# Patient Record
Sex: Female | Born: 1965 | Race: Black or African American | Hispanic: No | Marital: Single | Smoking: Current every day smoker
Health system: Southern US, Community
[De-identification: ages and names within clinical notes are randomized; demographics above are authoritative.]

## PROBLEM LIST (undated history)

## (undated) DIAGNOSIS — E042 Nontoxic multinodular goiter: Secondary | ICD-10-CM

## (undated) DIAGNOSIS — I82409 Acute embolism and thrombosis of unspecified deep veins of unspecified lower extremity: Secondary | ICD-10-CM

## (undated) DIAGNOSIS — I10 Essential (primary) hypertension: Secondary | ICD-10-CM

## (undated) DIAGNOSIS — Q0700 Arnold-Chiari syndrome without spina bifida or hydrocephalus: Secondary | ICD-10-CM

## (undated) DIAGNOSIS — B20 Human immunodeficiency virus [HIV] disease: Secondary | ICD-10-CM

## (undated) HISTORY — PX: BRAIN SURGERY: SHX531

## (undated) HISTORY — PX: CHOLECYSTECTOMY: SHX55

## (undated) HISTORY — DX: Human immunodeficiency virus (HIV) disease: B20

## (undated) HISTORY — PX: ABDOMINAL HYSTERECTOMY: SHX81

## (undated) HISTORY — DX: Nontoxic multinodular goiter: E04.2

---

## 1998-04-28 ENCOUNTER — Emergency Department (HOSPITAL_COMMUNITY): Admission: EM | Admit: 1998-04-28 | Discharge: 1998-04-28 | Payer: Self-pay | Admitting: Emergency Medicine

## 1998-04-28 ENCOUNTER — Encounter: Payer: Self-pay | Admitting: Emergency Medicine

## 1998-04-28 ENCOUNTER — Ambulatory Visit (HOSPITAL_COMMUNITY): Admission: RE | Admit: 1998-04-28 | Discharge: 1998-04-28 | Payer: Self-pay | Admitting: Emergency Medicine

## 1998-05-01 ENCOUNTER — Ambulatory Visit (HOSPITAL_COMMUNITY): Admission: RE | Admit: 1998-05-01 | Discharge: 1998-05-01 | Payer: Self-pay | Admitting: Surgery

## 1998-05-03 ENCOUNTER — Ambulatory Visit (HOSPITAL_COMMUNITY): Admission: RE | Admit: 1998-05-03 | Discharge: 1998-05-03 | Payer: Self-pay | Admitting: Internal Medicine

## 1998-05-09 ENCOUNTER — Emergency Department (HOSPITAL_COMMUNITY): Admission: EM | Admit: 1998-05-09 | Discharge: 1998-05-10 | Payer: Self-pay | Admitting: Emergency Medicine

## 1998-05-09 ENCOUNTER — Encounter: Payer: Self-pay | Admitting: Emergency Medicine

## 1999-03-10 ENCOUNTER — Encounter (INDEPENDENT_AMBULATORY_CARE_PROVIDER_SITE_OTHER): Payer: Self-pay | Admitting: Specialist

## 1999-03-10 ENCOUNTER — Other Ambulatory Visit: Admission: RE | Admit: 1999-03-10 | Discharge: 1999-03-10 | Payer: Self-pay | Admitting: Obstetrics and Gynecology

## 1999-03-17 ENCOUNTER — Inpatient Hospital Stay (HOSPITAL_COMMUNITY): Admission: RE | Admit: 1999-03-17 | Discharge: 1999-03-20 | Payer: Self-pay | Admitting: Obstetrics and Gynecology

## 1999-03-17 ENCOUNTER — Encounter (INDEPENDENT_AMBULATORY_CARE_PROVIDER_SITE_OTHER): Payer: Self-pay | Admitting: *Deleted

## 1999-03-17 ENCOUNTER — Other Ambulatory Visit: Admission: RE | Admit: 1999-03-17 | Discharge: 1999-03-17 | Payer: Self-pay | Admitting: *Deleted

## 1999-03-17 ENCOUNTER — Encounter (INDEPENDENT_AMBULATORY_CARE_PROVIDER_SITE_OTHER): Payer: Self-pay | Admitting: Specialist

## 1999-04-04 ENCOUNTER — Emergency Department (HOSPITAL_COMMUNITY): Admission: EM | Admit: 1999-04-04 | Discharge: 1999-04-04 | Payer: Self-pay | Admitting: Emergency Medicine

## 1999-04-04 ENCOUNTER — Encounter: Payer: Self-pay | Admitting: Emergency Medicine

## 1999-05-14 ENCOUNTER — Inpatient Hospital Stay (HOSPITAL_COMMUNITY): Admission: RE | Admit: 1999-05-14 | Discharge: 1999-05-19 | Payer: Self-pay | Admitting: Neurosurgery

## 1999-12-04 ENCOUNTER — Ambulatory Visit (HOSPITAL_COMMUNITY): Admission: RE | Admit: 1999-12-04 | Discharge: 1999-12-04 | Payer: Self-pay | Admitting: Family Medicine

## 2001-10-25 ENCOUNTER — Encounter: Payer: Self-pay | Admitting: Family Medicine

## 2001-10-25 ENCOUNTER — Encounter: Admission: RE | Admit: 2001-10-25 | Discharge: 2001-10-25 | Payer: Self-pay | Admitting: Family Medicine

## 2004-01-10 ENCOUNTER — Encounter (INDEPENDENT_AMBULATORY_CARE_PROVIDER_SITE_OTHER): Payer: Self-pay | Admitting: *Deleted

## 2004-01-10 ENCOUNTER — Encounter: Admission: RE | Admit: 2004-01-10 | Discharge: 2004-01-10 | Payer: Self-pay | Admitting: Infectious Diseases

## 2004-01-10 ENCOUNTER — Ambulatory Visit (HOSPITAL_COMMUNITY): Admission: RE | Admit: 2004-01-10 | Discharge: 2004-01-10 | Payer: Self-pay | Admitting: Infectious Diseases

## 2004-01-10 LAB — CONVERTED CEMR LAB: CD4 Count: 20 microliters

## 2004-02-04 ENCOUNTER — Encounter: Admission: RE | Admit: 2004-02-04 | Discharge: 2004-02-04 | Payer: Self-pay | Admitting: Infectious Diseases

## 2004-02-20 ENCOUNTER — Ambulatory Visit: Payer: Self-pay | Admitting: Infectious Diseases

## 2004-02-26 ENCOUNTER — Ambulatory Visit: Payer: Self-pay | Admitting: Infectious Diseases

## 2004-03-17 ENCOUNTER — Ambulatory Visit: Payer: Self-pay | Admitting: Infectious Diseases

## 2004-03-17 ENCOUNTER — Ambulatory Visit (HOSPITAL_COMMUNITY): Admission: RE | Admit: 2004-03-17 | Discharge: 2004-03-17 | Payer: Self-pay | Admitting: Infectious Diseases

## 2004-05-20 ENCOUNTER — Ambulatory Visit: Payer: Self-pay | Admitting: Internal Medicine

## 2004-05-28 ENCOUNTER — Ambulatory Visit (HOSPITAL_COMMUNITY): Admission: RE | Admit: 2004-05-28 | Discharge: 2004-05-28 | Payer: Self-pay | Admitting: Infectious Diseases

## 2004-05-28 ENCOUNTER — Ambulatory Visit: Payer: Self-pay | Admitting: Infectious Diseases

## 2004-07-28 ENCOUNTER — Ambulatory Visit (HOSPITAL_COMMUNITY): Admission: RE | Admit: 2004-07-28 | Discharge: 2004-07-28 | Payer: Self-pay | Admitting: Infectious Diseases

## 2004-07-28 ENCOUNTER — Ambulatory Visit: Payer: Self-pay | Admitting: Infectious Diseases

## 2004-10-29 ENCOUNTER — Ambulatory Visit (HOSPITAL_COMMUNITY): Admission: RE | Admit: 2004-10-29 | Discharge: 2004-10-29 | Payer: Self-pay | Admitting: Infectious Diseases

## 2004-10-29 ENCOUNTER — Ambulatory Visit: Payer: Self-pay | Admitting: Infectious Diseases

## 2004-12-07 ENCOUNTER — Emergency Department (HOSPITAL_COMMUNITY): Admission: EM | Admit: 2004-12-07 | Discharge: 2004-12-07 | Payer: Self-pay | Admitting: Emergency Medicine

## 2005-01-21 ENCOUNTER — Ambulatory Visit: Payer: Self-pay | Admitting: Infectious Diseases

## 2005-01-21 ENCOUNTER — Ambulatory Visit (HOSPITAL_COMMUNITY): Admission: RE | Admit: 2005-01-21 | Discharge: 2005-01-21 | Payer: Self-pay | Admitting: Infectious Diseases

## 2005-01-23 ENCOUNTER — Ambulatory Visit (HOSPITAL_COMMUNITY): Admission: RE | Admit: 2005-01-23 | Discharge: 2005-01-23 | Payer: Self-pay | Admitting: Infectious Diseases

## 2005-05-18 ENCOUNTER — Ambulatory Visit: Payer: Self-pay | Admitting: Infectious Diseases

## 2005-05-18 ENCOUNTER — Ambulatory Visit (HOSPITAL_COMMUNITY): Admission: RE | Admit: 2005-05-18 | Discharge: 2005-05-18 | Payer: Self-pay | Admitting: Infectious Diseases

## 2005-05-18 ENCOUNTER — Encounter (INDEPENDENT_AMBULATORY_CARE_PROVIDER_SITE_OTHER): Payer: Self-pay | Admitting: *Deleted

## 2005-05-18 LAB — CONVERTED CEMR LAB: CD4 Count: 410 microliters

## 2005-12-14 ENCOUNTER — Ambulatory Visit: Payer: Self-pay | Admitting: Infectious Diseases

## 2005-12-14 ENCOUNTER — Encounter: Admission: RE | Admit: 2005-12-14 | Discharge: 2005-12-14 | Payer: Self-pay | Admitting: Infectious Diseases

## 2005-12-14 ENCOUNTER — Encounter (INDEPENDENT_AMBULATORY_CARE_PROVIDER_SITE_OTHER): Payer: Self-pay | Admitting: *Deleted

## 2005-12-14 LAB — CONVERTED CEMR LAB
CD4 Count: 400 microliters
HIV 1 RNA Quant: 399 copies/mL

## 2006-04-14 DIAGNOSIS — F329 Major depressive disorder, single episode, unspecified: Secondary | ICD-10-CM

## 2006-04-14 DIAGNOSIS — K219 Gastro-esophageal reflux disease without esophagitis: Secondary | ICD-10-CM | POA: Insufficient documentation

## 2006-04-14 DIAGNOSIS — B20 Human immunodeficiency virus [HIV] disease: Secondary | ICD-10-CM | POA: Insufficient documentation

## 2006-04-14 DIAGNOSIS — F411 Generalized anxiety disorder: Secondary | ICD-10-CM | POA: Insufficient documentation

## 2006-04-14 DIAGNOSIS — I10 Essential (primary) hypertension: Secondary | ICD-10-CM | POA: Insufficient documentation

## 2006-04-14 DIAGNOSIS — F32A Depression, unspecified: Secondary | ICD-10-CM | POA: Insufficient documentation

## 2006-04-14 DIAGNOSIS — F17200 Nicotine dependence, unspecified, uncomplicated: Secondary | ICD-10-CM | POA: Insufficient documentation

## 2006-04-19 ENCOUNTER — Ambulatory Visit: Payer: Self-pay | Admitting: Infectious Diseases

## 2006-04-19 ENCOUNTER — Encounter (INDEPENDENT_AMBULATORY_CARE_PROVIDER_SITE_OTHER): Payer: Self-pay | Admitting: *Deleted

## 2006-04-19 ENCOUNTER — Encounter: Admission: RE | Admit: 2006-04-19 | Discharge: 2006-04-19 | Payer: Self-pay | Admitting: Infectious Diseases

## 2006-04-19 LAB — CONVERTED CEMR LAB: HIV 1 RNA Quant: 62 copies/mL

## 2006-07-14 DIAGNOSIS — Z9889 Other specified postprocedural states: Secondary | ICD-10-CM | POA: Insufficient documentation

## 2006-07-29 ENCOUNTER — Encounter: Admission: RE | Admit: 2006-07-29 | Discharge: 2006-07-29 | Payer: Self-pay | Admitting: Infectious Diseases

## 2006-07-29 ENCOUNTER — Ambulatory Visit: Payer: Self-pay | Admitting: Infectious Diseases

## 2006-07-29 ENCOUNTER — Encounter: Payer: Self-pay | Admitting: Infectious Diseases

## 2006-07-29 DIAGNOSIS — M1710 Unilateral primary osteoarthritis, unspecified knee: Secondary | ICD-10-CM | POA: Insufficient documentation

## 2006-07-29 DIAGNOSIS — IMO0002 Reserved for concepts with insufficient information to code with codable children: Secondary | ICD-10-CM

## 2006-07-29 LAB — CONVERTED CEMR LAB
Albumin: 4 g/dL (ref 3.5–5.2)
BUN: 8 mg/dL (ref 6–23)
CD4 Count: 750 microliters
CO2: 24 meq/L (ref 19–32)
Glucose, Bld: 86 mg/dL (ref 70–99)
HCT: 41.7 % (ref 36.0–46.0)
HIV 1 RNA Quant: <50 copies/mL (ref <50)
HIV-1 RNA Quant, Log: <1.7 (ref <1.70)
Potassium: 4.6 meq/L (ref 3.5–5.3)
Sodium: 143 meq/L (ref 135–145)
Total Bilirubin: 0.3 mg/dL (ref 0.3–1.2)
Triglycerides: 127 mg/dL (ref <150)
VLDL: 25 mg/dL (ref 0–40)

## 2006-08-02 ENCOUNTER — Encounter (INDEPENDENT_AMBULATORY_CARE_PROVIDER_SITE_OTHER): Payer: Self-pay | Admitting: *Deleted

## 2006-08-02 LAB — CONVERTED CEMR LAB

## 2006-08-15 ENCOUNTER — Encounter (INDEPENDENT_AMBULATORY_CARE_PROVIDER_SITE_OTHER): Payer: Self-pay | Admitting: *Deleted

## 2006-09-02 ENCOUNTER — Encounter: Payer: Self-pay | Admitting: Infectious Diseases

## 2006-11-12 ENCOUNTER — Ambulatory Visit (HOSPITAL_COMMUNITY): Admission: RE | Admit: 2006-11-12 | Discharge: 2006-11-12 | Payer: Self-pay | Admitting: Internal Medicine

## 2006-11-12 ENCOUNTER — Ambulatory Visit: Payer: Self-pay | Admitting: Internal Medicine

## 2006-11-12 DIAGNOSIS — M25562 Pain in left knee: Secondary | ICD-10-CM | POA: Insufficient documentation

## 2006-11-12 DIAGNOSIS — M25569 Pain in unspecified knee: Secondary | ICD-10-CM | POA: Insufficient documentation

## 2007-01-10 ENCOUNTER — Encounter: Payer: Self-pay | Admitting: Infectious Diseases

## 2007-01-10 ENCOUNTER — Ambulatory Visit (HOSPITAL_COMMUNITY): Admission: RE | Admit: 2007-01-10 | Discharge: 2007-01-10 | Payer: Self-pay | Admitting: Sports Medicine

## 2007-03-23 ENCOUNTER — Encounter: Admission: RE | Admit: 2007-03-23 | Discharge: 2007-03-23 | Payer: Self-pay | Admitting: Infectious Diseases

## 2007-03-23 ENCOUNTER — Ambulatory Visit: Payer: Self-pay | Admitting: Infectious Diseases

## 2007-03-23 ENCOUNTER — Encounter (INDEPENDENT_AMBULATORY_CARE_PROVIDER_SITE_OTHER): Payer: Self-pay | Admitting: *Deleted

## 2007-03-23 LAB — CONVERTED CEMR LAB
BUN: 7 mg/dL (ref 6–23)
Chloride: 107 meq/L (ref 96–112)
Glucose, Bld: 90 mg/dL (ref 70–99)
HCT: 42.8 % (ref 36.0–46.0)
HIV-1 RNA Quant, Log: 2.38 — ABNORMAL HIGH (ref <1.70)
Hemoglobin: 14.8 g/dL (ref 12.0–15.0)
MCHC: 34.6 g/dL (ref 30.0–36.0)
MCV: 83.9 fL (ref 78.0–100.0)
Platelets: 246 10*3/uL (ref 150–400)
Potassium: 4.1 meq/L (ref 3.5–5.3)
Sodium: 140 meq/L (ref 135–145)

## 2007-05-23 ENCOUNTER — Telehealth: Payer: Self-pay | Admitting: Infectious Diseases

## 2007-08-16 ENCOUNTER — Ambulatory Visit: Payer: Self-pay | Admitting: Internal Medicine

## 2007-08-16 ENCOUNTER — Encounter: Admission: RE | Admit: 2007-08-16 | Discharge: 2007-08-16 | Payer: Self-pay | Admitting: Internal Medicine

## 2007-08-16 LAB — CONVERTED CEMR LAB
AST: 14 units/L (ref 0–37)
Alkaline Phosphatase: 93 units/L (ref 39–117)
Basophils Absolute: 0 10*3/uL (ref 0.0–0.1)
CO2: 24 meq/L (ref 19–32)
Calcium: 9.3 mg/dL (ref 8.4–10.5)
Cholesterol: 207 mg/dL — ABNORMAL HIGH (ref 0–200)
Glucose, Bld: 96 mg/dL (ref 70–99)
HCT: 41.1 % (ref 36.0–46.0)
HIV 1 RNA Quant: 254 copies/mL — ABNORMAL HIGH (ref <50)
Hemoglobin, Urine: NEGATIVE
Hemoglobin: 13.7 g/dL (ref 12.0–15.0)
Leukocytes, UA: NEGATIVE
Lymphocytes Relative: 47 % — ABNORMAL HIGH (ref 12–46)
Lymphs Abs: 3.5 10*3/uL (ref 0.7–4.0)
MCV: 86.3 fL (ref 78.0–100.0)
Neutrophils Relative %: 41 % — ABNORMAL LOW (ref 43–77)
Platelets: 250 10*3/uL (ref 150–400)
Potassium: 4.3 meq/L (ref 3.5–5.3)
Protein, ur: NEGATIVE mg/dL
RBC: 4.76 M/uL (ref 3.87–5.11)
RDW: 14.4 % (ref 11.5–15.5)
Sodium: 140 meq/L (ref 135–145)
Specific Gravity, Urine: 1.01 (ref 1.005–1.03)
Total CHOL/HDL Ratio: 3.4
Triglycerides: 108 mg/dL (ref <150)
Urobilinogen, UA: 0.2 (ref 0.0–1.0)
VLDL: 22 mg/dL (ref 0–40)

## 2007-08-29 ENCOUNTER — Ambulatory Visit: Payer: Self-pay | Admitting: Infectious Diseases

## 2007-08-30 ENCOUNTER — Encounter (INDEPENDENT_AMBULATORY_CARE_PROVIDER_SITE_OTHER): Payer: Self-pay | Admitting: *Deleted

## 2007-12-13 ENCOUNTER — Ambulatory Visit: Payer: Self-pay | Admitting: Infectious Diseases

## 2007-12-13 ENCOUNTER — Encounter: Admission: RE | Admit: 2007-12-13 | Discharge: 2007-12-13 | Payer: Self-pay | Admitting: Infectious Diseases

## 2007-12-13 LAB — CONVERTED CEMR LAB
Basophils Relative: 0 % (ref 0–1)
Calcium: 8.6 mg/dL (ref 8.4–10.5)
Chloride: 109 meq/L (ref 96–112)
Eosinophils Relative: 1 % (ref 0–5)
HCT: 42 % (ref 36.0–46.0)
HIV 1 RNA Quant: <50 copies/mL (ref <50)
HIV-1 RNA Quant, Log: <1.7 (ref <1.70)
Hemoglobin: 14.4 g/dL (ref 12.0–15.0)
Lymphocytes Relative: 50 % — ABNORMAL HIGH (ref 12–46)
MCHC: 34.3 g/dL (ref 30.0–36.0)
MCV: 86.1 fL (ref 78.0–100.0)
Monocytes Relative: 9 % (ref 3–12)
Neutro Abs: 2.1 10*3/uL (ref 1.7–7.7)
Potassium: 4.4 meq/L (ref 3.5–5.3)
RBC: 4.88 M/uL (ref 3.87–5.11)
Sodium: 141 meq/L (ref 135–145)
Total Protein: 6.8 g/dL (ref 6.0–8.3)

## 2007-12-16 ENCOUNTER — Telehealth (INDEPENDENT_AMBULATORY_CARE_PROVIDER_SITE_OTHER): Payer: Self-pay | Admitting: *Deleted

## 2007-12-28 ENCOUNTER — Ambulatory Visit: Payer: Self-pay | Admitting: Infectious Diseases

## 2008-04-09 ENCOUNTER — Ambulatory Visit: Payer: Self-pay | Admitting: Infectious Diseases

## 2008-04-09 LAB — CONVERTED CEMR LAB
Alkaline Phosphatase: 88 units/L (ref 39–117)
Basophils Relative: 1 % (ref 0–1)
CO2: 21 meq/L (ref 19–32)
Chloride: 108 meq/L (ref 96–112)
Creatinine, Ser: 0.76 mg/dL (ref 0.40–1.20)
Eosinophils Absolute: 0.1 10*3/uL (ref 0.0–0.7)
Eosinophils Relative: 1 % (ref 0–5)
HIV 1 RNA Quant: 253 copies/mL — ABNORMAL HIGH (ref <50)
MCHC: 34.5 g/dL (ref 30.0–36.0)
MCV: 86.7 fL (ref 78.0–100.0)
Monocytes Absolute: 0.7 10*3/uL (ref 0.1–1.0)
Neutrophils Relative %: 49 % (ref 43–77)
Platelets: 256 10*3/uL (ref 150–400)
Potassium: 4.2 meq/L (ref 3.5–5.3)
Sodium: 141 meq/L (ref 135–145)
Total Bilirubin: 0.3 mg/dL (ref 0.3–1.2)

## 2008-04-26 ENCOUNTER — Ambulatory Visit: Payer: Self-pay | Admitting: Infectious Diseases

## 2008-04-26 DIAGNOSIS — R358 Other polyuria: Secondary | ICD-10-CM

## 2008-04-26 DIAGNOSIS — R3589 Other polyuria: Secondary | ICD-10-CM | POA: Insufficient documentation

## 2008-04-26 LAB — CONVERTED CEMR LAB
Bilirubin Urine: NEGATIVE
Hemoglobin, Urine: NEGATIVE
Nitrite: NEGATIVE
Urobilinogen, UA: 0.2 (ref 0.0–1.0)
pH: 6 (ref 5.0–8.0)

## 2008-04-27 ENCOUNTER — Encounter: Payer: Self-pay | Admitting: Infectious Diseases

## 2008-08-15 ENCOUNTER — Ambulatory Visit: Payer: Self-pay | Admitting: Infectious Diseases

## 2008-08-15 LAB — CONVERTED CEMR LAB
Albumin: 4 g/dL (ref 3.5–5.2)
Band Neutrophils: 0 % (ref 0–10)
Basophils Absolute: 0 10*3/uL (ref 0.0–0.1)
Basophils Relative: 1 % (ref 0–1)
Chloride: 107 meq/L (ref 96–112)
Eosinophils Relative: 2 % (ref 0–5)
Glucose, Bld: 91 mg/dL (ref 70–99)
HCT: 41.6 % (ref 36.0–46.0)
HDL: 52 mg/dL (ref >39)
HIV 1 RNA Quant: 435 copies/mL — ABNORMAL HIGH (ref <48)
LDL Cholesterol: 121 mg/dL — ABNORMAL HIGH (ref 0–99)
Lymphs Abs: 3.3 10*3/uL (ref 0.7–4.0)
MCV: 85.8 fL (ref 78.0–100.0)
Monocytes Relative: 11 % (ref 3–12)
Neutrophils Relative %: 34 % — ABNORMAL LOW (ref 43–77)
Platelets: 249 10*3/uL (ref 150–400)
Sodium: 141 meq/L (ref 135–145)
Total CHOL/HDL Ratio: 3.7
Total Protein: 6.9 g/dL (ref 6.0–8.3)
WBC: 6.3 10*3/uL (ref 4.0–10.5)

## 2008-08-29 ENCOUNTER — Ambulatory Visit: Payer: Self-pay | Admitting: Infectious Diseases

## 2008-09-24 ENCOUNTER — Encounter: Payer: Self-pay | Admitting: Infectious Diseases

## 2008-09-24 ENCOUNTER — Ambulatory Visit: Payer: Self-pay | Admitting: Infectious Diseases

## 2008-09-24 LAB — CONVERTED CEMR LAB

## 2008-11-27 ENCOUNTER — Encounter: Payer: Self-pay | Admitting: Infectious Diseases

## 2008-12-28 ENCOUNTER — Encounter: Payer: Self-pay | Admitting: Infectious Diseases

## 2009-01-18 ENCOUNTER — Encounter (INDEPENDENT_AMBULATORY_CARE_PROVIDER_SITE_OTHER): Payer: Self-pay | Admitting: *Deleted

## 2009-01-21 ENCOUNTER — Ambulatory Visit: Payer: Self-pay | Admitting: Infectious Diseases

## 2009-01-21 LAB — CONVERTED CEMR LAB
Basophils Absolute: 0 10*3/uL (ref 0.0–0.1)
Calcium: 9.3 mg/dL (ref 8.4–10.5)
Creatinine, Ser: 0.8 mg/dL (ref 0.40–1.20)
Eosinophils Relative: 1 % (ref 0–5)
Glucose, Bld: 114 mg/dL — ABNORMAL HIGH (ref 70–99)
Lymphocytes Relative: 46 % (ref 12–46)
MCHC: 34.2 g/dL (ref 30.0–36.0)
Neutrophils Relative %: 40 % — ABNORMAL LOW (ref 43–77)
Platelets: 214 10*3/uL (ref 150–400)
Potassium: 4 meq/L (ref 3.5–5.3)
RBC: 4.73 M/uL (ref 3.87–5.11)
Sodium: 142 meq/L (ref 135–145)
Total Bilirubin: 0.2 mg/dL — ABNORMAL LOW (ref 0.3–1.2)

## 2009-03-13 ENCOUNTER — Ambulatory Visit: Payer: Self-pay | Admitting: Infectious Diseases

## 2009-03-13 LAB — CONVERTED CEMR LAB
BUN: 7 mg/dL (ref 6–23)
CO2: 25 meq/L (ref 19–32)
Chloride: 107 meq/L (ref 96–112)
Glucose, Bld: 95 mg/dL (ref 70–99)
Potassium: 4.3 meq/L (ref 3.5–5.3)
Sodium: 142 meq/L (ref 135–145)

## 2009-03-19 ENCOUNTER — Ambulatory Visit (HOSPITAL_COMMUNITY): Admission: RE | Admit: 2009-03-19 | Discharge: 2009-03-19 | Payer: Self-pay | Admitting: Infectious Diseases

## 2009-03-19 ENCOUNTER — Encounter: Payer: Self-pay | Admitting: Infectious Diseases

## 2009-03-21 ENCOUNTER — Encounter: Payer: Self-pay | Admitting: Infectious Diseases

## 2009-03-21 ENCOUNTER — Telehealth (INDEPENDENT_AMBULATORY_CARE_PROVIDER_SITE_OTHER): Payer: Self-pay | Admitting: *Deleted

## 2009-07-09 ENCOUNTER — Encounter: Payer: Self-pay | Admitting: Infectious Diseases

## 2009-07-22 ENCOUNTER — Encounter: Admission: RE | Admit: 2009-07-22 | Discharge: 2009-07-22 | Payer: Self-pay | Admitting: Internal Medicine

## 2009-08-02 ENCOUNTER — Telehealth (INDEPENDENT_AMBULATORY_CARE_PROVIDER_SITE_OTHER): Payer: Self-pay | Admitting: *Deleted

## 2009-08-20 ENCOUNTER — Encounter: Admission: RE | Admit: 2009-08-20 | Discharge: 2009-08-20 | Payer: Self-pay | Admitting: Internal Medicine

## 2009-08-20 ENCOUNTER — Other Ambulatory Visit: Admission: RE | Admit: 2009-08-20 | Discharge: 2009-08-20 | Payer: Self-pay | Admitting: Interventional Radiology

## 2009-09-24 ENCOUNTER — Encounter (HOSPITAL_COMMUNITY): Admission: RE | Admit: 2009-09-24 | Discharge: 2009-12-06 | Payer: Self-pay | Admitting: Internal Medicine

## 2009-11-12 ENCOUNTER — Encounter: Payer: Self-pay | Admitting: Infectious Diseases

## 2009-11-22 ENCOUNTER — Ambulatory Visit: Payer: Self-pay | Admitting: Infectious Diseases

## 2009-11-22 LAB — CONVERTED CEMR LAB
AST: 15 units/L (ref 0–37)
Alkaline Phosphatase: 89 units/L (ref 39–117)
Basophils Absolute: 0 10*3/uL (ref 0.0–0.1)
Calcium: 9.4 mg/dL (ref 8.4–10.5)
Chloride: 107 meq/L (ref 96–112)
Glucose, Bld: 110 mg/dL — ABNORMAL HIGH (ref 70–99)
HCT: 41 % (ref 36.0–46.0)
Hemoglobin: 14 g/dL (ref 12.0–15.0)
Monocytes Absolute: 0.6 10*3/uL (ref 0.1–1.0)
Neutrophils Relative %: 42 % — ABNORMAL LOW (ref 43–77)
Platelets: 236 10*3/uL (ref 150–400)
Potassium: 3.8 meq/L (ref 3.5–5.3)
Sodium: 141 meq/L (ref 135–145)
WBC: 7 10*3/uL (ref 4.0–10.5)

## 2009-12-10 ENCOUNTER — Ambulatory Visit: Payer: Self-pay | Admitting: Infectious Diseases

## 2010-03-05 ENCOUNTER — Emergency Department (HOSPITAL_BASED_OUTPATIENT_CLINIC_OR_DEPARTMENT_OTHER): Admission: EM | Admit: 2010-03-05 | Discharge: 2010-03-05 | Payer: Self-pay | Admitting: Emergency Medicine

## 2010-03-05 ENCOUNTER — Ambulatory Visit: Payer: Self-pay | Admitting: Diagnostic Radiology

## 2010-03-07 ENCOUNTER — Encounter: Payer: Self-pay | Admitting: Infectious Diseases

## 2010-04-17 ENCOUNTER — Ambulatory Visit: Payer: Self-pay | Admitting: Infectious Diseases

## 2010-04-17 LAB — CONVERTED CEMR LAB
ALT: 16 units/L (ref 0–35)
AST: 15 units/L (ref 0–37)
Alkaline Phosphatase: 82 units/L (ref 39–117)
BUN: 9 mg/dL (ref 6–23)
Basophils Absolute: 0 10*3/uL (ref 0.0–0.1)
Basophils Relative: 0 % (ref 0–1)
Calcium: 9.3 mg/dL (ref 8.4–10.5)
Creatinine, Ser: 0.93 mg/dL (ref 0.40–1.20)
Eosinophils Absolute: 0.1 10*3/uL (ref 0.0–0.7)
Eosinophils Relative: 1 % (ref 0–5)
HCT: 39.8 % (ref 36.0–46.0)
Lymphocytes Relative: 47 % — ABNORMAL HIGH (ref 12–46)
MCV: 85.8 fL (ref 78.0–100.0)
Monocytes Relative: 8 % (ref 3–12)
Neutro Abs: 3.1 10*3/uL (ref 1.7–7.7)
Total Protein: 6.8 g/dL (ref 6.0–8.3)

## 2010-04-30 ENCOUNTER — Encounter (INDEPENDENT_AMBULATORY_CARE_PROVIDER_SITE_OTHER): Payer: Self-pay | Admitting: *Deleted

## 2010-05-05 ENCOUNTER — Ambulatory Visit: Payer: Self-pay | Admitting: Infectious Diseases

## 2010-05-23 ENCOUNTER — Ambulatory Visit: Payer: Self-pay | Admitting: Infectious Diseases

## 2010-06-29 ENCOUNTER — Encounter: Payer: Self-pay | Admitting: Infectious Diseases

## 2010-07-06 LAB — CONVERTED CEMR LAB
Albumin: 4.1 g/dL (ref 3.5–5.2)
Alkaline Phosphatase: 86 units/L (ref 39–117)
BUN: 8 mg/dL (ref 6–23)
Calcium: 9.2 mg/dL (ref 8.4–10.5)
Chloride: 106 meq/L (ref 96–112)
Creatinine, Ser: 0.9 mg/dL (ref 0.40–1.20)
Glucose, Bld: 80 mg/dL (ref 70–99)
HIV-1 RNA Quant, Log: 1.79 — ABNORMAL HIGH (ref <1.70)
Hemoglobin: 14.9 g/dL — ABNORMAL HIGH (ref 11.7–14.8)
MCHC: 34.3 g/dL (ref 33.1–35.4)
MCV: 87.5 fL (ref 78.8–100.0)
Potassium: 4.3 meq/L (ref 3.5–5.3)
RDW: 13.9 % (ref 11.5–15.3)
Sodium: 142 meq/L (ref 135–145)

## 2010-07-10 NOTE — Letter (Signed)
Summary: [facility]  [facility]   Imported By: Florinda Marker 03/14/2010 16:13:22  _____________________________________________________________________  External Attachment:    Type:   Image     Comment:   External Document

## 2010-07-10 NOTE — Assessment & Plan Note (Signed)
Summary: F/U/VS   CC:  follow-up visit, Lift lower shin, really banged her shin, large bruise, and Depression.  History of Present Illness: 45 yo F  HIV+ . last CD4 6300 and VL <20  (04-17-10).  Since last visit has had thyroid bx. Is going to have thyroidectomy. Has not f/u with thyroid scan. Has made some progess with decreasing her smoking.   Depression History:      The patient denies a depressed mood most of the day and a diminished interest in her usual daily activities.         Preventive Screening-Counseling & Management  Alcohol-Tobacco     Alcohol drinks/day: 0     Smoking Status: current     Smoking Cessation Counseling: yes     Smoke Cessation Stage: contemplative     Packs/Day: 0.5  Caffeine-Diet-Exercise     Caffeine use/day: 1  coffee     Does Patient Exercise: yes     Type of exercise: dog walking     Exercise (avg: min/session): 30-60     Times/week: 7  Hep-HIV-STD-Contraception     HIV Risk: risk noted     HIV Risk Counseling: not indicated-no HIV risk noted  Safety-Violence-Falls     Seat Belt Use: yes  Comments: declined condoms      Sexual History:  n/a.        Drug Use:  never.     Updated Prior Medication List: ATRIPLA 600-200-300 MG TABS (EFAVIRENZ-EMTRICITAB-TENOFOVIR) Take 1 tablet by mouth once a day  Current Allergies (reviewed today): No known allergies  Social History: Sexual History:  n/a Drug Use:  never  Additional History Menstrual Status:  postmenopausal  Vital Signs:  Patient profile:   45 year old female Menstrual status:  postmenopausal Height:      62 inches (157.48 cm) Weight:      206.25 pounds (93.75 kg) BMI:     37.86 Temp:     98.6 degrees F (37.00 degrees C) oral Pulse rate:   89 / minute BP sitting:   145 / 92  (left arm) Cuff size:   large  Vitals Entered By: Jennet Maduro RN (May 05, 2010 9:37 AM) CC: follow-up visit, Lift lower shin, really banged her shin, large bruise, Depression Is  Patient Diabetic? No Pain Assessment Patient in pain? no      Nutritional Status BMI of > 30 = obese Nutritional Status Detail appetite "greatr"  Have you ever been in a relationship where you felt threatened, hurt or afraid?No   Does patient need assistance? Functional Status Self care Ambulation Normal Comments mays have missed 4 doses of rxes in the past 5 months     Menstrual Status postmenopausal Last PAP Result HYST due to fibroids, 2000        Medication Adherence: 05/05/2010   Adherence to medications reviewed with patient. Counseling to provide adequate adherence provided   Prevention For Positives: 05/05/2010   Safe sex practices discussed with patient. Condoms offered.                             Physical Exam  General:  well-developed, well-nourished, well-hydrated, and overweight-appearing.   Eyes:  pupils equal, pupils round, and pupils reactive to light.   Mouth:  pharynx pink and moist and no exudates.   Neck:  no masses.   Lungs:  normal respiratory effort and normal breath sounds.   Heart:  normal rate, regular  rhythm, and no murmur.   Abdomen:  soft, non-tender, and normal bowel sounds.     Impression & Recommendations:  Problem # 1:  HIV DISEASE (ICD-042) she is doing well. her CD4 has come down some but she still is und. offered condoms. will sched for PAP and mammo. flu today. return to clinic 4-5 months.   Problem # 2:  HYPERTENSION (ICD-401.9) she has quit taking this. has dedicated herself to diet and exercise, wt loss. will f/u.  The following medications were removed from the medication list:    Hydrochlorothiazide 25 Mg Tabs (Hydrochlorothiazide) .Marland Kitchen... Take one (1) by mouth daily  Problem # 3:  TOBACCO ABUSE (ICD-305.1) is trying to quit. will cont to encourage her.   Other Orders: Influenza Vaccine NON MCR (81191) Est. Patient Level IV (47829) Future Orders: T-CD4SP ([facility]) (CD4SP) ... 08/03/2010 T-HIV Viral Load  [id number]) ... 08/03/2010 T-Comprehensive Metabolic Panel [id number]) ... 08/03/2010 T-CBC w/Diff ([id number]) ... 08/03/2010 T-RPR (Syphilis) [phone]) ... 08/03/2010 T-Lipid Profile [phone]) ... 08/03/2010         Medication Adherence: 05/05/2010   Adherence to medications reviewed with patient. Counseling to provide adequate adherence provided    Prevention For Positives: 05/05/2010   Safe sex practices discussed with patient. Condoms offered.                             Influenza Vaccine    Vaccine Type: Fluvax Non-MCR    Site: left deltoid    Mfr: novartis    Dose: 0.5 ml    Route: IM    Given by: Jennet Maduro RN    Exp. Date: 09/07/2010    Lot #: [id number]  Flu Vaccine Consent Questions    Do you have a history of severe allergic reactions to this vaccine? no    Any prior history of allergic reactions to egg and/or gelatin? no    Do you have a sensitivity to the preservative Thimersol? no    Do you have a past history of Guillan-Barre Syndrome? no    Do you currently have an acute febrile illness? no    Have you ever had a severe reaction to latex? no    Vaccine information given and explained to patient? yes    Are you currently pregnant? no

## 2010-07-10 NOTE — Miscellaneous (Signed)
  Clinical Lists Changes  Observations: Added new observation of YEARAIDSPOS: 2005  (04/30/2010 8:45)

## 2010-07-10 NOTE — Assessment & Plan Note (Signed)
Summary: F/U OV/VS   CC:  f/u ov   OUt of meds for 3 days due to mail order .  History of Present Illness: 45 yo F  HIV+ . last CD4 870 and VL <48  (11-22-09).  Since last visit has had thyroid bx. Is going to have thyroidectomy, has f/u scan in October due to nodules and enlargement.    Preventive Screening-Counseling & Management  Alcohol-Tobacco     Alcohol drinks/day: 0     Smoking Status: current     Smoking Cessation Counseling: yes     Packs/Day: 1.0   Updated Prior Medication List: ATRIPLA 600-200-300 MG TABS (EFAVIRENZ-EMTRICITAB-TENOFOVIR) Take 1 tablet by mouth once a day HYDROCHLOROTHIAZIDE 25 MG TABS (HYDROCHLOROTHIAZIDE) Take one (1) by mouth daily  Current Allergies (reviewed today): No known allergies  Past History:  Past Medical History: Depression HIV disease Hypertension Tobacco abuse Anxiety GERD vaginitis, hx of Thyromegally, nodule  Review of Systems       cont to have bad dreams from her medicine. has not had PAP this year. on her R foot has a sticking pain around her heel. like she stepped on glass. has pain intermittently. better with wearing sneakers, no pain at all with high heel shoes. no knee pain for 6 months.   Vital Signs:  Patient profile:   45 year old female Weight:      217.12 pounds BMI:     39.86 BSA:     1.98 Temp:     98.9 degrees F oral Pulse rate:   101 / minute BP sitting:   136 / 93  (right arm)  Vitals Entered By: Tomasita Morrow RN (December 10, 2009 3:36 PM) CC: f/u ov   OUt of meds for 3 days due to mail order  Is Patient Diabetic? No Pain Assessment Patient in pain? no      Nutritional Status BMI of > 30 = obese Nutritional Status Detail normal   Have you ever been in a relationship where you felt threatened, hurt or afraid?No  Domestic Violence Intervention normal  Does patient need assistance? Functional Status Self care Ambulation Normal   Physical Exam  General:  well-developed, well-nourished,  well-hydrated, and overweight-appearing.   Eyes:  pupils equal, pupils round, and pupils reactive to light.   Mouth:  pharynx pink and moist and no exudates.   Neck:  fullness in lower neck.  Lungs:  normal respiratory effort and normal breath sounds.   Heart:  normal rate, regular rhythm, and no murmur.   Abdomen:  soft, non-tender, and normal bowel sounds.   Extremities:  non pitting edema.         Medication Adherence: 12/10/2009   Adherence to medications reviewed with patient. Counseling to provide adequate adherence provided   Prevention For Positives: 12/10/2009   Safe sex practices discussed with patient. Condoms offered.                             Impression & Recommendations:  Problem # 1:  HIV DISEASE (ICD-042) she is doing well. will cont her current meds. not clear she has neuropathy from meds or from her prev neurologic disease. will return to clinic 4-5 months with labs prior.   Problem # 2:  ARTHRITIS, LEFT KNEE (ICD-716.96) has improved. will cont as needed meds.   Problem # 3:  TOBACCO ABUSE (ICD-305.1) counseled to quit smoking.   Other Orders: Est. Patient Level IV (16109)  Future Orders: T-CD4SP ([facility]) (CD4SP) ... 03/10/2010 T-HIV Viral Load [id number]) ... 03/10/2010 T-Comprehensive Metabolic Panel [id number]) ... 03/10/2010 T-CBC w/Diff ([id number]) ... 03/10/2010         Medication Adherence: 12/10/2009   Adherence to medications reviewed with patient. Counseling to provide adequate adherence provided    Prevention For Positives: 12/10/2009   Safe sex practices discussed with patient. Condoms offered.

## 2010-07-10 NOTE — Consult Note (Signed)
Summary: Dr. Darnell Level  Dr. Darnell Level   Imported By: Florinda Marker 12/05/2009 09:25:01  _____________________________________________________________________  External Attachment:    Type:   Image     Comment:   External Document

## 2010-07-10 NOTE — Consult Note (Signed)
Summary: [facility]   Imported By: Florinda Marker 07/19/2009 14:38:21  _____________________________________________________________________  External Attachment:    Type:   Image     Comment:   External Document

## 2010-07-10 NOTE — Progress Notes (Signed)
Summary: refill/mld  Phone Note Call from Patient   Caller: Patient Reason for Call: Refill Medication Summary of Call: Patient called requesting a prescription refill be called into her mail order pharmacy [facility] at [phone]. Initial call taken by: Paulo Fruit  BS,CPht II,MPH,  August 02, 2009 10:43 AM    Prescriptions: ATRIPLA 600-200-300 MG TABS (EFAVIRENZ-EMTRICITAB-TENOFOVIR) Take 1 tablet by mouth once a day  #90 x 3   Entered by:   Paulo Fruit  BS,CPht II,MPH   Authorized by:   Johny Sax MD   Signed by:   Paulo Fruit  BS,CPht II,MPH on 08/02/2009   Method used:   Telephoned to ...       [facility] (mail-order)             , [place]         Ph:        Fax: [phone]   RxID:   [id number]  Spoke to Freida Busman, [place] who read back the order/mld Paulo Fruit  BS,CPht II,MPH  August 02, 2009 10:50 AM

## 2010-07-10 NOTE — Miscellaneous (Signed)
Summary: Orders Update  Clinical Lists Changes  Orders: Added new Test order of T-CBC w/Diff [phone]) - Signed Added new Test order of T-CD4SP [facility]) (CD4SP) - Signed Added new Test order of T-Comprehensive Metabolic Panel [phone]) - Signed Added new Test order of T-HIV Viral Load [id number]) - Signed     Process Orders Check Orders Results:     [facility]: ABN not required for this insurance Order queued for requisitioning for [facility]: November 22, 2009 9:46 AM  Tests Sent for requisitioning (November 22, 2009 9:46 AM):     11/22/2009: [facility] -- T-CBC w/Diff [[id number]] (signed)     11/22/2009: [facility] -- T-Comprehensive Metabolic Panel [[id number]] (signed)     11/22/2009: [facility] -- T-HIV Viral Load [phone] (signed)

## 2010-07-10 NOTE — Miscellaneous (Signed)
Summary: RW Update  Clinical Lists Changes  Observations: Added new observation of PCTFPL: 185.51  (04/30/2010 12:26) Added new observation of HOUSEINCOME: 14782  (04/30/2010 12:26) Added new observation of FAMILYSIZE: 3  (04/30/2010 12:26) Added new observation of FINASSESSDT: 03/07/2010  (04/30/2010 12:26) Added new observation of YEARLYEXPEN: 1790  (04/30/2010 12:26)

## 2010-08-14 ENCOUNTER — Telehealth: Payer: Self-pay | Admitting: *Deleted

## 2010-08-19 NOTE — Progress Notes (Signed)
  Phone Note Call from Patient   Caller: Patient Summary of Call: needs her aptripla refilled. she is out. states they faxed it here days ago. not found. pharmacist called here & I refilled it verbally Initial call taken by: Golden Circle RN,  August 14, 2010 10:59 AM

## 2010-09-13 LAB — T-HELPER CELL (CD4) - (RCID CLINIC ONLY)
CD4 % Helper T Cell: 24 % — ABNORMAL LOW (ref 33–55)
CD4 T Cell Abs: 890 uL (ref 400–2700)

## 2010-09-18 LAB — T-HELPER CELL (CD4) - (RCID CLINIC ONLY)
CD4 % Helper T Cell: 25 % — ABNORMAL LOW (ref 33–55)
CD4 T Cell Abs: 840 uL (ref 400–2700)

## 2010-10-24 NOTE — H&P (Signed)
[place]. [facility]  Patient:    Shannon [name]                   MRN: [id number] Adm. Date:  [id number] Attending:  Tressie Stalker D CC:         Tammy R. Collins [place], M.D.                         History and Physical  CHIEF COMPLAINT:  Neck pain and headaches.  HISTORY OF PRESENT ILLNESS:  The patient is a 45 year old black female who complained of a several-year history of headaches.  She has had headaches for approximately four years.  She was evaluated by Dr. Geraldo Pitter at that time. She continued to have intermittent headaches, and they worsened significantly in February 2000.  She subsequently saw Dr. Bayard Beaver. Spear.  She was treated with medications including Midrin, but continued to have the headaches.  She was then sent for a brain MRI, which demonstrated a Chiari-I malformation, and she was kindly sent for my consultation.  The patient complains of headaches almost daily.  The pain is localized mainly t the occipital/cervical junction.  It can radiate to the suboccipital region. She has some pain and numbness radiating into her left arm.  She has had some numbness in her left hand.  She feels unsteady in her gait and dizzy.  She has had some roaring in her left ear.  She has had occasional nausea, no vomiting.  She has ad no seizures.  The headaches are not associated with scotomata, etc.  I discussed the situation with her, including the natural history and options for treatment of Chiari-I malformation.  She decided to have surgery.  In the course of her preoperative workup she was found to be anemic and it turned out that she had some significant uterine bleeding, and she therefore underwent a hysterectomy for the treatment of this by Dr. Lyn Hollingshead.  He feels that she has sufficiently healed o have this elective Chiari decompression.  The patient therefore has weighed the  risks, benefits, and alternatives of surgery, and wishes  to proceed with the operation on May 14, 1999.  PAST MEDICAL HISTORY:  Positive for deep vein thrombosis diagnosis in November 1999.  She has a benign splenic "growth" which has been seen on an MRI scan which is considered to be benign.  A remote history of cholecystitis, cholelithiasis.  CURRENT MEDICATIONS:  None.  ALLERGIES:  No known drug allergies.  PAST SURGICAL HISTORY:  Cholecystectomy in 1994.  A hysterectomy in October 2000.  FAMILY HISTORY:  The patients mother is age 91 in fair health, suffering from asthma and hypertension.  The patients father is age 60, in fair health with no  known medical problems.  SOCIAL HISTORY:  The patient is single.  She has a 79-year-old daughter.  She lives in [place].  She is employed as an Physiological scientist.  She smokes 1/2 pack per day of cigarettes since approximately 10 years.  She denies ethanol or drug  use.  REVIEW OF SYSTEMS:  As above.  The patient complains of some occasional leg pain, shortness of breath, stress urinary incontinence, memory problems, depression, anemia.  PHYSICAL EXAMINATION:  GENERAL:  A pleasant moderately-obese 45 year old black female, in no apparent distress.  Height 5 feet 2 inches, weight 205 pounds.  HEENT:  Normocephalic, atraumatic.  Pupils equal, round, reactive to light. Extraocular muscles intact.  Sclerae white.  Conjunctivae pink.  Oropharynx benign. Uvula midline.  NECK:  The patient had a limited range of motion.  She localized her pain to the occipital/cervical junction.  There is no tenderness to palpation.  No deformities.  Spurlings test is negative bilaterally.  Lhermittes sign was not present. There are no cervical masses, meningismus, deformities, tracheal deviation, jugular venous distention, or carotid bruits.  THORAX:  Symmetrical.  LUNGS:  Clear to auscultation without rales, rhonchi, or wheezes.  HEART:  A regular rate and rhythm.  ABDOMEN:  Soft,  nontender.  EXTREMITIES:  Obese, no obvious deformities.  BACK:  Benign.  There is no point tenderness or deformities.  Straight leg raising and Fabere testing is negative bilaterally.  NEUROLOGIC:  The patient is alert and oriented x 3.  Cranial nerves II-XII are grossly intact bilaterally.  Vision and hearing are grossly normal bilaterally.  She wears corrective lenses.  There is no nystagmus.  Sensory examination demonstrates some slight decreased sensation in the left thumb, otherwise normal. Cerebellar examination is intact to rapid alternating movements of the upper extremities bilaterally and finger-to-nose bilaterally.  Deep tendon reflexes are 2/4 in the bilateral biceps, triceps, brachial radialis, 2+/4 in the bilateral quadriceps and gastrocnemius.  She has bilateral flexor plantar reflexes.  No ankle clonus.  Mental status examination is normal.  Cranial nerves:  The patients pupils are 3.0 mm, equal, reactive bilaterally.  DIAGNOSTIC STUDIES:  The patient had a brain MR performed at [facility] on November 19, 1998. Intracranially her brain is normal.  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  The cervical spine is visualized on the sagittal views, and there is no evidence of syringomyelia.  ASSESSMENT: 1. Chiari-I malformation.  PLAN:  I have discussed the situation with the patient.  I have reviewed her MRI scan with her and pointed out the abnormalities.  The patient has vague symptoms but they are consistent with a symptomatic Chiari-I malformation.  I have discussed the various treatment options, including observation with intermittent MRIs and  surgery.  I have described the procedure of a suboccipital craniectomy with C1 nd possible C2 laminectomy, and dural patch grafting.  I have shown her surgical models and have discussed the risks of surgery, and the patient has weighed the  risks, benefits, and alternatives to surgery,  and would like to proceed with the surgery on May 14, 1999.  2. History of uterine bleeding/hysterectomy noted. 3. Past history of deep vein thrombosis noted.  DD:  05/14/99 TD:  05/14/99 Job: [id number]

## 2010-10-24 NOTE — Op Note (Signed)
[place]. [facility]  Patient:    Shannon [name]                   MRN: [id number] Proc. Date: 05/14/99 Adm. Date:  [id number] Attending:  Tressie Stalker D                           Operative Report  BRIEF HISTORY:  The patient is a 45 year old black female who suffers from several years of headaches and pain at the occipital cervical junction with some numbness. She has failed medical management and was worked up as an outpatient with a brain MRI which demonstrated a QRA1 malformation.  I discussed the risks, benefits, and alternatives to surgery and the patient decided to proceed with a posterior fossa decompression.  PREOPERATIVE DIAGNOSIS:  QRA1 malformation.  POSTOPERATIVE DIAGNOSIS:  QRA1 malformation.  PROCEDURE:  Suboccipital craniectomy, C1 laminectomy, and duroplasty using Duragard graft.  SURGEON:  Cristi Loron, M.D.  ASSISTANT:  Hewitt Shorts, M.D.  ANESTHESIA:  General endotracheal anesthesia.  ESTIMATED BLOOD LOSS:  100 cc.  SPECIMENS:  None.  DRAINS:  None.  COMPLICATIONS:  None.  DESCRIPTION OF PROCEDURE:  The patient was brought to the operating room by the  anesthesia team.  The Mayfield three-point head rest was then applied to the patients calvarium.  She was then turned to the prone position on the chest rolls. Her occipital region was then shaved and prepared with Betadine scrub and Betadine solution and sterile drapes were applied.  I then injected the area to be incised with Marcaine with epinephrine solution.  I used a scalpel to make a vertical incision at the occipital cervical junction in the midline.  I used electrocautery to dissect down to the cervical fascia.  I divided it bilaterally performing a subperiosteal dissection, stripping the paraspinous musculature from the spinous process and lamina of the C1, C2, and the occipital bone at the foramen magnum. I used a cerebellar  retractor for exposure.  I then used the Midas-Rex highspeed drill to perform a suboccipital craniectomy.  I drilled the occipital bone at the foramen magnum until there was only a very thin layer of bone left.  I then drilled away the posterior medial aspect of the C1 lamina.  I then completed the craniectomy and the laminectomy using the Kerrison punches.  Of note, there was  considerable fibrosis at the occipital cervical junction around the foramen magnum. I used the microdissectors to dissect through this fibrosis to get down to the dura. I removed the fibrosis with the Kerrison punch.  I then incised the dura n a Y-shaped fashion and then this exposed the underlying arachnoid.  At this point  there was clear communication between the intracranial cavity and the spine.  I  then obtained a Duragard graft (Bovine pericardium) graft.  I fashioned to the appropriate dimensions with the Mayo scissors.  I then secured it to the Y-shaped incision I had made in the dura using interrupted and running 4-0 Neurilon suture. I then copiously irrigated out the wound with bacitracin solution, removed the solution.  I achieved astringent hemostasis with bipolar electrocautery and then I reapproximated the patients cervical fascia with interrupted #1 Vicryl, the subcutaneous tissue with interrupted 2-0 Vicryl, and the skin with stainless steel staples.  The wound was then cleansed with bacitracin solution, coated with bacitracin ointment, and a sterile dressing was applied.  The drapes were removed. She  was returned to the supine position.  The Mayfield three-point head rest was removed and she was subsequently extubated by the anesthesia team and transported to the postanesthesia care unit in stable condition.  All sponge, needle, and instrument counts were correct at the end of this case. DD:  05/14/99 TD:  05/15/99 Job: [id number]

## 2010-10-24 NOTE — Discharge Summary (Signed)
[facility]  Patient:    Shannon [name]                   MRN: [id number] Adm. Date:  [id number] Disc. Date: 05/19/99 Attending:  Tressie Stalker D                           Discharge Summary  For full details of this admission please refer to typed history and physical.  BRIEF HISTORY:  The patient is a 45 year old black female who suffers from several years of headaches and pain at the occipital-cervical junction with some numbness. She has failed medical management and was worked up as an outpatient with a brain MRI which demonstrated a Chiari I malformation.  I discussed the risks, benefits and alternatives of surgery with her and she decided to proceed with a posterior fossa decompression.  PAST MEDICAL HISTORY, PAST SURGICAL HISTORY, MEDICATIONS PRIOR TO ADMISSION, DRUG ALLERGIES, FAMILY MEDICAL HISTORY, SOCIAL HISTORY, ADMISSION PHYSICAL EXAMINATION, IMAGING STUDIES, ASSESSMENT AND PLAN, ETC:  Please refer to the typed history and physical.  HOSPITAL COURSE:  I admitted the patient to [facility] on December 6, 000 with the diagnosis of Chiari I malformation.  On the day of admission, I performed a suboccipital craniectomy with decompression at the foramen magnum and C1 laminectomy with a duraplasty.  The surgery went well without complications (for full details of this operation, please refer to typed operative report.)  POSTOPERATIVE COURSE:  The patients postoperative course was essentially unremarkable.  She was somewhat slow to mobilize but by May 19, 1999, she as afebrile, vital signs stable, she was eating well, ambulating well.  Her wound as healing well without signs of infection.  There was no discharge.  She was ready for discharge to home on May 19, 1999.  DISCHARGE INSTRUCTIONS:  The patient was given written instructions. Instructed to follow up with me in one week for staple  removal.  DISCHARGE MEDICATIONS: 1. Lortab 10, #50, one p.o. q.4h. p.r.n. pain, one refill. 2. Valium 5 mg, #50, one p.o. q.6h. p.r.n. for muscle spasms, one refill.  FINAL DIAGNOSIS:  Chiari I malformation.  PROCEDURE:  Suboccipital craniectomy, C1 laminectomy and duraplasty. DD:  05/19/99 TD:  05/19/99 Job: [id number]

## 2010-12-08 ENCOUNTER — Other Ambulatory Visit (INDEPENDENT_AMBULATORY_CARE_PROVIDER_SITE_OTHER): Payer: 59

## 2010-12-08 DIAGNOSIS — Z79899 Other long term (current) drug therapy: Secondary | ICD-10-CM

## 2010-12-08 DIAGNOSIS — Z113 Encounter for screening for infections with a predominantly sexual mode of transmission: Secondary | ICD-10-CM

## 2010-12-08 DIAGNOSIS — B20 Human immunodeficiency virus [HIV] disease: Secondary | ICD-10-CM

## 2010-12-09 LAB — COMPLETE METABOLIC PANEL WITH GFR
ALT: 12 U/L (ref 0–35)
AST: 16 U/L (ref 0–37)
Albumin: 4 g/dL (ref 3.5–5.2)
Alkaline Phosphatase: 73 U/L (ref 39–117)
Potassium: 3.9 mEq/L (ref 3.5–5.3)
Sodium: 141 mEq/L (ref 135–145)
Total Bilirubin: 0.4 mg/dL (ref 0.3–1.2)
Total Protein: 6.7 g/dL (ref 6.0–8.3)

## 2010-12-09 LAB — LIPID PANEL
Cholesterol: 215 mg/dL — ABNORMAL HIGH (ref 0–200)
LDL Cholesterol: 143 mg/dL — ABNORMAL HIGH (ref 0–99)
Total CHOL/HDL Ratio: 3.9 Ratio
VLDL: 17 mg/dL (ref 0–40)

## 2010-12-09 LAB — CBC WITH DIFFERENTIAL/PLATELET
Basophils Relative: 1 % (ref 0–1)
Eosinophils Absolute: 0.1 10*3/uL (ref 0.0–0.7)
Eosinophils Relative: 1 % (ref 0–5)
HCT: 41 % (ref 36.0–46.0)
Hemoglobin: 14.2 g/dL (ref 12.0–15.0)
MCH: 29.8 pg (ref 26.0–34.0)
MCHC: 34.6 g/dL (ref 30.0–36.0)
MCV: 86.1 fL (ref 78.0–100.0)
Monocytes Absolute: 0.5 10*3/uL (ref 0.1–1.0)
Monocytes Relative: 8 % (ref 3–12)
Neutro Abs: 2.6 10*3/uL (ref 1.7–7.7)
RDW: 15.1 % (ref 11.5–15.5)

## 2010-12-09 LAB — T-HELPER CELL (CD4) - (RCID CLINIC ONLY): CD4 % Helper T Cell: 27 % — ABNORMAL LOW (ref 33–55)

## 2010-12-09 LAB — HIV-1 RNA QUANT-NO REFLEX-BLD: HIV-1 RNA Quant, Log: <1.3 {Log} (ref <1.30)

## 2010-12-22 ENCOUNTER — Ambulatory Visit (INDEPENDENT_AMBULATORY_CARE_PROVIDER_SITE_OTHER): Payer: 59 | Admitting: Infectious Diseases

## 2010-12-22 ENCOUNTER — Encounter: Payer: Self-pay | Admitting: Infectious Diseases

## 2010-12-22 ENCOUNTER — Telehealth: Payer: Self-pay | Admitting: *Deleted

## 2010-12-22 DIAGNOSIS — Z113 Encounter for screening for infections with a predominantly sexual mode of transmission: Secondary | ICD-10-CM

## 2010-12-22 DIAGNOSIS — B20 Human immunodeficiency virus [HIV] disease: Secondary | ICD-10-CM

## 2010-12-22 DIAGNOSIS — I10 Essential (primary) hypertension: Secondary | ICD-10-CM

## 2010-12-22 DIAGNOSIS — F17200 Nicotine dependence, unspecified, uncomplicated: Secondary | ICD-10-CM

## 2010-12-22 DIAGNOSIS — Z79899 Other long term (current) drug therapy: Secondary | ICD-10-CM

## 2010-12-22 NOTE — Telephone Encounter (Signed)
Spoke with Amy at [facility], patient is needing help with the copays for Thyroid surgery.  She is no longer active with them, but she is going to call [facility] and enroll.  They may be able to assist her with talking to the billing department at the hospital or possibly some help with bills so she can afford this surgery. Wendall Mola CMA

## 2010-12-22 NOTE — Assessment & Plan Note (Signed)
She is doing very well. Given condoms. Will try to get her help with her thyroidectomy. Will see her back in 4-6 months. She will come back for flu shot when it is available.

## 2010-12-22 NOTE — Progress Notes (Signed)
  Subjective:    Patient ID: Shannon [name], female    DOB: 05-10-1966, 45 y.o.   MRN: [id number]  HPI 45 yo F HIV+ previous Chair 1 repair (2000).  Last CD4 830 and VL <20 (12-08-10).  Since last visit has had thyroid bx- ? For cancerous cells, goiter. Was going to have thyroidectomy but doesn't have the money for it. Has repeat scan scheduled for December 2012.  Had mammo last week. Is aware she needs a PAP smear.  Is trying to loose weight, down 2# since november. States she is "almost in a relatoinship". Has not disclosed yet.    Review of Systems  Neurological: Positive for headaches.       Objective:   Physical Exam  Constitutional: She appears well-developed and well-nourished.  Eyes: EOM are normal. Pupils are equal, round, and reactive to light.  Neck: Neck supple. Thyromegaly present.  Cardiovascular: Normal rate, regular rhythm and normal heart sounds.   Pulmonary/Chest: Effort normal and breath sounds normal.  Abdominal: Soft. Bowel sounds are normal. She exhibits no distension.  Lymphadenopathy:    She has no cervical adenopathy.          Assessment & Plan:

## 2010-12-22 NOTE — Assessment & Plan Note (Signed)
Encouraged to quit. 

## 2010-12-22 NOTE — Assessment & Plan Note (Signed)
Will continue to watch. If continues to be elevated with start meds.

## 2011-01-02 ENCOUNTER — Telehealth: Payer: Self-pay | Admitting: *Deleted

## 2011-01-02 NOTE — Telephone Encounter (Signed)
Patient notified she has appointment at [facility] for PAP and MAMMO on 02/19/11 at 1:00 pm. Wendall Mola CMA

## 2011-01-06 ENCOUNTER — Encounter: Payer: Self-pay | Admitting: Infectious Diseases

## 2011-02-19 ENCOUNTER — Encounter: Payer: 59 | Admitting: Obstetrics and Gynecology

## 2011-03-02 LAB — T-HELPER CELL (CD4) - (RCID CLINIC ONLY): CD4 % Helper T Cell: 20 — ABNORMAL LOW

## 2011-03-05 LAB — T-HELPER CELL (CD4) - (RCID CLINIC ONLY)
CD4 % Helper T Cell: 24 — ABNORMAL LOW
CD4 T Cell Abs: 670

## 2011-03-10 LAB — T-HELPER CELL (CD4) - (RCID CLINIC ONLY)
CD4 % Helper T Cell: 21 — ABNORMAL LOW
CD4 T Cell Abs: 590

## 2011-03-18 LAB — T-HELPER CELL (CD4) - (RCID CLINIC ONLY): CD4 T Cell Abs: 660

## 2011-03-31 ENCOUNTER — Encounter: Payer: Self-pay | Admitting: Infectious Diseases

## 2011-03-31 ENCOUNTER — Telehealth: Payer: Self-pay | Admitting: *Deleted

## 2011-03-31 ENCOUNTER — Ambulatory Visit (INDEPENDENT_AMBULATORY_CARE_PROVIDER_SITE_OTHER): Payer: 59 | Admitting: Infectious Diseases

## 2011-03-31 VITALS — BP 142/100 | HR 86 | Temp 99.0°F | Ht 62.0 in | Wt 207.0 lb

## 2011-03-31 DIAGNOSIS — R197 Diarrhea, unspecified: Secondary | ICD-10-CM | POA: Insufficient documentation

## 2011-03-31 DIAGNOSIS — B20 Human immunodeficiency virus [HIV] disease: Secondary | ICD-10-CM

## 2011-03-31 NOTE — Telephone Encounter (Signed)
Patient called c/o diarrhea x 3 days, 2-4 bowel movements per day.  States her stool is green and now her tongue is green as well.  Given appointment for today. Wendall Mola CMA

## 2011-03-31 NOTE — Progress Notes (Signed)
  Subjective:    Patient ID: Shannon [name], female    DOB: 07-29-1965, 45 y.o.   MRN: [id number]  HPI 45 yo F HIV+ previous Chairi 1 repair (2000).  Last CD4 830 and VL <20 (12-08-10).  Has been having "mint green to leaf green" diarrhea (2-4x/day) for 3 days. Has been having stomach cramps and queasiness. No emesis but has felt nauseated. Feeling drained and tired. No change in weight.  Food diary- last pm chili with corn bread; previous night chicken with gravy, vegetables; prior saturday ate at restaurant (no other illnesses in contacts at restaurant, family).  Since last visit has had thyroid bx- ? For cancerous cells, goiter. Still waiting for thyroidectomy, finances.    Review of Systems     Objective:   Physical Exam  Constitutional: She appears well-developed and well-nourished.  HENT:  Mouth/Throat: No oropharyngeal exudate.  Eyes: EOM are normal. Pupils are equal, round, and reactive to light.  Neck: Neck supple.  Cardiovascular: Normal rate, regular rhythm and normal heart sounds.   Pulmonary/Chest: Effort normal and breath sounds normal.  Abdominal: Soft. Bowel sounds are normal. She exhibits no distension and no mass. There is no tenderness. There is no rebound and no guarding.  Lymphadenopathy:    She has no cervical adenopathy.          Assessment & Plan:

## 2011-03-31 NOTE — Assessment & Plan Note (Signed)
Will defer checking her labs til her f/u visit. She also wishes to defer her flu shot til then as well. rtc 1 month.

## 2011-03-31 NOTE — Assessment & Plan Note (Signed)
She is encouraged to take imodium, she will call back if she has fever, n/v, unable to keep food down. Will check stool studies on her.

## 2011-04-03 LAB — CLOSTRIDIUM DIFFICILE BY PCR: Toxigenic C. Difficile by PCR: NOT DETECTED

## 2011-04-15 ENCOUNTER — Other Ambulatory Visit: Payer: Self-pay | Admitting: Infectious Disease

## 2011-04-15 ENCOUNTER — Other Ambulatory Visit (INDEPENDENT_AMBULATORY_CARE_PROVIDER_SITE_OTHER): Payer: 59

## 2011-04-15 DIAGNOSIS — B20 Human immunodeficiency virus [HIV] disease: Secondary | ICD-10-CM

## 2011-04-15 DIAGNOSIS — Z79899 Other long term (current) drug therapy: Secondary | ICD-10-CM

## 2011-04-15 DIAGNOSIS — Z113 Encounter for screening for infections with a predominantly sexual mode of transmission: Secondary | ICD-10-CM

## 2011-04-15 LAB — COMPREHENSIVE METABOLIC PANEL
ALT: 18 U/L (ref 0–35)
CO2: 23 mEq/L (ref 19–32)
Calcium: 9.2 mg/dL (ref 8.4–10.5)
Chloride: 108 mEq/L (ref 96–112)
Potassium: 4 mEq/L (ref 3.5–5.3)
Sodium: 139 mEq/L (ref 135–145)
Total Protein: 7 g/dL (ref 6.0–8.3)

## 2011-04-15 LAB — CBC
Hemoglobin: 14.5 g/dL (ref 12.0–15.0)
MCH: 29.4 pg (ref 26.0–34.0)
MCV: 85.4 fL (ref 78.0–100.0)
RBC: 4.93 MIL/uL (ref 3.87–5.11)
WBC: 6.1 10*3/uL (ref 4.0–10.5)

## 2011-04-15 LAB — LIPID PANEL
Cholesterol: 206 mg/dL — ABNORMAL HIGH (ref 0–200)
VLDL: 14 mg/dL (ref 0–40)

## 2011-04-15 LAB — RPR

## 2011-04-16 LAB — T-HELPER CELL (CD4) - (RCID CLINIC ONLY)
CD4 % Helper T Cell: 26 % — ABNORMAL LOW (ref 33–55)
CD4 T Cell Abs: 830 uL (ref 400–2700)

## 2011-04-17 DIAGNOSIS — B20 Human immunodeficiency virus [HIV] disease: Secondary | ICD-10-CM

## 2011-04-17 LAB — HIV-1 RNA QUANT-NO REFLEX-BLD: HIV-1 RNA Quant, Log: <1.3 {Log} (ref <1.30)

## 2011-04-29 ENCOUNTER — Ambulatory Visit (INDEPENDENT_AMBULATORY_CARE_PROVIDER_SITE_OTHER): Payer: 59 | Admitting: Infectious Diseases

## 2011-04-29 ENCOUNTER — Encounter: Payer: Self-pay | Admitting: Infectious Diseases

## 2011-04-29 DIAGNOSIS — R7309 Other abnormal glucose: Secondary | ICD-10-CM

## 2011-04-29 DIAGNOSIS — B20 Human immunodeficiency virus [HIV] disease: Secondary | ICD-10-CM

## 2011-04-29 DIAGNOSIS — Z79899 Other long term (current) drug therapy: Secondary | ICD-10-CM

## 2011-04-29 DIAGNOSIS — R739 Hyperglycemia, unspecified: Secondary | ICD-10-CM | POA: Insufficient documentation

## 2011-04-29 DIAGNOSIS — Z113 Encounter for screening for infections with a predominantly sexual mode of transmission: Secondary | ICD-10-CM

## 2011-04-29 NOTE — Assessment & Plan Note (Signed)
Will check her HgB A1C, if elevated IM referral.

## 2011-04-29 NOTE — Assessment & Plan Note (Signed)
She is doing very well. Will continue her current meds. She is offered condoms. vax uptodate.

## 2011-04-29 NOTE — Progress Notes (Signed)
  Subjective:    Patient ID: Shannon [name], female    DOB: [birth year], 45 y.o.   MRN: [id number]  HPI 45 yo F HIV+ previous Chairi 1 repair (2000). Currently taking atripla. Last CD4 830 and VL <20 (04-15-11). Previous diarrhea has resolved. Previously had thyroid bx- ? For cancerous cells, goiter. Still waiting for thyroidectomy, finances. Is [facility] to get help from [facility]. Wt up 5# from previous. Daughter is worried she has polyuria. Pt thinks she just drinks a lot of water.     Review of Systems  Constitutional: Negative for fever, chills and unexpected weight change.  Gastrointestinal: Negative for diarrhea and constipation.  Genitourinary: Negative for dysuria.       Objective:   Physical Exam  Constitutional: She appears well-developed and well-nourished.  Eyes: EOM are normal. Pupils are equal, round, and reactive to light.  Neck: Neck supple.  Cardiovascular: Normal rate, regular rhythm and normal heart sounds.   Pulmonary/Chest: Effort normal and breath sounds normal.  Abdominal: Soft. Bowel sounds are normal. There is no tenderness.  Lymphadenopathy:    She has no cervical adenopathy.  Skin:             Assessment & Plan:

## 2011-09-08 ENCOUNTER — Other Ambulatory Visit: Payer: Self-pay | Admitting: *Deleted

## 2011-09-08 DIAGNOSIS — B20 Human immunodeficiency virus [HIV] disease: Secondary | ICD-10-CM

## 2011-09-08 MED ORDER — EFAVIRENZ-EMTRICITAB-TENOFOVIR 600-200-300 MG PO TABS: 1.0000 | ORAL_TABLET | Freq: Every day | ORAL | Status: DC

## 2011-09-11 ENCOUNTER — Telehealth: Payer: Self-pay | Admitting: *Deleted

## 2011-09-11 NOTE — Telephone Encounter (Signed)
Patient called concerned that she will run of of her Atripla in 2 days and her mail order pharmacy will not deliver it until 09/15/11.  She is concerned about being out for those 2 days, told her because her labs are so good she should be OK.   Advised her she needed to come in for repeat labs and f/u with provider.  She will call on Monday, to schedule these appointments. Wendall Mola CMA

## 2011-12-07 ENCOUNTER — Other Ambulatory Visit: Payer: 59

## 2011-12-07 DIAGNOSIS — Z113 Encounter for screening for infections with a predominantly sexual mode of transmission: Secondary | ICD-10-CM

## 2011-12-07 DIAGNOSIS — Z79899 Other long term (current) drug therapy: Secondary | ICD-10-CM

## 2011-12-07 DIAGNOSIS — B20 Human immunodeficiency virus [HIV] disease: Secondary | ICD-10-CM

## 2011-12-07 LAB — COMPLETE METABOLIC PANEL WITH GFR
Alkaline Phosphatase: 86 U/L (ref 39–117)
BUN: 8 mg/dL (ref 6–23)
CO2: 26 mEq/L (ref 19–32)
Creat: 0.8 mg/dL (ref 0.50–1.10)
GFR, Est African American: >89 mL/min
GFR, Est Non African American: 88 mL/min
Glucose, Bld: 133 mg/dL — ABNORMAL HIGH (ref 70–99)
Sodium: 141 mEq/L (ref 135–145)
Total Bilirubin: 0.4 mg/dL (ref 0.3–1.2)
Total Protein: 6.7 g/dL (ref 6.0–8.3)

## 2011-12-07 LAB — LIPID PANEL
Cholesterol: 196 mg/dL (ref 0–200)
HDL: 45 mg/dL (ref >39)
Total CHOL/HDL Ratio: 4.4 Ratio
Triglycerides: 77 mg/dL (ref <150)

## 2011-12-07 LAB — CBC
Hemoglobin: 14.3 g/dL (ref 12.0–15.0)
MCH: 29 pg (ref 26.0–34.0)
MCHC: 34.7 g/dL (ref 30.0–36.0)
MCV: 83.6 fL (ref 78.0–100.0)

## 2011-12-09 LAB — HIV-1 RNA QUANT-NO REFLEX-BLD: HIV 1 RNA Quant: 99 copies/mL — ABNORMAL HIGH (ref <20)

## 2011-12-21 ENCOUNTER — Ambulatory Visit (INDEPENDENT_AMBULATORY_CARE_PROVIDER_SITE_OTHER): Payer: 59 | Admitting: Infectious Diseases

## 2011-12-21 ENCOUNTER — Encounter: Payer: Self-pay | Admitting: Infectious Diseases

## 2011-12-21 VITALS — BP 136/95 | HR 76 | Temp 98.6°F | Ht 62.0 in | Wt 203.0 lb

## 2011-12-21 DIAGNOSIS — B20 Human immunodeficiency virus [HIV] disease: Secondary | ICD-10-CM

## 2011-12-21 DIAGNOSIS — Z79899 Other long term (current) drug therapy: Secondary | ICD-10-CM

## 2011-12-21 DIAGNOSIS — I10 Essential (primary) hypertension: Secondary | ICD-10-CM

## 2011-12-21 DIAGNOSIS — Z113 Encounter for screening for infections with a predominantly sexual mode of transmission: Secondary | ICD-10-CM

## 2011-12-21 DIAGNOSIS — F17200 Nicotine dependence, unspecified, uncomplicated: Secondary | ICD-10-CM

## 2011-12-21 NOTE — Assessment & Plan Note (Signed)
She tried e-cigarette which broke and then restarted to smoke. Encouraged her to quit.Marland KitchenMarland Kitchen

## 2011-12-21 NOTE — Assessment & Plan Note (Signed)
She is doing well but has detectable virus (slight). States she has been under a lot of stress at home. Offered/refused condoms. Will get her onto PAP list with [facility]. rtc 6 months

## 2011-12-21 NOTE — Progress Notes (Signed)
  Subjective:    Patient ID: Shannon [name], female    DOB: 25-Nov-1965, 46 y.o.   MRN: [id number]  HPI 46 yo F HIV+ previous Chairi 1 repair (2000). Currently taking atripla (her only rx). Previously had thyroid bx- ? For cancerous cells, goiter.  States she is doing well, everything is great.  Has not had thyroid surgery.   HIV 1 RNA Quant (copies/mL)  Date Value  12/07/2011 99*  04/15/2011 <20   12/08/2010 <20      CD4 T Cell Abs (cmm)  Date Value  12/07/2011 900   04/15/2011 830   12/08/2010 830       Review of Systems  Constitutional: Negative for appetite change and unexpected weight change.  HENT: Negative for sore throat and neck pain.   Gastrointestinal: Negative for diarrhea and constipation.       Objective:   Physical Exam  Constitutional: She appears well-developed and well-nourished.  HENT:  Mouth/Throat: No oropharyngeal exudate.  Eyes: EOM are normal. Pupils are equal, round, and reactive to light.  Neck: Neck supple.  Cardiovascular: Normal rate, regular rhythm and normal heart sounds.   Pulmonary/Chest: Effort normal and breath sounds normal.  Abdominal: Soft. Bowel sounds are normal. She exhibits no distension. There is no tenderness.  Lymphadenopathy:    She has no cervical adenopathy.          Assessment & Plan:

## 2011-12-21 NOTE — Assessment & Plan Note (Signed)
More elevation of diastolic today. Will cont to watch.

## 2012-01-06 ENCOUNTER — Encounter: Payer: Self-pay | Admitting: Infectious Diseases

## 2012-01-08 ENCOUNTER — Ambulatory Visit: Payer: 59

## 2012-01-12 ENCOUNTER — Other Ambulatory Visit: Payer: Self-pay | Admitting: Licensed Clinical Social Worker

## 2012-01-12 DIAGNOSIS — B20 Human immunodeficiency virus [HIV] disease: Secondary | ICD-10-CM

## 2012-01-13 MED ORDER — EFAVIRENZ-EMTRICITAB-TENOFOVIR 600-200-300 MG PO TABS: 1.0000 | ORAL_TABLET | Freq: Every day | ORAL | Status: DC

## 2012-01-29 ENCOUNTER — Ambulatory Visit: Payer: 59

## 2012-02-26 ENCOUNTER — Ambulatory Visit (INDEPENDENT_AMBULATORY_CARE_PROVIDER_SITE_OTHER): Payer: 59 | Admitting: *Deleted

## 2012-02-26 DIAGNOSIS — Z124 Encounter for screening for malignant neoplasm of cervix: Secondary | ICD-10-CM

## 2012-02-26 NOTE — Progress Notes (Signed)
  Subjective:     Shannon [name] is a 46 y.o. woman who comes in today for a  pap smear only.  Previous abnormal Pap smears: yes. Contraception: hysterectomy due to abnl pap and polyps.  Objective:    There were no vitals taken for this visit. Pelvic Exam:  Pap smear obtained.   Assessment:    Screening pap smear.   Plan:     Follow up in one year, or as indicated by Pap results.  Pt given condoms. Pt given educational materials re: HIV and women, heart disease, nutrition, diet, exercise, PAP smears, self-esteem, and partner protection. BSE card given.

## 2012-02-26 NOTE — Patient Instructions (Signed)
Your results will be ready in about a week.  I will mail them to you.  Thank you for coming to the Center for your care.  Denise,  RN 

## 2012-03-02 ENCOUNTER — Encounter: Payer: Self-pay | Admitting: *Deleted

## 2012-03-30 ENCOUNTER — Telehealth: Payer: Self-pay | Admitting: *Deleted

## 2012-03-30 NOTE — Telephone Encounter (Signed)
Patient called to see if she could get a new activation code for mychart over the phone. Advised her we are not allowed to give them over the phone for the patients protection. Advised her we will be happy to give her another code if she stops by the office or she can get one at her next visit whatever is good for her schedule.

## 2012-07-29 ENCOUNTER — Telehealth: Payer: Self-pay | Admitting: *Deleted

## 2012-07-29 NOTE — Telephone Encounter (Signed)
Called patient to try and schedule a doctor visit and had to leave a message for the patient to call the office asap. Will also forward to [facility].

## 2012-08-09 ENCOUNTER — Other Ambulatory Visit (INDEPENDENT_AMBULATORY_CARE_PROVIDER_SITE_OTHER): Payer: 59

## 2012-08-09 ENCOUNTER — Other Ambulatory Visit: Payer: 59

## 2012-08-09 DIAGNOSIS — B20 Human immunodeficiency virus [HIV] disease: Secondary | ICD-10-CM

## 2012-08-09 DIAGNOSIS — Z79899 Other long term (current) drug therapy: Secondary | ICD-10-CM

## 2012-08-09 DIAGNOSIS — Z113 Encounter for screening for infections with a predominantly sexual mode of transmission: Secondary | ICD-10-CM

## 2012-08-09 LAB — LIPID PANEL
Cholesterol: 199 mg/dL (ref 0–200)
Total CHOL/HDL Ratio: 3.6 Ratio
VLDL: 14 mg/dL (ref 0–40)

## 2012-08-09 LAB — COMPREHENSIVE METABOLIC PANEL
ALT: 16 U/L (ref 0–35)
CO2: 27 mEq/L (ref 19–32)
Creat: 0.82 mg/dL (ref 0.50–1.10)
Total Bilirubin: 0.4 mg/dL (ref 0.3–1.2)

## 2012-08-09 LAB — CBC
HCT: 39.7 % (ref 36.0–46.0)
Hemoglobin: 13.8 g/dL (ref 12.0–15.0)
MCHC: 34.8 g/dL (ref 30.0–36.0)
Platelets: 243 10*3/uL (ref 150–400)
RBC: 4.74 MIL/uL (ref 3.87–5.11)
WBC: 6.5 10*3/uL (ref 4.0–10.5)

## 2012-08-10 LAB — T-HELPER CELL (CD4) - (RCID CLINIC ONLY): CD4 T Cell Abs: 1060 uL (ref 400–2700)

## 2012-08-11 LAB — HIV-1 RNA QUANT-NO REFLEX-BLD
HIV 1 RNA Quant: <20 copies/mL (ref <20)
HIV-1 RNA Quant, Log: <1.3 {Log} (ref <1.30)

## 2012-08-22 ENCOUNTER — Encounter: Payer: Self-pay | Admitting: Infectious Diseases

## 2012-08-22 ENCOUNTER — Ambulatory Visit (INDEPENDENT_AMBULATORY_CARE_PROVIDER_SITE_OTHER): Payer: 59 | Admitting: Infectious Diseases

## 2012-08-22 VITALS — BP 155/92 | HR 108 | Temp 98.2°F | Ht 62.0 in | Wt 195.0 lb

## 2012-08-22 DIAGNOSIS — Z113 Encounter for screening for infections with a predominantly sexual mode of transmission: Secondary | ICD-10-CM

## 2012-08-22 DIAGNOSIS — B20 Human immunodeficiency virus [HIV] disease: Secondary | ICD-10-CM

## 2012-08-22 DIAGNOSIS — E010 Iodine-deficiency related diffuse (endemic) goiter: Secondary | ICD-10-CM | POA: Insufficient documentation

## 2012-08-22 DIAGNOSIS — I10 Essential (primary) hypertension: Secondary | ICD-10-CM

## 2012-08-22 DIAGNOSIS — E041 Nontoxic single thyroid nodule: Secondary | ICD-10-CM

## 2012-08-22 NOTE — Assessment & Plan Note (Signed)
Pt with prev abn bx. Will re-send her to surgery.

## 2012-08-22 NOTE — Assessment & Plan Note (Signed)
She is doing very well. Offered/refused condoms. Declines flu. Will recheck her Hep B S Ab.  See her back in 6 months.

## 2012-08-22 NOTE — Progress Notes (Signed)
  Subjective:    Patient ID: Shannon [name], female    DOB: 11-Mar-1966, 47 y.o.   MRN: [id number]  HPI 47 yo F HIV+,  previous Chairi 1 repair (2000). Currently taking atripla (her only rx). Previously had thyroid bx- ? For cancerous cells, goiter. Has not had thyroid surgery, states she needs f/u appt.   Normal pap sept 2013.  No problem with medications. C/o going bald.   HIV 1 RNA Quant (copies/mL)  Date Value  08/09/2012 <20   12/07/2011 99*  04/15/2011 <20      CD4 T Cell Abs (cmm)  Date Value  08/09/2012 1060   12/07/2011 900   04/15/2011 830       Review of Systems  Constitutional: Negative for appetite change and unexpected weight change.  HENT: Positive for rhinorrhea.   Respiratory: Positive for cough. Negative for shortness of breath.   Cardiovascular: Negative for chest pain.  Gastrointestinal: Negative for diarrhea and constipation.  Genitourinary: Negative for difficulty urinating.  Neurological: Negative for headaches.       Objective:   Physical Exam  Constitutional: She appears well-developed and well-nourished.  HENT:  Mouth/Throat: No oropharyngeal exudate.  Eyes: EOM are normal. Pupils are equal, round, and reactive to light.  Neck: Neck supple.  Cardiovascular: Normal rate, regular rhythm and normal heart sounds.   Pulmonary/Chest: Effort normal and breath sounds normal.  Abdominal: Soft. Bowel sounds are normal. There is no tenderness. There is no rebound.  Lymphadenopathy:    She has no cervical adenopathy.          Assessment & Plan:

## 2012-08-22 NOTE — Assessment & Plan Note (Signed)
Prev #s have been good. Have asked her to recheck at pharmacies when she is out and about. She attributes this to rushing to get here this AM on icy roads.

## 2012-08-22 NOTE — Assessment & Plan Note (Signed)
>>  ASSESSMENT AND PLAN FOR THYROID  NODULE WRITTEN ON 08/22/2012  9:33 AM BY HATCHER, JEFFREY C, MD  Pt with prev abn bx. Will re-send her to surgery.

## 2012-08-26 ENCOUNTER — Other Ambulatory Visit: Payer: Self-pay | Admitting: Otolaryngology

## 2012-08-26 DIAGNOSIS — D497 Neoplasm of unspecified behavior of endocrine glands and other parts of nervous system: Secondary | ICD-10-CM

## 2012-08-30 ENCOUNTER — Other Ambulatory Visit: Payer: 59

## 2012-08-30 ENCOUNTER — Ambulatory Visit
Admission: RE | Admit: 2012-08-30 | Discharge: 2012-08-30 | Disposition: A | Discharge status: Home / Self Care | Payer: 59 | Source: Ambulatory Visit | Attending: Otolaryngology | Admitting: Otolaryngology

## 2012-08-30 DIAGNOSIS — D497 Neoplasm of unspecified behavior of endocrine glands and other parts of nervous system: Secondary | ICD-10-CM

## 2013-01-31 ENCOUNTER — Other Ambulatory Visit: Payer: Self-pay | Admitting: Infectious Diseases

## 2013-02-15 ENCOUNTER — Other Ambulatory Visit: Payer: 59

## 2013-03-01 ENCOUNTER — Ambulatory Visit: Payer: 59 | Admitting: Infectious Diseases

## 2013-03-30 ENCOUNTER — Other Ambulatory Visit (INDEPENDENT_AMBULATORY_CARE_PROVIDER_SITE_OTHER): Payer: 59

## 2013-03-30 DIAGNOSIS — B20 Human immunodeficiency virus [HIV] disease: Secondary | ICD-10-CM

## 2013-03-30 LAB — CBC
MCHC: 34.8 g/dL (ref 30.0–36.0)
Platelets: 253 10*3/uL (ref 150–400)
RDW: 14.7 % (ref 11.5–15.5)
WBC: 5.7 10*3/uL (ref 4.0–10.5)

## 2013-03-30 LAB — COMPREHENSIVE METABOLIC PANEL
ALT: 15 U/L (ref 0–35)
AST: 14 U/L (ref 0–37)
Alkaline Phosphatase: 84 U/L (ref 39–117)
Calcium: 9.1 mg/dL (ref 8.4–10.5)
Chloride: 109 mEq/L (ref 96–112)
Creat: 0.86 mg/dL (ref 0.50–1.10)
Potassium: 4.1 mEq/L (ref 3.5–5.3)

## 2013-03-31 LAB — T-HELPER CELL (CD4) - (RCID CLINIC ONLY): CD4 T Cell Abs: 800 /uL (ref 400–2700)

## 2013-04-03 LAB — HIV-1 RNA QUANT-NO REFLEX-BLD: HIV-1 RNA Quant, Log: <1.3 {Log} (ref <1.30)

## 2013-04-13 ENCOUNTER — Other Ambulatory Visit: Payer: Self-pay

## 2013-04-13 ENCOUNTER — Encounter: Payer: Self-pay | Admitting: Infectious Diseases

## 2013-04-13 ENCOUNTER — Ambulatory Visit (INDEPENDENT_AMBULATORY_CARE_PROVIDER_SITE_OTHER): Payer: 59 | Admitting: Infectious Diseases

## 2013-04-13 VITALS — BP 158/98 | HR 98 | Temp 98.8°F | Wt 196.0 lb

## 2013-04-13 DIAGNOSIS — R739 Hyperglycemia, unspecified: Secondary | ICD-10-CM

## 2013-04-13 DIAGNOSIS — F17200 Nicotine dependence, unspecified, uncomplicated: Secondary | ICD-10-CM

## 2013-04-13 DIAGNOSIS — Z79899 Other long term (current) drug therapy: Secondary | ICD-10-CM

## 2013-04-13 DIAGNOSIS — B20 Human immunodeficiency virus [HIV] disease: Secondary | ICD-10-CM

## 2013-04-13 DIAGNOSIS — E041 Nontoxic single thyroid nodule: Secondary | ICD-10-CM

## 2013-04-13 DIAGNOSIS — R7309 Other abnormal glucose: Secondary | ICD-10-CM

## 2013-04-13 DIAGNOSIS — I10 Essential (primary) hypertension: Secondary | ICD-10-CM

## 2013-04-13 DIAGNOSIS — Z113 Encounter for screening for infections with a predominantly sexual mode of transmission: Secondary | ICD-10-CM

## 2013-04-13 NOTE — Assessment & Plan Note (Signed)
Encouraged to quit. 

## 2013-04-13 NOTE — Assessment & Plan Note (Signed)
Nl at last lab. Will cont to watch.

## 2013-04-13 NOTE — Assessment & Plan Note (Signed)
>>  ASSESSMENT AND PLAN FOR THYROID  NODULE WRITTEN ON 04/13/2013  2:54 PM BY HATCHER, JEFFREY C, MD  apprecaite ENT f/u.

## 2013-04-13 NOTE — Progress Notes (Signed)
  Subjective:    Patient ID: Shannon [name], female    DOB: 11/04/1965, 47 y.o.   MRN: [id number]  HPI 47 yo F HIV+, previous Chairi 1 repair (2000). Currently taking atripla (her only rx). Previously had thyroid bx- ? For cancerous cells, goiter. Has not had thyroid surgery. She states she had ENT eval who suggested that she did not need surgery. She had u/s 08-2012 that showed a stable multinodular goiter.  Normal pap sept 2013.  No probs with atripla except occasionally wants to take with food.   HIV 1 RNA Quant (copies/mL)  Date Value  03/30/2013 <20   08/09/2012 <20   12/07/2011 99*     CD4 T Cell Abs (/uL)  Date Value  03/30/2013 800   08/09/2012 1060   12/07/2011 900     Review of Systems  Constitutional: Negative for fever and chills.  Gastrointestinal: Negative for diarrhea and constipation.  Genitourinary: Negative for difficulty urinating and menstrual problem.       Objective:   Physical Exam  Constitutional: She appears well-developed and well-nourished.  Eyes: EOM are normal. Pupils are equal, round, and reactive to light.  Neck: Neck supple.  Cardiovascular: Normal rate, regular rhythm and normal heart sounds.   Pulmonary/Chest: Effort normal and breath sounds normal.  Abdominal: Soft. Bowel sounds are normal. There is no tenderness. There is no rebound.  Lymphadenopathy:    She has no cervical adenopathy.          Assessment & Plan:

## 2013-04-13 NOTE — Assessment & Plan Note (Signed)
apprecaite ENT f/u.

## 2013-04-13 NOTE — Assessment & Plan Note (Signed)
She is doing very well. Gets flu shot today. Tet vax next visit. Will get colon at 47 yo. Offered/refuses condoms. Get her into PAP clinic. Will see back in 6 months. Told her ok to take atripla with food if needed. Encouraged to wear seat belt. Will check her Hep B SAb at next lab.

## 2013-04-13 NOTE — Assessment & Plan Note (Signed)
She is off meds. asx (no CP, no headache). She will check BP at work weekly, let us know if elevated (> 150/90) and we will start anti-htn rx.

## 2013-06-21 ENCOUNTER — Encounter: Payer: Self-pay | Admitting: *Deleted

## 2013-06-24 ENCOUNTER — Other Ambulatory Visit: Payer: Self-pay | Admitting: Infectious Diseases

## 2013-06-24 DIAGNOSIS — B20 Human immunodeficiency virus [HIV] disease: Secondary | ICD-10-CM

## 2013-07-21 ENCOUNTER — Ambulatory Visit: Payer: 59

## 2013-09-12 ENCOUNTER — Encounter: Payer: Self-pay | Admitting: *Deleted

## 2013-10-04 ENCOUNTER — Other Ambulatory Visit: Payer: 59

## 2013-10-05 ENCOUNTER — Other Ambulatory Visit (INDEPENDENT_AMBULATORY_CARE_PROVIDER_SITE_OTHER): Payer: 59

## 2013-10-05 DIAGNOSIS — B20 Human immunodeficiency virus [HIV] disease: Secondary | ICD-10-CM

## 2013-10-05 DIAGNOSIS — Z79899 Other long term (current) drug therapy: Secondary | ICD-10-CM

## 2013-10-05 DIAGNOSIS — Z113 Encounter for screening for infections with a predominantly sexual mode of transmission: Secondary | ICD-10-CM

## 2013-10-05 LAB — CBC WITH DIFFERENTIAL/PLATELET
Basophils Absolute: 0 10*3/uL (ref 0.0–0.1)
Basophils Relative: 0 % (ref 0–1)
EOS PCT: 1 % (ref 0–5)
Eosinophils Absolute: 0.1 10*3/uL (ref 0.0–0.7)
HCT: 42.3 % (ref 36.0–46.0)
Hemoglobin: 14.7 g/dL (ref 12.0–15.0)
LYMPHS PCT: 57 % — AB (ref 12–46)
Lymphs Abs: 3.2 10*3/uL (ref 0.7–4.0)
MCH: 29.5 pg (ref 26.0–34.0)
MCHC: 34.8 g/dL (ref 30.0–36.0)
MCV: 84.9 fL (ref 78.0–100.0)
Monocytes Absolute: 0.4 10*3/uL (ref 0.1–1.0)
Monocytes Relative: 8 % (ref 3–12)
NEUTROS ABS: 1.9 10*3/uL (ref 1.7–7.7)
Neutrophils Relative %: 34 % — ABNORMAL LOW (ref 43–77)
PLATELETS: 255 10*3/uL (ref 150–400)
RBC: 4.98 MIL/uL (ref 3.87–5.11)
RDW: 14.7 % (ref 11.5–15.5)
WBC: 5.6 10*3/uL (ref 4.0–10.5)

## 2013-10-05 LAB — COMPLETE METABOLIC PANEL WITH GFR
ALT: 14 U/L (ref 0–35)
AST: 15 U/L (ref 0–37)
Albumin: 4.2 g/dL (ref 3.5–5.2)
Alkaline Phosphatase: 92 U/L (ref 39–117)
BUN: 7 mg/dL (ref 6–23)
CALCIUM: 9.2 mg/dL (ref 8.4–10.5)
CHLORIDE: 108 meq/L (ref 96–112)
CO2: 28 mEq/L (ref 19–32)
CREATININE: 0.88 mg/dL (ref 0.50–1.10)
GFR, Est African American: 89 mL/min
GFR, Est Non African American: 77 mL/min
Glucose, Bld: 82 mg/dL (ref 70–99)
POTASSIUM: 4.6 meq/L (ref 3.5–5.3)
Sodium: 141 mEq/L (ref 135–145)
Total Bilirubin: 0.4 mg/dL (ref 0.2–1.2)
Total Protein: 6.9 g/dL (ref 6.0–8.3)

## 2013-10-05 LAB — LIPID PANEL
CHOL/HDL RATIO: 3.5 ratio
Cholesterol: 218 mg/dL — ABNORMAL HIGH (ref 0–200)
HDL: 62 mg/dL (ref >39)
LDL Cholesterol: 141 mg/dL — ABNORMAL HIGH (ref 0–99)
TRIGLYCERIDES: 77 mg/dL (ref <150)
VLDL: 15 mg/dL (ref 0–40)

## 2013-10-06 LAB — URINE CYTOLOGY ANCILLARY ONLY
Chlamydia: NEGATIVE
Neisseria Gonorrhea: NEGATIVE

## 2013-10-06 LAB — T-HELPER CELL (CD4) - (RCID CLINIC ONLY)
CD4 T CELL ABS: 980 /uL (ref 400–2700)
CD4 T CELL HELPER: 28 % — AB (ref 33–55)

## 2013-10-06 LAB — HIV-1 RNA QUANT-NO REFLEX-BLD
HIV 1 RNA QUANT: 21 {copies}/mL (ref <20)
HIV-1 RNA QUANT, LOG: 1.32 {Log} — AB (ref <1.30)

## 2013-10-06 LAB — HEPATITIS B SURFACE ANTIBODY,QUALITATIVE: HEP B S AB: NEGATIVE

## 2013-10-06 LAB — RPR

## 2013-10-18 ENCOUNTER — Ambulatory Visit: Payer: 59 | Admitting: Infectious Diseases

## 2013-10-18 ENCOUNTER — Ambulatory Visit (INDEPENDENT_AMBULATORY_CARE_PROVIDER_SITE_OTHER): Payer: Self-pay | Admitting: *Deleted

## 2013-10-18 VITALS — BP 136/93 | HR 76 | Temp 98.6°F | Resp 16 | Ht 62.0 in | Wt 192.0 lb

## 2013-10-18 DIAGNOSIS — Z21 Asymptomatic human immunodeficiency virus [HIV] infection status: Secondary | ICD-10-CM

## 2013-10-18 DIAGNOSIS — B20 Human immunodeficiency virus [HIV] disease: Secondary | ICD-10-CM

## 2013-10-18 NOTE — Progress Notes (Signed)
Shannon [name]  is here for A5332 screening visit - Randomized Trial to prevent Vascular Events in HIV (REPRIEVE study). We reviewed the informed consent together. I fully explained the consent, risks, benefits, responsibilities, and answered questions. Participant verbalized understanding and signed the consent witnessed by me. I gave a copy of the signed consent to participant. Blood did not need to be drawn at this visit since she had fasting labs drawn 10/05/13.   Neuro: A&Ox3. Hx of Chairi repair with residual of abnormal grip in left hand.  Thyroid hx of multiple nodules. Pt states she had a biopsy performed and she was told there were abnormal cells noted and was recommended she have it removed. She went for a second opinion and was told that her thyroid was fully functional and she is showing no signs of thyroid disease. Relayed this information to Dr. Tommy Medal and he agrees that she has no sign of thyroid disease.   Resp: Lungs clear in all bases. Equal breath sounds and rise of chest. CV: NSR  GI: Normal bowel sounds. Reports regular BM. GU: reports no issues MS: stiffness in knees bilat Skin: Intact.  We reviewed her medical history, medications, and signs and symptoms. She received $50 gift card for study visit. Entry visit scheduled for November 06, 2013 @ 9am. Araceli Bouche RN

## 2013-11-06 ENCOUNTER — Ambulatory Visit (INDEPENDENT_AMBULATORY_CARE_PROVIDER_SITE_OTHER): Payer: Self-pay | Admitting: *Deleted

## 2013-11-06 VITALS — BP 135/87 | HR 80 | Temp 98.7°F | Resp 16 | Wt 194.0 lb

## 2013-11-06 DIAGNOSIS — Z21 Asymptomatic human immunodeficiency virus [HIV] infection status: Secondary | ICD-10-CM

## 2013-11-06 DIAGNOSIS — B20 Human immunodeficiency virus [HIV] disease: Secondary | ICD-10-CM

## 2013-11-08 NOTE — Progress Notes (Signed)
Shannon [name] is here for entry into A5332/REPRIEVE study. Eligibility checklist was reviewed and she continues to want to enter onto study. S&S, medications, and dx were reviewed. Vital signs stable. Fasting labs drawn and questionnaires completed. ECG performed. Medications were dispensed and we discussed correct ways of taking medication and side effects to watch out for. She received $50 gift card for her visit.Next appointment scheduled for December 04, 2013 @ 9am. Araceli Bouche RN

## 2013-11-20 ENCOUNTER — Encounter: Payer: Self-pay | Admitting: Infectious Diseases

## 2013-11-20 ENCOUNTER — Ambulatory Visit (INDEPENDENT_AMBULATORY_CARE_PROVIDER_SITE_OTHER): Payer: 59 | Admitting: Infectious Diseases

## 2013-11-20 VITALS — BP 143/96 | HR 85 | Temp 98.6°F | Wt 193.0 lb

## 2013-11-20 DIAGNOSIS — F3289 Other specified depressive episodes: Secondary | ICD-10-CM

## 2013-11-20 DIAGNOSIS — E041 Nontoxic single thyroid nodule: Secondary | ICD-10-CM

## 2013-11-20 DIAGNOSIS — F329 Major depressive disorder, single episode, unspecified: Secondary | ICD-10-CM

## 2013-11-20 DIAGNOSIS — B20 Human immunodeficiency virus [HIV] disease: Secondary | ICD-10-CM

## 2013-11-20 MED ORDER — EMTRICITAB-RILPIVIR-TENOFOV DF 200-25-300 MG PO TABS: 1.0000 | ORAL_TABLET | Freq: Every day | ORAL | Status: DC

## 2013-11-20 NOTE — Assessment & Plan Note (Addendum)
She is doing very well. Not sexually active. Will change her atripla to complera if she can get co-pay assistance. Will see her back in 3-4 months.   She needs Hep B update but wants to defer.  Needs mammo, pap

## 2013-11-20 NOTE — Assessment & Plan Note (Signed)
Will have her see Grayland Ormond. She contracts for safety. Will try to her there back into [facility].

## 2013-11-20 NOTE — Progress Notes (Signed)
   Subjective:    Patient ID: Shannon [name], female    DOB: 22-Jul-1965, 48 y.o.   MRN: [id number]  HPI 48 yo F HIV+, previous Chairi 1 repair (2000). Currently taking atripla (her only rx). Previously had thyroid bx- ? For cancerous cells, goiter. Has not had thyroid surgery. She states she had ENT eval who suggested that she did not need surgery. She had u/s 08-2012 that showed a stable multinodular goiter.  Normal pap 2013.  Today has been more depressed, has been going on for years. Has had social withdrawal, doesn't want to go to work. Decreased appetite. Wt down 13# since 04-2011. Now taking atripla in AM. Does not have complete rest. Enjoys reading.  Support is daughter. Was previously seeing Tyrone at [facility].   HIV 1 RNA Quant (copies/mL)  Date Value  10/05/2013 21   03/30/2013 <20   08/09/2012 <20      CD4 T Cell Abs (/uL)  Date Value  10/05/2013 980   03/30/2013 800   08/09/2012 1060     Review of Systems  Constitutional: Negative for appetite change and unexpected weight change.  Gastrointestinal: Negative for diarrhea and constipation.  Genitourinary: Negative for difficulty urinating.  Psychiatric/Behavioral: Positive for suicidal ideas, sleep disturbance and dysphoric mood.       Objective:   Physical Exam  Constitutional: She appears well-developed and well-nourished.  HENT:  Mouth/Throat: No oropharyngeal exudate.  Eyes: EOM are normal. Pupils are equal, round, and reactive to light.  Neck: Neck supple. No thyromegaly present.  Cardiovascular: Normal rate, regular rhythm and normal heart sounds.   Pulmonary/Chest: Effort normal and breath sounds normal.  Abdominal: Soft. Bowel sounds are normal. There is no tenderness.  Lymphadenopathy:    She has no cervical adenopathy.  Psychiatric: She is not agitated, not aggressive, not hyperactive, not slowed, not withdrawn, not actively hallucinating and not combative. She exhibits a depressed mood.  She contracts for  safety. She will call if she has more or more intense feelings of SI She is attentive.          Assessment & Plan:

## 2013-11-20 NOTE — Assessment & Plan Note (Signed)
Will repeat her u/s 

## 2013-11-20 NOTE — Addendum Note (Signed)
Addended by: Jarrett Ables D on: 11/20/2013 04:21 PM   Modules accepted: Orders

## 2013-11-20 NOTE — Assessment & Plan Note (Signed)
>>  ASSESSMENT AND PLAN FOR THYROID  NODULE WRITTEN ON 11/20/2013  3:52 PM BY HATCHER, JEFFREY C, MD  Will repeat her u/s.

## 2013-11-22 ENCOUNTER — Encounter: Payer: Self-pay | Admitting: Infectious Diseases

## 2013-11-22 NOTE — Telephone Encounter (Signed)
Please see the patient's message below. Landis Gandy, RN ___________________________  I cancelled the ultra sound scheduled for Friday because they wanted $265 that I do not have. I have to meet my deductible before my insurance will pay. I also cannot afford payments at this time.

## 2013-11-24 ENCOUNTER — Ambulatory Visit (HOSPITAL_COMMUNITY): Payer: 59

## 2013-12-01 ENCOUNTER — Encounter: Payer: Self-pay | Admitting: *Deleted

## 2013-12-01 ENCOUNTER — Ambulatory Visit (INDEPENDENT_AMBULATORY_CARE_PROVIDER_SITE_OTHER): Payer: Self-pay | Admitting: *Deleted

## 2013-12-01 ENCOUNTER — Ambulatory Visit (INDEPENDENT_AMBULATORY_CARE_PROVIDER_SITE_OTHER): Payer: 59 | Admitting: *Deleted

## 2013-12-01 VITALS — BP 142/89 | HR 78 | Temp 98.7°F | Resp 16 | Wt 192.2 lb

## 2013-12-01 DIAGNOSIS — B20 Human immunodeficiency virus [HIV] disease: Secondary | ICD-10-CM

## 2013-12-01 DIAGNOSIS — Z113 Encounter for screening for infections with a predominantly sexual mode of transmission: Secondary | ICD-10-CM

## 2013-12-01 DIAGNOSIS — Z124 Encounter for screening for malignant neoplasm of cervix: Secondary | ICD-10-CM

## 2013-12-01 LAB — COMPREHENSIVE METABOLIC PANEL
ALT: 13 U/L (ref 0–35)
AST: 15 U/L (ref 0–37)
Albumin: 4.3 g/dL (ref 3.5–5.2)
Alkaline Phosphatase: 80 U/L (ref 39–117)
BUN: 8 mg/dL (ref 6–23)
CALCIUM: 9.1 mg/dL (ref 8.4–10.5)
CHLORIDE: 112 meq/L (ref 96–112)
CO2: 24 meq/L (ref 19–32)
Creat: 0.92 mg/dL (ref 0.50–1.10)
Glucose, Bld: 86 mg/dL (ref 70–99)
POTASSIUM: 4.4 meq/L (ref 3.5–5.3)
SODIUM: 144 meq/L (ref 135–145)
TOTAL PROTEIN: 6.9 g/dL (ref 6.0–8.3)
Total Bilirubin: 0.3 mg/dL (ref 0.2–1.2)

## 2013-12-01 NOTE — Progress Notes (Signed)
  Subjective:     Shannon [name] is a 48 y.o. woman who comes in today for a  pap smear only.  Previous abnormal Pap smears: no. Contraception: menopausal.  Pt c/o discomfort when introducing vaginal speculum.  Unable to observe cervix due to discomfort.  Obtained vaginal sample.  Objective:    There were no vitals taken for this visit. Pelvic Exam: Vaginal Pap smear obtained.   Assessment:    Screening pap smear.   Plan:    Follow up in one year, or as indicated by Pap results.  Pt given educational materials re: HIV and women, self-esteem, BSE, nutrition and diet management, PAP smears and partner safety. Pt given condoms.

## 2013-12-01 NOTE — Progress Notes (Signed)
Patient ID: Shannon [name], female   DOB: 1966-05-08, 47 y.o.   MRN: [id number] Pt wanted Dr. Johnnye Sima to know that the previous "disturbing thoughts" have ended since switching from Atripla to [place].  She is very pleased with Complera.  No side effects what so ever.

## 2013-12-01 NOTE — Patient Instructions (Signed)
Your results will be ready in about a week.  I will mail them to you.  Thank you for coming to the Center for your care.  Denise,  RN 

## 2013-12-01 NOTE — Progress Notes (Signed)
Shannon [name] was here for a PAP visit and asked about changing her appt with research scheduled for the 29th of June. I told her we could go ahead and see her today if it was more convenient for her. She did not have her pills with her for the pill count and I did ask her to count them at home today and let me know the number she had left. She says she has not noticed any side effects from the pitavastatin or had any new complaints. She did switch her ARVs to Complera mid June. She will return in September at the same time she has an appt. with Dr. Johnnye Sima.

## 2013-12-04 LAB — CYTOLOGY - PAP

## 2013-12-05 ENCOUNTER — Encounter: Payer: Self-pay | Admitting: *Deleted

## 2014-01-09 ENCOUNTER — Ambulatory Visit: Payer: 59

## 2014-02-21 ENCOUNTER — Ambulatory Visit (INDEPENDENT_AMBULATORY_CARE_PROVIDER_SITE_OTHER): Payer: 59 | Admitting: Infectious Diseases

## 2014-02-21 ENCOUNTER — Other Ambulatory Visit: Payer: Self-pay | Admitting: Infectious Diseases

## 2014-02-21 ENCOUNTER — Encounter: Payer: Self-pay | Admitting: *Deleted

## 2014-02-21 ENCOUNTER — Encounter: Payer: Self-pay | Admitting: Infectious Diseases

## 2014-02-21 ENCOUNTER — Ambulatory Visit (INDEPENDENT_AMBULATORY_CARE_PROVIDER_SITE_OTHER): Payer: 59 | Admitting: *Deleted

## 2014-02-21 ENCOUNTER — Ambulatory Visit (INDEPENDENT_AMBULATORY_CARE_PROVIDER_SITE_OTHER): Payer: Self-pay | Admitting: *Deleted

## 2014-02-21 VITALS — BP 131/88 | HR 76 | Temp 98.6°F | Resp 16 | Wt 195.5 lb

## 2014-02-21 VITALS — BP 131/88 | HR 76 | Temp 98.6°F | Ht 62.0 in | Wt 195.0 lb

## 2014-02-21 DIAGNOSIS — B20 Human immunodeficiency virus [HIV] disease: Secondary | ICD-10-CM

## 2014-02-21 DIAGNOSIS — Z006 Encounter for examination for normal comparison and control in clinical research program: Secondary | ICD-10-CM

## 2014-02-21 DIAGNOSIS — Z23 Encounter for immunization: Secondary | ICD-10-CM

## 2014-02-21 DIAGNOSIS — F329 Major depressive disorder, single episode, unspecified: Secondary | ICD-10-CM

## 2014-02-21 DIAGNOSIS — F3289 Other specified depressive episodes: Secondary | ICD-10-CM

## 2014-02-21 LAB — CBC
HEMATOCRIT: 42.4 % (ref 36.0–46.0)
Hemoglobin: 14.7 g/dL (ref 12.0–15.0)
MCH: 29.5 pg (ref 26.0–34.0)
MCHC: 34.7 g/dL (ref 30.0–36.0)
MCV: 85.1 fL (ref 78.0–100.0)
PLATELETS: 232 10*3/uL (ref 150–400)
RBC: 4.98 MIL/uL (ref 3.87–5.11)
RDW: 14.7 % (ref 11.5–15.5)
WBC: 6 10*3/uL (ref 4.0–10.5)

## 2014-02-21 MED ORDER — SERTRALINE HCL 25 MG PO TABS: 25.0000 mg | ORAL_TABLET | Freq: Every day | ORAL | Status: DC

## 2014-02-21 MED ORDER — SERTRALINE HCL 25 MG PO TABS
25.0000 mg | ORAL_TABLET | Freq: Every day | ORAL | Status: DC
Start: 2014-02-21 — End: 2014-09-10

## 2014-02-21 NOTE — Progress Notes (Signed)
   Subjective:    Patient ID: Shannon [name], female    DOB: 07-26-1965, 48 y.o.   MRN: [id number]  HPI 48 yo F HIV+, previous Chairi 1 repair (2000). Currently taking atripla (her only rx). Previously had thyroid bx (2012?)- ? For cancerous cells, goiter. Has not had thyroid surgery. She states she had ENT eval who suggested that she did not need surgery. She had u/s 08-2012 that showed a stable multinodular goiter.  Normal pap June 2015.   No problems with atripla. Feels like her depression is better but still not social. Still crying. Unhappy at work, dealing with daughter with bipolar. Is curious about starting anti-depressant.   HIV 1 RNA Quant (copies/mL)  Date Value  10/05/2013 21   03/30/2013 <20   08/09/2012 <20      CD4 T Cell Abs (/uL)  Date Value  10/05/2013 980   03/30/2013 800   08/09/2012 1060    Review of Systems  Constitutional: Negative for appetite change and unexpected weight change.  Gastrointestinal: Negative for diarrhea and constipation.  Genitourinary: Negative for difficulty urinating.  Psychiatric/Behavioral: Negative for suicidal ideas, sleep disturbance and self-injury.      Objective:   Physical Exam  Constitutional: She appears well-developed and well-nourished.  HENT:  Mouth/Throat: No oropharyngeal exudate.  Eyes: EOM are normal. Pupils are equal, round, and reactive to light.  Neck: Neck supple.  Cardiovascular: Normal rate, regular rhythm and normal heart sounds.   Pulmonary/Chest: Effort normal and breath sounds normal.  Abdominal: Soft. Bowel sounds are normal. She exhibits no distension. There is no tenderness.  Lymphadenopathy:    She has no cervical adenopathy.  Psychiatric: She expresses no suicidal ideation. She expresses no suicidal plans.  Depressed.           Assessment & Plan:

## 2014-02-21 NOTE — Assessment & Plan Note (Signed)
Has negative Hep B S Ab. Will restart. Will send her to lab today. Gets flu today. Offered/refuses condoms. Will see her back in 6 weeks.

## 2014-02-21 NOTE — Patient Instructions (Signed)
Shannon [name] here for administration of flu vaccine. Vaccin was administered. Eliezer Champagne RN

## 2014-02-21 NOTE — Progress Notes (Signed)
Shannon [name] is here for A5332, month 4. She has no new complaints. She states she missed a few doses of study medication. She denies any muscle weakness and/or aches. She states that changing her ARV from Atripla to Complera and seeing Curley Spice has helped her to deal with hx of depression. 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 Pill count was performed with 15 remaining out of 90. Study medications were dispensed and vital signs are stable. She received $50 gift card and next appointment is scheduled for Wed, July 04, 2014 @ 9:30am. Eliezer Champagne RN

## 2014-02-21 NOTE — Assessment & Plan Note (Addendum)
Will giver her trial of zoloft, see her back in 6 weeks for effect. Greatly appreciate Kenny's f/u.

## 2014-02-21 NOTE — Addendum Note (Signed)
Addended by: Lajoyce Tamura C on: 02/21/2014 12:04 PM   Modules accepted: Orders

## 2014-02-22 LAB — COMPREHENSIVE METABOLIC PANEL
ALBUMIN: 4.2 g/dL (ref 3.5–5.2)
ALK PHOS: 84 U/L (ref 39–117)
ALT: 24 U/L (ref 0–35)
AST: 19 U/L (ref 0–37)
BUN: 10 mg/dL (ref 6–23)
CHLORIDE: 106 meq/L (ref 96–112)
CO2: 25 mEq/L (ref 19–32)
Calcium: 9.4 mg/dL (ref 8.4–10.5)
Creat: 0.91 mg/dL (ref 0.50–1.10)
Glucose, Bld: 82 mg/dL (ref 70–99)
POTASSIUM: 3.9 meq/L (ref 3.5–5.3)
SODIUM: 141 meq/L (ref 135–145)
Total Bilirubin: 0.6 mg/dL (ref 0.2–1.2)
Total Protein: 7.1 g/dL (ref 6.0–8.3)

## 2014-02-22 LAB — HIV-1 RNA QUANT-NO REFLEX-BLD: HIV-1 RNA Quant, Log: <1.3 {Log} (ref <1.30)

## 2014-02-22 LAB — T-HELPER CELL (CD4) - (RCID CLINIC ONLY)
CD4 % Helper T Cell: 30 % — ABNORMAL LOW (ref 33–55)
CD4 T CELL ABS: 1050 /uL (ref 400–2700)

## 2014-02-22 NOTE — Addendum Note (Signed)
Addended by: Landis Gandy on: 02/22/2014 04:38 PM   Modules accepted: Orders

## 2014-02-27 ENCOUNTER — Ambulatory Visit: Payer: 59

## 2014-03-13 ENCOUNTER — Ambulatory Visit: Payer: 59

## 2014-04-04 ENCOUNTER — Ambulatory Visit (INDEPENDENT_AMBULATORY_CARE_PROVIDER_SITE_OTHER): Payer: 59 | Admitting: Infectious Diseases

## 2014-04-04 ENCOUNTER — Encounter: Payer: Self-pay | Admitting: Infectious Diseases

## 2014-04-04 VITALS — BP 145/95 | HR 83 | Temp 98.3°F | Wt 192.0 lb

## 2014-04-04 DIAGNOSIS — B20 Human immunodeficiency virus [HIV] disease: Secondary | ICD-10-CM

## 2014-04-04 DIAGNOSIS — F32A Depression, unspecified: Secondary | ICD-10-CM

## 2014-04-04 DIAGNOSIS — R0789 Other chest pain: Secondary | ICD-10-CM

## 2014-04-04 DIAGNOSIS — Z23 Encounter for immunization: Secondary | ICD-10-CM

## 2014-04-04 DIAGNOSIS — F329 Major depressive disorder, single episode, unspecified: Secondary | ICD-10-CM

## 2014-04-04 DIAGNOSIS — E041 Nontoxic single thyroid nodule: Secondary | ICD-10-CM

## 2014-04-04 NOTE — Assessment & Plan Note (Addendum)
Doing well on her current art. Will f/u as previously scheduled. Next hep B today (#2). Has had flu shot.

## 2014-04-04 NOTE — Assessment & Plan Note (Signed)
Will encourage her to f/u with ENT next year when she has insurance changed.

## 2014-04-04 NOTE — Progress Notes (Signed)
   Subjective:    Patient ID: Shannon [name], female    DOB: 09-09-1965, 48 y.o.   MRN: [id number]  HPI 48 yo F HIV+, previous Chairi 1 repair (2000). Currently taking atripla (her only rx). Previously had thyroid bx (2012?)- ? For cancerous cells, goiter. Has not had thyroid surgery. She states she had ENT eval who suggested that she did not need surgery. She had u/s 08-2012 that showed a stable multinodular goiter.  Normal pap June 2015.  At her previous visit she was started on zoloft. Has been taking , feels like her mood is better. "Maybe it needs to be stronger". Sleeping better, less feelings of punching someone in the nose.  Her daughter is dx with bipolar. Has been seeing multiple counselors.   Has had heart burn, indigestion. Has an "air bubble". Had 2 days, consecutively. Lasted ~ 1 hour. Relieved on first day with belching. No radiation into arm. States feeling of tightness in her L neck is unchanged, L shoulder pain is unchanged. No prev gxt.   HIV 1 RNA Quant (copies/mL)  Date Value  02/21/2014 <20   10/05/2013 21   03/30/2013 <20      CD4 T Cell Abs (/uL)  Date Value  02/21/2014 1050   10/05/2013 980   03/30/2013 800    Does not feel like she can afford to go back to ENT. She will get new insurance next year.   Review of Systems  Constitutional: Negative for unexpected weight change.  Cardiovascular: Positive for chest pain.  Gastrointestinal: Positive for abdominal pain.       Objective:   Physical Exam  Constitutional: She appears well-developed and well-nourished.  Eyes: EOM are normal. Pupils are equal, round, and reactive to light.  Neck: Neck supple.  Cardiovascular: Normal rate, regular rhythm and normal heart sounds.   Pulmonary/Chest: Effort normal and breath sounds normal.  Abdominal: Soft. Bowel sounds are normal. There is no tenderness.  Lymphadenopathy:    She has no cervical adenopathy.  Psychiatric: She has a normal mood and affect. Her  behavior is normal. Thought content normal.          Assessment & Plan:

## 2014-04-04 NOTE — Assessment & Plan Note (Signed)
Seems to be better on zoloft. hesitant to increase due to her having dry mouth.

## 2014-04-04 NOTE — Assessment & Plan Note (Signed)
>>  ASSESSMENT AND PLAN FOR THYROID  NODULE WRITTEN ON 04/04/2014 11:58 AM BY HATCHER, JEFFREY C, MD  Will encourage her to f/u with ENT next year when she has insurance changed.

## 2014-04-06 ENCOUNTER — Ambulatory Visit (INDEPENDENT_AMBULATORY_CARE_PROVIDER_SITE_OTHER): Payer: 59 | Admitting: Internal Medicine

## 2014-04-06 ENCOUNTER — Encounter: Payer: Self-pay | Admitting: Internal Medicine

## 2014-04-06 VITALS — BP 146/109 | HR 80 | Ht 62.0 in | Wt 193.3 lb

## 2014-04-06 DIAGNOSIS — F17200 Nicotine dependence, unspecified, uncomplicated: Secondary | ICD-10-CM

## 2014-04-06 DIAGNOSIS — Z720 Tobacco use: Secondary | ICD-10-CM

## 2014-04-06 DIAGNOSIS — R0609 Other forms of dyspnea: Secondary | ICD-10-CM

## 2014-04-06 DIAGNOSIS — Z8249 Family history of ischemic heart disease and other diseases of the circulatory system: Secondary | ICD-10-CM

## 2014-04-06 DIAGNOSIS — R0789 Other chest pain: Secondary | ICD-10-CM

## 2014-04-06 DIAGNOSIS — R079 Chest pain, unspecified: Secondary | ICD-10-CM

## 2014-04-06 DIAGNOSIS — R0600 Dyspnea, unspecified: Secondary | ICD-10-CM

## 2014-04-06 DIAGNOSIS — B20 Human immunodeficiency virus [HIV] disease: Secondary | ICD-10-CM

## 2014-04-06 DIAGNOSIS — R0602 Shortness of breath: Secondary | ICD-10-CM

## 2014-04-06 DIAGNOSIS — I10 Essential (primary) hypertension: Secondary | ICD-10-CM

## 2014-04-06 NOTE — Progress Notes (Signed)
OFFICE NOTE  Chief Complaint:  Chest pain, DOE  Primary Care Physician: Bobby Rumpf, MD  HPI:  Shannon [name] is a pleasant 48 year old female with a past medical history significant for HIV disease for the past 9 years, who is on triple therapy with an undetectable viral load. She also has a diagnosis of hypertension but is not on medications, a family history of heart disease, tobacco abuse, and prior history of left leg DVT (took warfarin for 6 months) as well as 2 episodes of what sounds like superficial thrombophlebitis in the left leg subsequent to that. She was previously on aspirin but no longer takes that. Recently she's been having some discomfort in the center of her chest. It is somewhat like a pressure, rated at a 6-7 out of 10. Comes and goes without any clear pattern. Sometimes worse with exertion and relieved by rest, other times after eating. The symptoms sometimes radiate up to her left neck. She does get some shortness of breath when walking. She occasionally gets pain in her calves when walking mostly uphills or going up stairs but does not have problem walking on a flat surface.  PMHx:  Past Medical History  Diagnosis Date  . HIV disease   . Multiple thyroid nodules     Fully functioning thyroid with no treatment indicated    Past Surgical History  Procedure Laterality Date  . Abdominal hysterectomy    . Cholecystectomy      FAMHx:  Family History  Problem Relation Age of Onset  . Asthma Mother   . Hypertension Mother   . Diabetes Mother   . Diabetes Father   . Cancer Father     colon at 65 yo    SOCHx:   reports that she has been smoking Cigarettes.  She has a 12.5 pack-year smoking history. She has never used smokeless tobacco. She reports that she does not drink alcohol or use illicit drugs.  ALLERGIES:  Allergies  Allergen Reactions  . Shellfish Allergy Anaphylaxis    Shrimp     ROS: A comprehensive review of systems was negative  except for: Respiratory: positive for dyspnea on exertion Cardiovascular: positive for chest pain  HOME MEDS: Current Outpatient Prescriptions  Medication Sig Dispense Refill  . Emtricitab-Rilpivir-Tenofovir 200-25-300 MG TABS Take 1 tablet by mouth daily.      . sertraline (ZOLOFT) 25 MG tablet Take 1 tablet (25 mg total) by mouth daily.  90 tablet  1   No current facility-administered medications for this visit.    LABS/IMAGING: No results found for this or any previous visit (from the past 48 hour(s)). No results found.  VITALS: BP 146/109  Pulse 80  Ht 5\' 2"  (1.575 m)  Wt 193 lb 4.8 oz (87.68 kg)  BMI 35.35 kg/m2  EXAM: General appearance: alert and no distress Neck: no carotid bruit and no JVD Lungs: clear to auscultation bilaterally Heart: regular rate and rhythm, S1, S2 normal, no murmur, click, rub or gallop Abdomen: soft, non-tender; bowel sounds normal; no masses,  no organomegaly Extremities: extremities normal, atraumatic, no cyanosis or edema Pulses: 2+ and symmetric Skin: Skin color, texture, turgor normal. No rashes or lesions Neurologic: Grossly normal Psych: Normal  EKG: Normal sinus rhythm at 80  ASSESSMENT: 1. Chest pain/dyspnea on exertion 2. HIV disease 3. Possible hypertension-untreated 4. Family history of coronary disease 5. Ongoing tobacco abuse  PLAN: 1.   Shannon [name] has a number of cardiac risk factors including systemic viremia which  is a diabetic risk equivalent. She also has what appears to be untreated hypertension. Blood pressure initially was 146/109, however came down to 140/90. She may ultimately need to be on some blood pressure medication. She also has a history of DVT with out any clear etiology. She does not know if she ever had a workup for clotting disorders. She was previously on aspirin but has not been on any antiplatelet medications recently. Her chest discomfort has some typical and atypical features. It is difficult to  pin to any type of activity. Based on her increased risk, however I would recommend an exercise nuclear stress test which is more comprehensive given at least an intermediate pretest probability for coronary artery disease. I will plan to see her back to discuss results of her stress test and will recheck her blood pressure at that time. I may elect to treat her blood pressure if it remains elevated. Ultimately based on her DVT and superficial thrombophlebitis history as well as coronary risk equivalent, being on low-dose aspirin may make sense.  Thanks for the kind referral.  Pixie Casino, MD, [facility] Attending Cardiologist [facility]  Maxden Naji C 04/06/2014, 9:46 AM

## 2014-04-06 NOTE — Patient Instructions (Signed)
Your physician has requested that you have en exercise stress myoview. For further information please visit [email or web address]. Please follow instruction sheet, as given.  Your physician wants you to follow-up after your stress test.

## 2014-04-09 ENCOUNTER — Ambulatory Visit: Payer: 59

## 2014-04-13 ENCOUNTER — Telehealth (HOSPITAL_COMMUNITY): Payer: Self-pay

## 2014-04-13 NOTE — Telephone Encounter (Signed)
Encounter complete. 

## 2014-04-18 ENCOUNTER — Encounter (HOSPITAL_COMMUNITY): Payer: 59

## 2014-04-30 ENCOUNTER — Ambulatory Visit: Payer: 59 | Admitting: Internal Medicine

## 2014-05-08 ENCOUNTER — Ambulatory Visit: Payer: 59

## 2014-05-22 ENCOUNTER — Ambulatory Visit: Payer: 59

## 2014-06-05 ENCOUNTER — Ambulatory Visit: Payer: 59

## 2014-06-12 ENCOUNTER — Ambulatory Visit: Payer: 59

## 2014-06-27 ENCOUNTER — Ambulatory Visit: Payer: 59

## 2014-07-09 ENCOUNTER — Ambulatory Visit: Payer: 59

## 2014-07-11 ENCOUNTER — Ambulatory Visit: Payer: 59

## 2014-07-11 ENCOUNTER — Ambulatory Visit (INDEPENDENT_AMBULATORY_CARE_PROVIDER_SITE_OTHER): Payer: Self-pay | Admitting: *Deleted

## 2014-07-11 VITALS — BP 121/86 | HR 72 | Temp 98.7°F | Resp 16 | Wt 200.0 lb

## 2014-07-11 DIAGNOSIS — F4310 Post-traumatic stress disorder, unspecified: Secondary | ICD-10-CM

## 2014-07-11 DIAGNOSIS — Z006 Encounter for examination for normal comparison and control in clinical research program: Secondary | ICD-10-CM

## 2014-07-11 NOTE — Progress Notes (Signed)
Shannon [name] is here for her 8 month Reprieve study visit. She says that she was sick for several weeks in January with flulike sx and she did not take her [place] during that time. She forgot to bring her pill bottle back for a pill count. Her daughter was recently admitted to [facility] and that is stressing her out right now. She is to see Grayland Ormond shortly for her regular scheduled visit. She is to return in June for the next Reprieve visit.

## 2014-07-11 NOTE — BH Specialist Note (Signed)
Shannon [name] came in today reporting that her daughter was committed to [facility] inpatient last week due to suicidal ideation and not taking her medication for several days.  She was very upset about this and said "my world has turned upside down!"  She talked about this for a while and was able to settle down and even smile after a few minutes.  She said she was considering taking a day tomorrow just for herself and I encouraged her to do this.  We postponed continuing with any EMDR due to the intensity of it and she agreed with this.  Plan to meet in 2 weeks. Curley Spice, LCSW  [place]: [phone]

## 2014-07-24 ENCOUNTER — Ambulatory Visit: Payer: 59

## 2014-07-24 DIAGNOSIS — F4310 Post-traumatic stress disorder, unspecified: Secondary | ICD-10-CM

## 2014-07-24 NOTE — BH Specialist Note (Signed)
Shannon [name] reported that she fell on her steps yesterday and is a bit sore today from it.  She was able to laugh about it.  She also asked about finding a therapist for her daughter.  After clarifying her insurance ([place]), I recommended she call them and get some names, asking for therapists with training in trauma work.  She also said her daughter had a stomach virus over the weekend and that she thinks she may be getting it as well.  As we continued to talk, she began holding her stomach and eventually said "I'm not feeling well... Can we end it early today?"  Plan to meet again in 2 weeks. Curley Spice, LCSW  Whodas: 15

## 2014-08-07 ENCOUNTER — Ambulatory Visit: Payer: 59

## 2014-08-07 DIAGNOSIS — F4310 Post-traumatic stress disorder, unspecified: Secondary | ICD-10-CM

## 2014-08-07 NOTE — BH Specialist Note (Signed)
Shannon [name] was in a pleasant mood today, reporting that she took the day off to take her daughter to her appointment and to relax some.  She mentioned that she knows we "should be doing EMDR", but I assured her that there is no pressure to return to this at any time.  She complained that she has two bad habits:  Biting the inside of her cheek and picking at one of her fingers to the point that it causes a sore.  I discussed with her some options - chewing gum, chewing on toothpicks, finding something to turn over in her hand such as a "worry rock".  Then I suggested hypnosis and explained that I have training in this.  After a brief explanation and "demythologizing" of hypnotic trance, she agreed to give this a try.  We did a brief hypnosis session using positive suggestions of having smooth fingers and a healed cheek.  She responded very well to this and reported afterward that she was extremely relaxed during the session.  Plan to meet in 2 weeks. Curley Spice, LCSW  [place]: [phone]

## 2014-08-22 ENCOUNTER — Other Ambulatory Visit: Payer: Self-pay | Admitting: Infectious Diseases

## 2014-08-22 ENCOUNTER — Ambulatory Visit: Payer: 59

## 2014-08-22 ENCOUNTER — Other Ambulatory Visit (INDEPENDENT_AMBULATORY_CARE_PROVIDER_SITE_OTHER): Payer: 59

## 2014-08-22 DIAGNOSIS — F4310 Post-traumatic stress disorder, unspecified: Secondary | ICD-10-CM

## 2014-08-22 DIAGNOSIS — B20 Human immunodeficiency virus [HIV] disease: Secondary | ICD-10-CM

## 2014-08-22 LAB — CBC WITH DIFFERENTIAL/PLATELET
BASOS ABS: 0.1 10*3/uL (ref 0.0–0.1)
BASOS PCT: 1 % (ref 0–1)
Eosinophils Absolute: 0.1 10*3/uL (ref 0.0–0.7)
Eosinophils Relative: 1 % (ref 0–5)
HCT: 42.7 % (ref 36.0–46.0)
Hemoglobin: 14.6 g/dL (ref 12.0–15.0)
Lymphocytes Relative: 49 % — ABNORMAL HIGH (ref 12–46)
Lymphs Abs: 3.2 10*3/uL (ref 0.7–4.0)
MCH: 29.2 pg (ref 26.0–34.0)
MCHC: 34.2 g/dL (ref 30.0–36.0)
MCV: 85.4 fL (ref 78.0–100.0)
MONOS PCT: 10 % (ref 3–12)
MPV: 9.1 fL (ref 8.6–12.4)
Monocytes Absolute: 0.7 10*3/uL (ref 0.1–1.0)
Neutro Abs: 2.6 10*3/uL (ref 1.7–7.7)
Neutrophils Relative %: 39 % — ABNORMAL LOW (ref 43–77)
PLATELETS: 206 10*3/uL (ref 150–400)
RBC: 5 MIL/uL (ref 3.87–5.11)
RDW: 15.2 % (ref 11.5–15.5)
WBC: 6.6 10*3/uL (ref 4.0–10.5)

## 2014-08-22 LAB — COMPREHENSIVE METABOLIC PANEL
ALBUMIN: 4.1 g/dL (ref 3.5–5.2)
ALT: 28 U/L (ref 0–35)
AST: 25 U/L (ref 0–37)
Alkaline Phosphatase: 82 U/L (ref 39–117)
BUN: 10 mg/dL (ref 6–23)
CALCIUM: 9.1 mg/dL (ref 8.4–10.5)
CHLORIDE: 107 meq/L (ref 96–112)
CO2: 24 mEq/L (ref 19–32)
Creat: 0.94 mg/dL (ref 0.50–1.10)
GLUCOSE: 84 mg/dL (ref 70–99)
POTASSIUM: 4.5 meq/L (ref 3.5–5.3)
Sodium: 141 mEq/L (ref 135–145)
Total Bilirubin: 0.6 mg/dL (ref 0.2–1.2)
Total Protein: 6.9 g/dL (ref 6.0–8.3)
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
## 2014-08-22 NOTE — Addendum Note (Signed)
Addended by: Dolan Amen D on: 08/22/2014 02:55 PM   Modules accepted: Orders

## 2014-08-22 NOTE — Addendum Note (Signed)
Addended by: Dolan Amen D on: 08/22/2014 03:05 PM   Modules accepted: Orders

## 2014-08-23 LAB — T-HELPER CELL (CD4) - (RCID CLINIC ONLY)
CD4 % Helper T Cell: 29 % — ABNORMAL LOW (ref 33–55)
CD4 T Cell Abs: 980 /uL (ref 400–2700)

## 2014-08-23 LAB — HIV-1 RNA QUANT-NO REFLEX-BLD: HIV 1 RNA Quant: <20 copies/mL (ref <20)

## 2014-09-04 ENCOUNTER — Ambulatory Visit: Payer: 59

## 2014-09-04 DIAGNOSIS — F4310 Post-traumatic stress disorder, unspecified: Secondary | ICD-10-CM

## 2014-09-04 NOTE — BH Specialist Note (Signed)
Shannon [name] was in a pleasant mood but reported that she got "written up" at work for helping a coworker sign up for her health insurance.  She said she will be on probation for 60 days and it will be on her employee record for one year.  This led to a discussion of how her whole life she has been doing things for other people, sometimes at her own expense.  I introduced the concept of "codependency" and explained that it means several things, but the gist of it is when people do things for others at their own expense.  I also gave her the DBT handout, "Cheerleading Statements" and she laughed through the whole thing, recognizing that she really has a problem doing things for herself and also with saying no.  Plan to meet again in 2 weeks.  Her "homework" is to find something to do just for herself. Curley Spice, LCSW  Whodas: [phone]

## 2014-09-05 ENCOUNTER — Ambulatory Visit: Payer: 59 | Admitting: Infectious Diseases

## 2014-09-10 ENCOUNTER — Ambulatory Visit (INDEPENDENT_AMBULATORY_CARE_PROVIDER_SITE_OTHER): Payer: 59 | Admitting: Infectious Diseases

## 2014-09-10 ENCOUNTER — Telehealth: Payer: Self-pay | Admitting: *Deleted

## 2014-09-10 ENCOUNTER — Encounter: Payer: Self-pay | Admitting: Infectious Diseases

## 2014-09-10 VITALS — BP 157/108 | HR 79 | Temp 98.7°F | Ht 62.0 in | Wt 201.0 lb

## 2014-09-10 DIAGNOSIS — F32A Depression, unspecified: Secondary | ICD-10-CM

## 2014-09-10 DIAGNOSIS — I10 Essential (primary) hypertension: Secondary | ICD-10-CM

## 2014-09-10 DIAGNOSIS — F329 Major depressive disorder, single episode, unspecified: Secondary | ICD-10-CM

## 2014-09-10 DIAGNOSIS — Z23 Encounter for immunization: Secondary | ICD-10-CM | POA: Diagnosis not present

## 2014-09-10 DIAGNOSIS — Z720 Tobacco use: Secondary | ICD-10-CM

## 2014-09-10 DIAGNOSIS — E041 Nontoxic single thyroid nodule: Secondary | ICD-10-CM | POA: Diagnosis not present

## 2014-09-10 DIAGNOSIS — R739 Hyperglycemia, unspecified: Secondary | ICD-10-CM

## 2014-09-10 DIAGNOSIS — B20 Human immunodeficiency virus [HIV] disease: Secondary | ICD-10-CM | POA: Diagnosis not present

## 2014-09-10 DIAGNOSIS — F17200 Nicotine dependence, unspecified, uncomplicated: Secondary | ICD-10-CM

## 2014-09-10 MED ORDER — SERTRALINE HCL 50 MG PO TABS: 50.0000 mg | ORAL_TABLET | Freq: Every day | ORAL | Status: DC

## 2014-09-10 NOTE — Addendum Note (Signed)
Addended by: Landis Gandy on: 09/10/2014 01:50 PM   Modules accepted: Orders, Medications

## 2014-09-10 NOTE — Assessment & Plan Note (Signed)
Normal last 2 measurements, will mark as resolved.

## 2014-09-10 NOTE — Assessment & Plan Note (Signed)
My great appreciation to Curley Spice for his expertise.

## 2014-09-10 NOTE — Assessment & Plan Note (Signed)
States she does not have $ to f/u.

## 2014-09-10 NOTE — Telephone Encounter (Signed)
Patient to call and let us know where she would like to go for PCP care. Landis Gandy, RN

## 2014-09-10 NOTE — Assessment & Plan Note (Signed)
>>  ASSESSMENT AND PLAN FOR THYROID  NODULE WRITTEN ON 09/10/2014 11:32 AM BY HATCHER, JEFFREY C, MD  States she does not have $ to f/u.

## 2014-09-10 NOTE — Progress Notes (Signed)
   Subjective:    Patient ID: Shannon [name], female    DOB: 1965-11-13, 49 y.o.   MRN: [id number]  HPI 49 yo F HIV+, previous Chairi 1 repair (2000). Currently taking atripla (her only rx). Previously had thyroid bx (2012?)- ? For cancerous cells, goiter. Has not had thyroid surgery. She states she had ENT eval who suggested that she did not need surgery. She had u/s 08-2012 that showed a stable multinodular goiter.  Normal pap June 2015.  At her previous visit she was started on zoloft. Does not feel like it has helped that much.  Has been seeing Shannon [name], feels like this helps. Her daughter sn home now, on multiple mental health meds. Also DM.  Has not gone back to f/u for her thyroid issues.  Hypertensive today.  Has registered for [facility].   HIV 1 RNA QUANT (copies/mL)  Date Value  08/22/2014 <20  02/21/2014 <20  10/05/2013 21   CD4 T CELL ABS (/uL)  Date Value  08/22/2014 980  02/21/2014 1050  10/05/2013 980   Review of Systems  Constitutional: Negative for appetite change and unexpected weight change.  Respiratory: Negative for shortness of breath.   Cardiovascular: Negative for chest pain.  Gastrointestinal: Negative for nausea and diarrhea.  Genitourinary: Negative for difficulty urinating.  Neurological: Positive for headaches.       Occipital ha that starts in her neck, very short.   Psychiatric/Behavioral: Positive for dysphoric mood.   Has cut down on her smoking. Uses as a way to get away from her desk at work.     Objective:   Physical Exam  Constitutional: She appears well-developed and well-nourished.  HENT:  Mouth/Throat: No oropharyngeal exudate.  Eyes: EOM are normal. Pupils are equal, round, and reactive to light.  Neck: Neck supple.  Cardiovascular: Normal rate, regular rhythm and normal heart sounds.   Pulmonary/Chest: Effort normal and breath sounds normal.  Abdominal: Soft. Bowel sounds are normal. There is no tenderness. There is no rebound.    Lymphadenopathy:    She has no cervical adenopathy.  Psychiatric: She exhibits a depressed mood.       Assessment & Plan:

## 2014-09-10 NOTE — Assessment & Plan Note (Signed)
Encouraged to quit.  Has secondary gain to taking "smoking breaks", i encouraged her to do this without smoking.

## 2014-09-10 NOTE — Assessment & Plan Note (Signed)
She is hypertensive today, if she remains so at her f/u will add ACE-I

## 2014-09-10 NOTE — Assessment & Plan Note (Addendum)
She's doing well.  Will continue her current art.  Offered/refused condoms.  Last pap 11-2013, needs mammo (gets at work) 3rd hep B of 2nd series today rtc 6 months

## 2014-09-18 ENCOUNTER — Ambulatory Visit: Payer: 59

## 2014-09-18 DIAGNOSIS — F32A Depression, unspecified: Secondary | ICD-10-CM

## 2014-09-18 DIAGNOSIS — F329 Major depressive disorder, single episode, unspecified: Secondary | ICD-10-CM
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
## 2014-10-02 ENCOUNTER — Ambulatory Visit: Payer: 59

## 2014-10-02 DIAGNOSIS — F4310 Post-traumatic stress disorder, unspecified: Secondary | ICD-10-CM

## 2014-10-02 NOTE — BH Specialist Note (Signed)
Shannon [name] reported that she has signed up for online classes at [employer] for this fall and is very excited about this.  She plans to take 4 classes and said she got a grant that will pay for it.  I praised her for following through with this.  She then said she is having moments when she will forget things or not know why she is doing some things.  As an example she said she put her keys in the refrigerator yesterday.  This led to my asking specific questions related to dissociation and she confirmed that she definitely dissociates often.  She admitted that she didn't know where the top she was wearing came from - she did not remember buying it.  She has moments when she doesn't recall what she did in the past couple of hours.  Then she said she is having a trauma memory from early childhood that recently popped up in her head and she doesn't know why.  I explained that our EMDR session a few weeks ago may have stirred things up.  She refers to having things "behind locked doors" and wishes this memory would go back.  I suggested she imagine it going back behind several doors and she recalled the old TV show, Get Smart, where he had to go behind several doors.  She was able to laugh and feel better about this.  Plan to meet in 2 weeks. Curley Spice, LCSW   Whodas: 15

## 2014-10-16 ENCOUNTER — Ambulatory Visit: Payer: 59

## 2014-10-30 ENCOUNTER — Ambulatory Visit: Payer: 59

## 2014-11-06 ENCOUNTER — Ambulatory Visit: Payer: 59

## 2014-11-14 ENCOUNTER — Encounter (INDEPENDENT_AMBULATORY_CARE_PROVIDER_SITE_OTHER): Payer: 59 | Admitting: *Deleted

## 2014-11-14 DIAGNOSIS — Z006 Encounter for examination for normal comparison and control in clinical research program: Secondary | ICD-10-CM

## 2014-11-14 LAB — COMPREHENSIVE METABOLIC PANEL
ALT: 20 U/L (ref 0–35)
AST: 17 U/L (ref 0–37)
Albumin: 4.1 g/dL (ref 3.5–5.2)
Alkaline Phosphatase: 84 U/L (ref 39–117)
BILIRUBIN TOTAL: 0.6 mg/dL (ref 0.2–1.2)
BUN: 9 mg/dL (ref 6–23)
CHLORIDE: 109 meq/L (ref 96–112)
CO2: 26 meq/L (ref 19–32)
Calcium: 9.4 mg/dL (ref 8.4–10.5)
Creat: 0.96 mg/dL (ref 0.50–1.10)
Glucose, Bld: 94 mg/dL (ref 70–99)
Potassium: 4.5 mEq/L (ref 3.5–5.3)
SODIUM: 144 meq/L (ref 135–145)
Total Protein: 6.9 g/dL (ref 6.0–8.3)

## 2014-11-17 NOTE — Progress Notes (Signed)
Shannon [name] was here for her month 12 Reprieve study visit. She says that she ran out of her pills early near the 5th of may. She had thrown the prior bottle out when we gave her a new bottle at the last visit, though she had been instructed to continue taking them. She also reports that she may have taken more than 1 pill on days when she couldn't remember if she had taken them. We counseled her on adherence and ways to improve compliance with her medication. She was given a new bottle of 90 pills and will run out before the next visit if she takes them properly. We instructed her to call before she runs out completely. She complains of knee pain associated with her arthritis but denies any muscle pain.

## 2014-11-21 ENCOUNTER — Ambulatory Visit: Payer: 59

## 2014-11-21 DIAGNOSIS — F4310 Post-traumatic stress disorder, unspecified: Secondary | ICD-10-CM

## 2014-11-21 NOTE — BH Specialist Note (Signed)
Shannon [name] talked about her ongoing frustrations with her job and how she feels that she is "burned out".  She said she is starting to put "feelers" out for other positions in the company.  I gave her the titles of 2 books on the subject - one about interviewing and one about finding your strengths.  She said she is already enrolled for classes at [employer] starting in August, which will be online.  She is worried that a new job coupled with full time classes may be exhausting.  Plan to meet again in 2 weeks. Curley Spice, LCSW

## 2014-12-04 ENCOUNTER — Ambulatory Visit: Payer: 59

## 2014-12-17 ENCOUNTER — Ambulatory Visit: Payer: 59

## 2014-12-17 DIAGNOSIS — F4310 Post-traumatic stress disorder, unspecified: Secondary | ICD-10-CM

## 2014-12-17 NOTE — BH Specialist Note (Signed)
Lavette was in a fairly good mood today, but talked about some of her frustrations with work.  She also talked about her concerns for her daughter, whom she says has Borderline Personality Disorder.  I gave her some information on DBT.  She expressed interest in doing EMDR again to help with her PTSD.  Plan to meet again in 2 weeks. Curley Spice, LCSW

## 2014-12-31 ENCOUNTER — Other Ambulatory Visit: Payer: Self-pay | Admitting: Licensed Clinical Social Worker

## 2014-12-31 ENCOUNTER — Ambulatory Visit: Payer: 59

## 2014-12-31 DIAGNOSIS — F4310 Post-traumatic stress disorder, unspecified: Secondary | ICD-10-CM

## 2014-12-31 MED ORDER — EMTRICITAB-RILPIVIR-TENOFOV DF 200-25-300 MG PO TABS: 1.0000 | ORAL_TABLET | Freq: Every day | ORAL | Status: DC
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
## 2015-01-14 ENCOUNTER — Ambulatory Visit: Payer: 59

## 2015-01-14 DIAGNOSIS — F4310 Post-traumatic stress disorder, unspecified: Secondary | ICD-10-CM
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
## 2015-01-30 ENCOUNTER — Ambulatory Visit: Payer: 59

## 2015-02-14 ENCOUNTER — Ambulatory Visit: Payer: 59

## 2015-02-14 DIAGNOSIS — F4310 Post-traumatic stress disorder, unspecified: Secondary | ICD-10-CM

## 2015-02-14 NOTE — BH Specialist Note (Signed)
Levette was in a good mood today, reporting that her classes at [employer] have started and though she is quite busy, she said she is really enjoying them and it seems to be helping her mood.  She also talked about a road trip she made with her daughter and mother to [place] for a family funeral, which was mostly a good experience.  Plan to meet again in 2 weeks. Curley Spice, LCSW

## 2015-02-28 ENCOUNTER — Ambulatory Visit: Payer: 59

## 2015-03-06 ENCOUNTER — Ambulatory Visit: Payer: 59

## 2015-03-06 DIAGNOSIS — F4310 Post-traumatic stress disorder, unspecified: Secondary | ICD-10-CM

## 2015-03-06 NOTE — BH Specialist Note (Signed)
Shannon [name] reported that she is exhausted lately, due to working full time and taking 4 online classes at [employer].  She said her daughter's issues have kept her from going home the past 2 nights, so she has stayed with a friend.  But she said she had a nightmare last night, so she is going home tonight.  She also talked about her daughter's father, who is trying to reconnect with her and her daughter.  Shannon [name] describes herself as "asexual", in part due to her poor history with men, her PTSD, and also her HIV status.  Plan to meet again in 2 weeks. Shannon Spice, LCSW

## 2015-03-14 ENCOUNTER — Ambulatory Visit: Payer: 59

## 2015-03-18 ENCOUNTER — Ambulatory Visit: Payer: 59

## 2015-03-18 ENCOUNTER — Encounter (INDEPENDENT_AMBULATORY_CARE_PROVIDER_SITE_OTHER): Payer: 59 | Admitting: *Deleted

## 2015-03-18 ENCOUNTER — Other Ambulatory Visit: Payer: 59

## 2015-03-18 VITALS — BP 140/96 | HR 89 | Temp 98.3°F | Resp 16 | Wt 208.8 lb

## 2015-03-18 DIAGNOSIS — Z006 Encounter for examination for normal comparison and control in clinical research program: Secondary | ICD-10-CM

## 2015-03-18 DIAGNOSIS — Z113 Encounter for screening for infections with a predominantly sexual mode of transmission: Secondary | ICD-10-CM

## 2015-03-18 DIAGNOSIS — B20 Human immunodeficiency virus [HIV] disease: Secondary | ICD-10-CM

## 2015-03-18 DIAGNOSIS — F4310 Post-traumatic stress disorder, unspecified: Secondary | ICD-10-CM

## 2015-03-18 LAB — CBC
HCT: 42.5 % (ref 36.0–46.0)
Hemoglobin: 14.3 g/dL (ref 12.0–15.0)
MCH: 28.7 pg (ref 26.0–34.0)
MCHC: 33.6 g/dL (ref 30.0–36.0)
MCV: 85.2 fL (ref 78.0–100.0)
MPV: 9.4 fL (ref 8.6–12.4)
Platelets: 220 10*3/uL (ref 150–400)
RBC: 4.99 MIL/uL (ref 3.87–5.11)
RDW: 14.7 % (ref 11.5–15.5)
WBC: 6.3 10*3/uL (ref 4.0–10.5)

## 2015-03-18 LAB — COMPREHENSIVE METABOLIC PANEL
ALK PHOS: 82 U/L (ref 33–130)
ALT: 19 U/L (ref 6–29)
AST: 16 U/L (ref 10–35)
Albumin: 3.9 g/dL (ref 3.6–5.1)
BUN: 9 mg/dL (ref 7–25)
CHLORIDE: 109 mmol/L (ref 98–110)
CO2: 27 mmol/L (ref 20–31)
CREATININE: 0.92 mg/dL (ref 0.50–1.05)
Calcium: 9.5 mg/dL (ref 8.6–10.4)
Glucose, Bld: 107 mg/dL — ABNORMAL HIGH (ref 65–99)
Potassium: 4 mmol/L (ref 3.5–5.3)
SODIUM: 143 mmol/L (ref 135–146)
TOTAL PROTEIN: 6.4 g/dL (ref 6.1–8.1)
Total Bilirubin: 0.5 mg/dL (ref 0.2–1.2)

## 2015-03-18 LAB — LIPID PANEL
Cholesterol: 190 mg/dL (ref 125–200)
HDL: 44 mg/dL — AB (ref >=46)
LDL Cholesterol: 131 mg/dL — ABNORMAL HIGH (ref <130)
Total CHOL/HDL Ratio: 4.3 Ratio (ref <=5.0)
Triglycerides: 75 mg/dL (ref <150)
VLDL: 15 mg/dL (ref <30)

## 2015-03-18 NOTE — BH Specialist Note (Signed)
Shannon [name] was in a pleasant mood today, reporting that she is managing her busy schedule and learning how to time manage better, going to school online and working full time.  She reports having nightmares lately and wants to try EMDR again to work on another trauma memory from when she was a child.  Plan to meet in 2 weeks. Shannon Spice, LCSW

## 2015-03-18 NOTE — Progress Notes (Signed)
Shannon [name] is here for her month 16 study visit for Reprieve, A Randomized Trial to Prevent Vascular Events in HIV (study drug is Pitavastatin 4mg  or placebo). She says she ran out of meds this past Thursday, but didn't bother to call us to let us know. Her dosing for study has gotten off schedule due to the fact that she had thrown out her old bottles when new ones were prescribed. We did instruct her to not do that and continue taking them until the bottles are empty and bring in all the bottles at her visits. We only gave her 3 months supply today, so I will call her when she should be running out to come in and get a new prescription. She says her stress and anxiety is better now since she has been going to see Grayland Ormond for counseling and started zoloft in April. She has also started going back to school at [employer] working on a business degree. She will return in February for the next study visit

## 2015-03-19 LAB — RPR

## 2015-03-20 LAB — HIV-1 RNA QUANT-NO REFLEX-BLD
HIV 1 RNA Quant: <20 copies/mL (ref <20)
HIV-1 RNA Quant, Log: <1.3 {Log} (ref <1.30)

## 2015-03-20 LAB — T-HELPER CELL (CD4) - (RCID CLINIC ONLY)
CD4 % Helper T Cell: 31 % — ABNORMAL LOW (ref 33–55)
CD4 T Cell Abs: 980 /uL (ref 400–2700)

## 2015-03-28 ENCOUNTER — Ambulatory Visit: Payer: 59

## 2015-04-03 ENCOUNTER — Ambulatory Visit: Payer: 59

## 2015-04-03 ENCOUNTER — Ambulatory Visit (INDEPENDENT_AMBULATORY_CARE_PROVIDER_SITE_OTHER): Payer: 59 | Admitting: Infectious Diseases

## 2015-04-03 VITALS — BP 143/81 | HR 86 | Temp 98.4°F | Ht 63.0 in | Wt 209.8 lb

## 2015-04-03 DIAGNOSIS — F329 Major depressive disorder, single episode, unspecified: Secondary | ICD-10-CM | POA: Diagnosis not present

## 2015-04-03 DIAGNOSIS — B20 Human immunodeficiency virus [HIV] disease: Secondary | ICD-10-CM

## 2015-04-03 DIAGNOSIS — F32A Depression, unspecified: Secondary | ICD-10-CM

## 2015-04-03 DIAGNOSIS — Z23 Encounter for immunization: Secondary | ICD-10-CM

## 2015-04-03 DIAGNOSIS — E041 Nontoxic single thyroid nodule: Secondary | ICD-10-CM

## 2015-04-03 DIAGNOSIS — Z113 Encounter for screening for infections with a predominantly sexual mode of transmission: Secondary | ICD-10-CM

## 2015-04-03 DIAGNOSIS — Z79899 Other long term (current) drug therapy: Secondary | ICD-10-CM

## 2015-04-03 MED ORDER — EMTRICITAB-RILPIVIR-TENOFOV DF 200-25-300 MG PO TABS: 1.0000 | ORAL_TABLET | Freq: Every day | ORAL | Status: DC

## 2015-04-03 MED ORDER — SERTRALINE HCL 50 MG PO TABS: 50.0000 mg | ORAL_TABLET | Freq: Every day | ORAL | Status: DC

## 2015-04-03 NOTE — Assessment & Plan Note (Signed)
>>  ASSESSMENT AND PLAN FOR THYROID  NODULE WRITTEN ON 04/03/2015  4:19 PM BY HATCHER, JEFFREY C, MD  Will refer her again to ENT

## 2015-04-03 NOTE — Assessment & Plan Note (Signed)
Will refer her again to ENT

## 2015-04-03 NOTE — Assessment & Plan Note (Signed)
She's doing very well Has been on odefsy since July Offered/refused condoms.  Will get her PAP She gets flu shot today Had mammo at work Offer colon rtc in 6 months

## 2015-04-03 NOTE — Assessment & Plan Note (Signed)
Will refill her zoloft Continues to f/u with Grayland Ormond

## 2015-04-03 NOTE — Progress Notes (Signed)
   Subjective:    Patient ID: Shannon [name], female    DOB: [birth year], 49 y.o.   MRN: [id number]  HPI 49 yo F HIV+, previous Chairi 1 repair (2000). Currently taking atripla (her only rx). Previously had thyroid bx (2012)- ? For cancerous cells, goiter. Has not had thyroid surgery. She states she had ENT eval who suggested that she did not need surgery. She had u/s 08-2012 that showed a stable multinodular goiter.  Normal pap June 2015.   Has been seeing Grayland Ormond, taking zoloft.  No problems with her ART.   HIV 1 RNA QUANT (copies/mL)  Date Value  03/18/2015 <20  08/22/2014 <20  02/21/2014 <20   CD4 T CELL ABS (/uL)  Date Value  03/18/2015 980  08/22/2014 980  02/21/2014 1050   has been having muscles aches- knees, neck, arms, calves, hips- for last 2 months. Has tried tylenol with minimal relief. Has tried warm bath.   Review of Systems  Constitutional: Negative for appetite change and unexpected weight change.  Gastrointestinal: Negative for diarrhea and constipation.  Genitourinary: Negative for hematuria, flank pain and difficulty urinating.  Musculoskeletal: Positive for myalgias and arthralgias.  Psychiatric/Behavioral: Positive for sleep disturbance.  has nocturia, 2x/night. Dog wakes her up at night.      Objective:   Physical Exam  Constitutional: She appears well-developed and well-nourished.  HENT:  Mouth/Throat: No oropharyngeal exudate.  Eyes: EOM are normal. Pupils are equal, round, and reactive to light.  Neck: Neck supple.  Cardiovascular: Normal rate, regular rhythm and normal heart sounds.   Pulmonary/Chest: Effort normal and breath sounds normal.  Abdominal: Soft. Bowel sounds are normal. There is no tenderness. There is no rebound.  Musculoskeletal: She exhibits no edema.  Lymphadenopathy:    She has no cervical adenopathy.  Psychiatric: She has a normal mood and affect.       Assessment & Plan:

## 2015-04-16 ENCOUNTER — Ambulatory Visit: Payer: 59

## 2015-04-16 DIAGNOSIS — F4310 Post-traumatic stress disorder, unspecified: Secondary | ICD-10-CM

## 2015-04-16 NOTE — BH Specialist Note (Signed)
Shannon [name] was in a good mood today and reported on several different aspects of her life, starting with her job.  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  She talked about her daughter, who is now in treatment and is doing better.  She also talked about how she had to drop one of her classes, but now feels better because she can handle it.  We discussed possibly doing another EMDR session later this fall.  Plan to meet again in 2 weeks. Curley Spice, LCSW

## 2015-04-29 ENCOUNTER — Ambulatory Visit: Payer: 59

## 2015-06-24 ENCOUNTER — Ambulatory Visit (INDEPENDENT_AMBULATORY_CARE_PROVIDER_SITE_OTHER): Payer: 59 | Admitting: Infectious Diseases

## 2015-06-24 ENCOUNTER — Encounter: Payer: Self-pay | Admitting: Infectious Diseases

## 2015-06-24 VITALS — BP 142/102 | HR 90 | Temp 98.3°F | Ht 62.0 in | Wt 210.0 lb

## 2015-06-24 DIAGNOSIS — Z113 Encounter for screening for infections with a predominantly sexual mode of transmission: Secondary | ICD-10-CM | POA: Diagnosis not present

## 2015-06-24 DIAGNOSIS — Z79899 Other long term (current) drug therapy: Secondary | ICD-10-CM | POA: Diagnosis not present

## 2015-06-24 DIAGNOSIS — B20 Human immunodeficiency virus [HIV] disease: Secondary | ICD-10-CM | POA: Diagnosis not present

## 2015-06-24 DIAGNOSIS — N200 Calculus of kidney: Secondary | ICD-10-CM | POA: Insufficient documentation

## 2015-06-24 DIAGNOSIS — R1012 Left upper quadrant pain: Secondary | ICD-10-CM | POA: Diagnosis not present

## 2015-06-24 HISTORY — DX: Calculus of kidney: N20.0

## 2015-06-24 LAB — COMPREHENSIVE METABOLIC PANEL
ALBUMIN: 4 g/dL (ref 3.6–5.1)
ALK PHOS: 80 U/L (ref 33–130)
ALT: 21 U/L (ref 6–29)
AST: 18 U/L (ref 10–35)
BILIRUBIN TOTAL: 0.4 mg/dL (ref 0.2–1.2)
BUN: 10 mg/dL (ref 7–25)
CALCIUM: 9.3 mg/dL (ref 8.6–10.4)
CO2: 25 mmol/L (ref 20–31)
Chloride: 108 mmol/L (ref 98–110)
Creat: 0.9 mg/dL (ref 0.50–1.05)
GLUCOSE: 93 mg/dL (ref 65–99)
POTASSIUM: 3.8 mmol/L (ref 3.5–5.3)
Sodium: 140 mmol/L (ref 135–146)
TOTAL PROTEIN: 6.7 g/dL (ref 6.1–8.1)

## 2015-06-24 LAB — CBC WITH DIFFERENTIAL/PLATELET
BASOS ABS: 0 10*3/uL (ref 0.0–0.1)
Basophils Relative: 0 % (ref 0–1)
Eosinophils Absolute: 0.1 10*3/uL (ref 0.0–0.7)
Eosinophils Relative: 1 % (ref 0–5)
HEMATOCRIT: 43.5 % (ref 36.0–46.0)
HEMOGLOBIN: 14.5 g/dL (ref 12.0–15.0)
LYMPHS ABS: 4.7 10*3/uL — AB (ref 0.7–4.0)
LYMPHS PCT: 53 % — AB (ref 12–46)
MCH: 28.5 pg (ref 26.0–34.0)
MCHC: 33.3 g/dL (ref 30.0–36.0)
MCV: 85.6 fL (ref 78.0–100.0)
MPV: 10.1 fL (ref 8.6–12.4)
Monocytes Absolute: 0.8 10*3/uL (ref 0.1–1.0)
Monocytes Relative: 9 % (ref 3–12)
NEUTROS ABS: 3.3 10*3/uL (ref 1.7–7.7)
NEUTROS PCT: 37 % — AB (ref 43–77)
Platelets: 209 10*3/uL (ref 150–400)
RBC: 5.08 MIL/uL (ref 3.87–5.11)
RDW: 14.6 % (ref 11.5–15.5)
WBC: 8.9 10*3/uL (ref 4.0–10.5)

## 2015-06-24 LAB — AMYLASE: Amylase: 64 U/L (ref 0–105)

## 2015-06-24 LAB — LIPID PANEL
Cholesterol: 195 mg/dL (ref 125–200)
HDL: 46 mg/dL (ref >=46)
LDL CALC: 120 mg/dL (ref <130)
TRIGLYCERIDES: 143 mg/dL (ref <150)
Total CHOL/HDL Ratio: 4.2 Ratio (ref <=5.0)
VLDL: 29 mg/dL (ref <30)

## 2015-06-24 LAB — LIPASE: Lipase: 26 U/L (ref 7–60)

## 2015-06-24 NOTE — Progress Notes (Signed)
   Subjective:    Patient ID: Shannon [name], female    DOB: [birth year], 50 y.o.   MRN: [id number]  HPI 50 yo F HIV+, previous Chairi 1 repair (2000). Prev taking atripla, changed to [facility] 2016. Previously had thyroid bx (2012)- ? For cancerous cells, goiter. Has not had thyroid surgery. She states she had ENT eval who suggested that she did not need surgery. She had u/s 08-2012 that showed a stable multinodular goiter.  Normal pap June 2015.  Here today as walk in for abd pain- for last 5 hours. Has L upper abd pain. Has had normal BM, passing flatus. No dysuria. No diet change. Still feels full from breakfast. Pain radiates to her back if she sits up or breathes deep. No fever or chills.   HIV 1 RNA QUANT (copies/mL)  Date Value  03/18/2015 <20  08/22/2014 <20  02/21/2014 <20   CD4 T CELL ABS (/uL)  Date Value  03/18/2015 980  08/22/2014 980  02/21/2014 1050      Review of Systems     Objective:   Physical Exam  Constitutional: She appears well-developed and well-nourished.  HENT:  Mouth/Throat: No oropharyngeal exudate.  Eyes: EOM are normal. Pupils are equal, round, and reactive to light.  Neck: Neck supple.  Cardiovascular: Normal rate, regular rhythm and normal heart sounds.   Pulmonary/Chest: Effort normal and breath sounds normal.  Abdominal: Soft. Bowel sounds are normal. There is no splenomegaly or hepatomegaly. There is no tenderness. There is CVA tenderness. There is no rebound.    Lymphadenopathy:    She has no cervical adenopathy.      Assessment & Plan:

## 2015-06-24 NOTE — Assessment & Plan Note (Signed)
Doing well, no change in ART for now.  See below

## 2015-06-24 NOTE — Assessment & Plan Note (Signed)
Will check UA, UCx, BCx, BHCG, CBC, CMP, amylase, lipase, CT abd.  My suspicion is that she has nephrolithiasis.  She does not have fever to suggest infection

## 2015-06-25 ENCOUNTER — Ambulatory Visit
Admission: RE | Admit: 2015-06-25 | Discharge: 2015-06-25 | Disposition: A | Discharge status: Home / Self Care | Payer: 59 | Source: Ambulatory Visit | Attending: Infectious Diseases | Admitting: Infectious Diseases

## 2015-06-25 DIAGNOSIS — R1012 Left upper quadrant pain: Secondary | ICD-10-CM

## 2015-06-25 LAB — URINALYSIS, ROUTINE W REFLEX MICROSCOPIC
Bilirubin Urine: NEGATIVE
Glucose, UA: NEGATIVE
Hgb urine dipstick: NEGATIVE
KETONES UR: NEGATIVE
LEUKOCYTES UA: NEGATIVE
NITRITE: NEGATIVE
PH: 6 (ref 5.0–8.0)
Protein, ur: NEGATIVE
SPECIFIC GRAVITY, URINE: 1.013 (ref 1.001–1.035)

## 2015-06-25 LAB — HIV-1 RNA QUANT-NO REFLEX-BLD
HIV 1 RNA QUANT: 146 {copies}/mL — AB (ref <20)
HIV-1 RNA QUANT, LOG: 2.16 {Log_copies}/mL — AB (ref <1.30)

## 2015-06-25 LAB — URINE CULTURE: Colony Count: >=100000

## 2015-06-25 LAB — POCT URINE PREGNANCY: PREG TEST UR: NEGATIVE

## 2015-06-25 LAB — RPR

## 2015-06-25 NOTE — Addendum Note (Signed)
Addended by: Landis Gandy on: 06/25/2015 02:11 PM   Modules accepted: Orders

## 2015-06-26 ENCOUNTER — Telehealth: Payer: Self-pay | Admitting: *Deleted

## 2015-06-26 LAB — URINE CYTOLOGY ANCILLARY ONLY
Chlamydia: NEGATIVE
NEISSERIA GONORRHEA: NEGATIVE

## 2015-06-26 LAB — T-HELPER CELL (CD4) - (RCID CLINIC ONLY)
CD4 T CELL ABS: 1460 /uL (ref 400–2700)
CD4 T CELL HELPER: 31 % — AB (ref 33–55)

## 2015-06-26 NOTE — Telephone Encounter (Signed)
Referred patient to [facility] per Dr. Johnnye Sima.  Their triage nurse will contact patient for work-in appointment.  Demographics, insurance, ct result, office note and labs faxed to [facility] [phone], attention Jeris Penta. Landis Gandy, RN

## 2015-07-01 ENCOUNTER — Telehealth: Payer: Self-pay | Admitting: *Deleted

## 2015-07-01 ENCOUNTER — Ambulatory Visit: Payer: 59 | Admitting: Infectious Diseases

## 2015-07-01 ENCOUNTER — Ambulatory Visit: Payer: 59

## 2015-07-01 NOTE — Telephone Encounter (Signed)
Rescheduled pt for 06/23/15 @ 0930 w/ Dr. Johnnye Sima.

## 2015-07-02 ENCOUNTER — Ambulatory Visit: Payer: 59

## 2015-07-02 DIAGNOSIS — F4310 Post-traumatic stress disorder, unspecified: Secondary | ICD-10-CM

## 2015-07-02 NOTE — BH Specialist Note (Signed)
Shannon [name] was in a pleasant mood today and caught me up on all that has happened since our last session back in the fall.  She currently is having pain in her side which her urologist is trying to figure out.  She has 2 kidney stones, but he says her pain is coming from another place.  She admitted that she puts things off and has been trying to ignore the pain.  We talked about this and when asked, she said the pain currently is at a "7" out of 10.  She also worries a good deal about her daughter, but enables her by doing things for her that her daughter should do for herself.  I mentioned that eventually we will need to return to doing EMDR and she agreed.  Plan to meet again in 2 weeks. Curley Spice, LCSW

## 2015-07-03 ENCOUNTER — Ambulatory Visit: Payer: 59 | Admitting: Infectious Diseases

## 2015-07-08 ENCOUNTER — Other Ambulatory Visit: Payer: Self-pay | Admitting: Infectious Diseases

## 2015-07-08 ENCOUNTER — Encounter: Payer: Self-pay | Admitting: Infectious Diseases

## 2015-07-08 ENCOUNTER — Ambulatory Visit (INDEPENDENT_AMBULATORY_CARE_PROVIDER_SITE_OTHER): Payer: 59 | Admitting: Infectious Diseases

## 2015-07-08 VITALS — BP 161/99 | HR 93 | Temp 98.4°F | Wt 209.0 lb

## 2015-07-08 DIAGNOSIS — F17200 Nicotine dependence, unspecified, uncomplicated: Secondary | ICD-10-CM

## 2015-07-08 DIAGNOSIS — B20 Human immunodeficiency virus [HIV] disease: Secondary | ICD-10-CM

## 2015-07-08 DIAGNOSIS — I10 Essential (primary) hypertension: Secondary | ICD-10-CM

## 2015-07-08 DIAGNOSIS — E041 Nontoxic single thyroid nodule: Secondary | ICD-10-CM

## 2015-07-08 DIAGNOSIS — N200 Calculus of kidney: Secondary | ICD-10-CM | POA: Diagnosis not present

## 2015-07-08 DIAGNOSIS — J209 Acute bronchitis, unspecified: Secondary | ICD-10-CM | POA: Insufficient documentation

## 2015-07-08 MED ORDER — BENAZEPRIL HCL 5 MG PO TABS
5.0000 mg | ORAL_TABLET | Freq: Every day | ORAL | Status: DC
Start: 2015-07-08 — End: 2015-08-22

## 2015-07-08 NOTE — Assessment & Plan Note (Signed)
Encouraged to quit. 

## 2015-07-08 NOTE — Assessment & Plan Note (Signed)
She appears to be doing well.  Will continue her current art rtc in 4-5 months

## 2015-07-08 NOTE — Assessment & Plan Note (Signed)
She still has some pain.  Will ask her to call and get repeat appt.

## 2015-07-08 NOTE — Progress Notes (Signed)
   Subjective:    Patient ID: Shannon [name], female    DOB: [birth year], 50 y.o.   MRN: [id number]  HPI 50 yo F HIV+, previous Chairi 1 repair (2000). Prev taking atripla, changed to [facility] 2016. Previously had thyroid bx (2012)- ? For cancerous cells, goiter. Has not had thyroid surgery. She states she had ENT eval who suggested that she did not need surgery. She had u/s 08-2012 that showed a stable multinodular goiter.  Normal pap June 2015.   She was seen earlier this month with a kidney stone. Was seen in [facility], medical rx.   Now she is in with bronchitis, had temp 102 on 1-28. Cough, yellow-green sputum, thick.  No sinus pain. Has had post-nasal drip.   HIV 1 RNA QUANT (copies/mL)  Date Value  06/24/2015 146*  03/18/2015 <20  08/22/2014 <20   CD4 T CELL ABS (/uL)  Date Value  06/24/2015 1460  03/18/2015 980  08/22/2014 980     Review of Systems  Constitutional: Positive for fever and appetite change. Negative for chills and unexpected weight change.  HENT: Positive for postnasal drip.   Respiratory: Positive for cough.   Gastrointestinal: Negative for diarrhea and constipation.  Genitourinary: Negative for difficulty urinating.       Objective:   Physical Exam  Constitutional: She appears well-developed and well-nourished.  HENT:  Mouth/Throat: No oropharyngeal exudate.  Eyes: EOM are normal. Pupils are equal, round, and reactive to light.  Neck: Neck supple.  Cardiovascular: Normal rate, regular rhythm and normal heart sounds.   Pulmonary/Chest: Effort normal. She has no wheezes. She has rhonchi in the right lower field. She has no rales.  Abdominal: Soft. Bowel sounds are normal. There is no tenderness. There is no rebound.  Musculoskeletal: She exhibits no edema.  Lymphadenopathy:    She has no cervical adenopathy.       Assessment & Plan:

## 2015-07-08 NOTE — Assessment & Plan Note (Signed)
Saw ENT and is to have f/u appt this month.

## 2015-07-08 NOTE — Assessment & Plan Note (Signed)
>>  ASSESSMENT AND PLAN FOR THYROID  NODULE WRITTEN ON 07/08/2015  4:22 PM BY HATCHER, JEFFREY C, MD  Saw ENT and is to have f/u appt this month.

## 2015-07-08 NOTE — Assessment & Plan Note (Signed)
Will start her on low dose ACE-I

## 2015-07-08 NOTE — Assessment & Plan Note (Signed)
Encouraged her to take OTC- alka seltzer colds, or mucinex, or robitussin. If not improved in next 48-72h, consider z-pack.

## 2015-07-11 ENCOUNTER — Emergency Department (HOSPITAL_BASED_OUTPATIENT_CLINIC_OR_DEPARTMENT_OTHER)
Admission: EM | Admit: 2015-07-11 | Discharge: 2015-07-11 | Disposition: A | Discharge status: Home / Self Care | Payer: 59 | Attending: Emergency Medicine | Admitting: Emergency Medicine

## 2015-07-11 ENCOUNTER — Encounter (HOSPITAL_BASED_OUTPATIENT_CLINIC_OR_DEPARTMENT_OTHER): Payer: Self-pay

## 2015-07-11 DIAGNOSIS — F17210 Nicotine dependence, cigarettes, uncomplicated: Secondary | ICD-10-CM | POA: Insufficient documentation

## 2015-07-11 DIAGNOSIS — Q0700 Arnold-Chiari syndrome without spina bifida or hydrocephalus: Secondary | ICD-10-CM | POA: Diagnosis not present

## 2015-07-11 DIAGNOSIS — R1012 Left upper quadrant pain: Secondary | ICD-10-CM | POA: Insufficient documentation

## 2015-07-11 DIAGNOSIS — B20 Human immunodeficiency virus [HIV] disease: Secondary | ICD-10-CM | POA: Insufficient documentation

## 2015-07-11 DIAGNOSIS — Z8639 Personal history of other endocrine, nutritional and metabolic disease: Secondary | ICD-10-CM | POA: Insufficient documentation

## 2015-07-11 DIAGNOSIS — I10 Essential (primary) hypertension: Secondary | ICD-10-CM | POA: Diagnosis not present

## 2015-07-11 DIAGNOSIS — Z79899 Other long term (current) drug therapy: Secondary | ICD-10-CM | POA: Diagnosis not present

## 2015-07-11 DIAGNOSIS — R109 Unspecified abdominal pain: Secondary | ICD-10-CM

## 2015-07-11 HISTORY — DX: Essential (primary) hypertension: I10

## 2015-07-11 HISTORY — DX: Arnold-Chiari syndrome without spina bifida or hydrocephalus: Q07.00

## 2015-07-11 LAB — CBC
HCT: 40 % (ref 36.0–46.0)
Hemoglobin: 13.4 g/dL (ref 12.0–15.0)
MCH: 28 pg (ref 26.0–34.0)
MCHC: 33.5 g/dL (ref 30.0–36.0)
MCV: 83.7 fL (ref 78.0–100.0)
PLATELETS: 211 10*3/uL (ref 150–400)
RBC: 4.78 MIL/uL (ref 3.87–5.11)
RDW: 14.6 % (ref 11.5–15.5)
WBC: 7.4 10*3/uL (ref 4.0–10.5)

## 2015-07-11 LAB — COMPREHENSIVE METABOLIC PANEL
ALBUMIN: 3.8 g/dL (ref 3.5–5.0)
ALK PHOS: 77 U/L (ref 38–126)
ALT: 22 U/L (ref 14–54)
ANION GAP: 3 — AB (ref 5–15)
AST: 21 U/L (ref 15–41)
BUN: 11 mg/dL (ref 6–20)
CALCIUM: 9 mg/dL (ref 8.9–10.3)
CO2: 27 mmol/L (ref 22–32)
CREATININE: 0.9 mg/dL (ref 0.44–1.00)
Chloride: 110 mmol/L (ref 101–111)
GFR calc non Af Amer: >60 mL/min (ref >60)
Glucose, Bld: 101 mg/dL — ABNORMAL HIGH (ref 65–99)
POTASSIUM: 4.2 mmol/L (ref 3.5–5.1)
SODIUM: 140 mmol/L (ref 135–145)
Total Bilirubin: 0.4 mg/dL (ref 0.3–1.2)
Total Protein: 7.1 g/dL (ref 6.5–8.1)

## 2015-07-11 LAB — LIPASE, BLOOD: Lipase: 34 U/L (ref 11–51)

## 2015-07-11 NOTE — ED Provider Notes (Signed)
CSN: [id number]     Arrival date & time 07/11/15  1300 History   First MD Initiated Contact with Patient 07/11/15 1320     Chief Complaint  Patient presents with  . Abdominal Pain    HPI    50 year old female presents today for evaluation of left upper quadrant pain. Patient has been seen in the beginning of January by primary care for this. She had a CT scan that showed 8 mm nonobstructing left renal calculus, no other acute findings. She was referred to urology, who referred her back to primary care as he did not feel this was the source of her pain. Patient has had routine labs with no significant findings since. She has been using tramadol at home that only slightly improves her symptoms. Patient describes it as both sharp and dull pain in her left upper quadrant. She reports that she feels that she has a " bubble" of gas and after burping or passing gas seems to relieve her symptoms. She does report some intermittent diarrhea, and profuse, only occasional, no blood or mucus. She denies any recent antibiotic exposure. Patient denies any vomiting, lower abdominal pain, vaginal discharge or bleeding. When asked if food or drink makes pain worse patient is unable to answer this question. She denies any recent significant weight gain or weight loss, fever, night sweats.     Past Medical History  Diagnosis Date  . HIV disease ([place])   . Multiple thyroid nodules     Fully functioning thyroid with no treatment indicated  . Hypertension   . Arnold-Chiari malformation [facility])    Past Surgical History  Procedure Laterality Date  . Abdominal hysterectomy    . Cholecystectomy    . Brain surgery     Family History  Problem Relation Age of Onset  . Asthma Mother   . Hypertension Mother   . Diabetes Mother   . Diabetes Father   . Cancer Father     colon at 98 yo   Social History  Substance Use Topics  . Smoking status: Current Every Day Smoker -- 0.50 packs/day for 25 years    Types:  Cigarettes  . Smokeless tobacco: Never Used  . Alcohol Use: 0.6 oz/week    1 Standard drinks or equivalent per week     Comment: rare   OB History    No data available     Review of Systems  All other systems reviewed and are negative.   Allergies  Shellfish allergy  Home Medications   Prior to Admission medications   Medication Sig Start Date End Date Taking? Authorizing Provider  benazepril (LOTENSIN) 5 MG tablet Take 1 tablet (5 mg total) by mouth daily. 07/08/15   Campbell Riches, MD  Emtricitab-Rilpivir-Tenofov DF 200-25-300 MG TABS Take 1 tablet by mouth daily. 04/03/15   Campbell Riches, MD  sertraline (ZOLOFT) 50 MG tablet Take 1 tablet (50 mg total) by mouth daily. Patient not taking: Reported on 07/08/2015 04/03/15   Campbell Riches, MD   BP 157/96 mmHg  Pulse 48  Temp(Src) 98.9 F (37.2 C) (Oral)  Resp 18  Ht 5\' 2"  (1.575 m)  Wt 94.348 kg  BMI 38.03 kg/m2  SpO2 99%   Physical Exam  Constitutional: She is oriented to person, place, and time. She appears well-developed and well-nourished.  HENT:  Head: Normocephalic and atraumatic.  Eyes: Conjunctivae are normal. Pupils are equal, round, and reactive to light. Right eye exhibits no discharge. Left eye exhibits  no discharge. No scleral icterus.  Neck: Normal range of motion. No JVD present. No tracheal deviation present.  Pulmonary/Chest: Effort normal. No stridor.  Abdominal: She exhibits no distension and no mass. There is tenderness. There is no rebound and no guarding.  Minor left upper quadrant pain to deep palpation  Musculoskeletal: Normal range of motion. She exhibits no edema or tenderness.  Neurological: She is alert and oriented to person, place, and time. Coordination normal.  Skin: Skin is warm and dry. No rash noted. No erythema. No pallor.  Psychiatric: She has a normal mood and affect. Her behavior is normal. Judgment and thought content normal.  Nursing note and vitals reviewed.     ED  Course  Procedures (including critical care time) Labs Review Labs Reviewed  COMPREHENSIVE METABOLIC PANEL - Abnormal; Notable for the following:    Glucose, Bld 101 (*)    Anion gap 3 (*)    All other components within normal limits  CBC  LIPASE, BLOOD    Imaging Review No results found. I have personally reviewed and evaluated these images and lab results as part of my medical decision-making.   EKG Interpretation None      MDM   Final diagnoses:  Abdominal pain, unspecified abdominal location   Labs: CBC CMP, lipase- no significant findings  Imaging: CT scan on 06/24/2014 shows kidney stone on the left, no other significant findings  Consults:   Therapeutics:  Discharge Meds:   Assessment/Plan:  50 year old female in no acute distress presents today with complaints of left upper quadrant pain. This is been present for several weeks, she's had CT scans numerous evaluations by different providers with no significant findings. Patient is only really tender to palpation of the left upper quadrant. She has normal laboratory analysis year, she was smiling and laughing throughout the exam she appears to be in no acute distress. She is afebrile, nontoxic with reassuring vital signs, no need for further evaluation and management here in the ED setting. Patient will be discharged with instructions to follow-up with her primary care provider for reevaluation and further management of this ongoing issue.         Okey Regal, PA-C 07/11/15 [id number]  Leo Grosser, MD 07/11/15 [phone]

## 2015-07-11 NOTE — ED Notes (Signed)
Pa  at bedside. 

## 2015-07-11 NOTE — ED Notes (Signed)
C/o left abd/flank pain, intermittent diarrhea x 3 weeks

## 2015-07-11 NOTE — ED Notes (Signed)
MD at bedside., leads placed for ekg testing, md informed of irregularity in radial pulse verses apical pulse, ekg discontinued, pt in nad, vss

## 2015-07-11 NOTE — Discharge Instructions (Signed)

## 2015-07-15 ENCOUNTER — Ambulatory Visit: Payer: 59

## 2015-07-15 ENCOUNTER — Encounter (INDEPENDENT_AMBULATORY_CARE_PROVIDER_SITE_OTHER): Payer: 59 | Admitting: *Deleted

## 2015-07-15 VITALS — Wt 208.5 lb

## 2015-07-15 DIAGNOSIS — Z006 Encounter for examination for normal comparison and control in clinical research program: Secondary | ICD-10-CM

## 2015-07-15 DIAGNOSIS — F4310 Post-traumatic stress disorder, unspecified: Secondary | ICD-10-CM

## 2015-07-15 NOTE — Progress Notes (Signed)
Shannon [name] here today for month 20 visit [phone]: A Randomized Trial to Prevent Vascular Events in HIV (The REPRIEVE Study). She forgot to bring her medication with her today. She agreed to call me with the pill count this afternoon. Continues to complain of (L) upper abdominal pain. States that pain is aggravated by food and gas. Was seen at [facility] last week for pain. No abnormal finds per patient. She was instructed to follow-up with her primary physician. Next appointment scheduled for November 11, 2015 at 8:30am.

## 2015-07-15 NOTE — BH Specialist Note (Signed)
Shannon [name] was in a good mood today, reporting that school is going well and she has learned how to time manage it.  She talked some about her daughter and about how she is trying not to take care of things for her daughter.  I helped her process some of this, discussing the difference between regular parenting and being to overbearing.  She wants to go back to using EMDR to help her process some past trauma and we agreed to do this next session.  Will meet again in 2 weeks. Curley Spice, LCSW

## 2015-07-29 ENCOUNTER — Ambulatory Visit: Payer: 59

## 2015-08-12 ENCOUNTER — Ambulatory Visit: Payer: 59

## 2015-08-14 ENCOUNTER — Other Ambulatory Visit: Payer: Self-pay | Admitting: *Deleted

## 2015-08-14 ENCOUNTER — Ambulatory Visit: Payer: 59

## 2015-08-14 DIAGNOSIS — B373 Candidiasis of vulva and vagina: Secondary | ICD-10-CM

## 2015-08-14 DIAGNOSIS — F4310 Post-traumatic stress disorder, unspecified: Secondary | ICD-10-CM

## 2015-08-14 DIAGNOSIS — B3731 Acute candidiasis of vulva and vagina: Secondary | ICD-10-CM

## 2015-08-14 MED ORDER — FLUCONAZOLE 100 MG PO TABS: 100.0000 mg | ORAL_TABLET | Freq: Every day | ORAL | Status: DC

## 2015-08-14 NOTE — BH Specialist Note (Signed)
Shannon [name] was in a pleasant mood today and talked about her ongoing frustrations with a coworker.  I pointed out the [place] meditation of "tonglen", where one breathes in the suffering of loved ones as well as enemies, and then breathes out positive energy toward them.  She said "that's deep!"  She wants to have another EMDR session next time, which is in 2 weeks. Curley Spice, LCSW

## 2015-08-19 ENCOUNTER — Ambulatory Visit: Payer: 59 | Admitting: Infectious Diseases

## 2015-08-22 ENCOUNTER — Encounter: Payer: Self-pay | Admitting: Family Medicine

## 2015-08-22 ENCOUNTER — Ambulatory Visit (INDEPENDENT_AMBULATORY_CARE_PROVIDER_SITE_OTHER): Payer: 59 | Admitting: Family Medicine

## 2015-08-22 VITALS — BP 149/88 | HR 80 | Temp 98.4°F | Resp 16 | Ht 62.0 in | Wt 207.0 lb

## 2015-08-22 DIAGNOSIS — Z87442 Personal history of urinary calculi: Secondary | ICD-10-CM

## 2015-08-22 DIAGNOSIS — B20 Human immunodeficiency virus [HIV] disease: Secondary | ICD-10-CM

## 2015-08-22 DIAGNOSIS — I10 Essential (primary) hypertension: Secondary | ICD-10-CM

## 2015-08-22 DIAGNOSIS — F17200 Nicotine dependence, unspecified, uncomplicated: Secondary | ICD-10-CM

## 2015-08-22 DIAGNOSIS — R10812 Left upper quadrant abdominal tenderness: Secondary | ICD-10-CM

## 2015-08-22 DIAGNOSIS — E041 Nontoxic single thyroid nodule: Secondary | ICD-10-CM

## 2015-08-22 DIAGNOSIS — E669 Obesity, unspecified: Secondary | ICD-10-CM

## 2015-08-22 LAB — TSH: TSH: 0.43 m[IU]/L

## 2015-08-22 LAB — POCT URINALYSIS DIP (DEVICE)
BILIRUBIN URINE: NEGATIVE
Glucose, UA: NEGATIVE mg/dL
HGB URINE DIPSTICK: NEGATIVE
Ketones, ur: NEGATIVE mg/dL
Leukocytes, UA: NEGATIVE
NITRITE: NEGATIVE
PH: 6 (ref 5.0–8.0)
Protein, ur: NEGATIVE mg/dL
Specific Gravity, Urine: 1.015 (ref 1.005–1.030)
UROBILINOGEN UA: 0.2 mg/dL (ref 0.0–1.0)

## 2015-08-22 MED ORDER — BENAZEPRIL HCL 10 MG PO TABS: 10.0000 mg | ORAL_TABLET | Freq: Every day | ORAL | Status: DC

## 2015-08-22 NOTE — Progress Notes (Signed)
Subjective:    Patient ID: Shannon [name], female    DOB: Mar 15, 1966, 50 y.o.   MRN: [id number]  HPI Shannon [name] [place], a 50 year old female with a history of HIV and hypertension. Shannon [name] has not had a PCP, she has been primarily been using Dr. Verlin Grills, infectious disease physician for all primary needs. She states that she has been followed by Dr. Johnnye Sima consistently and taking all medications consistently. She denies fever, fatigue, chest pain, shortness of breath, hair/skin changes, dyuria, nausea , vomiting, or diarrhea.   Shannon [name] also has a history of hypertension. She maintains that she is taking medication consistently.  Patient here for follow-up of elevated blood pressure. She is not exercising and is not adherent to low salt diet.  She does not check blood pressures at home.  Patient denies chest pain, fatigue, irregular heart beat, near-syncope, orthopnea, palpitations, syncope and tachypnea.  Cardiovascular risk factors include: obesity (BMI >= 30 kg/m2) and smoking/ tobacco exposure.   Patient also complains of periodic abdominal pain to left upper quadrant. Patient has been evaluated in the emergency department and at infectious disease for same issue. She had a previous CT scan on 06/25/2015  that showed 8 mm non-obstructing left renal calculus, and no other acute findings were identified. Patient states that pain is periodic and sharp at times. Patient denies fever, fatigue, nausea, vomiting, diarrhea, vaginal discharge, or vaginal bleeding. Patient was also evaluated in urology.   Past Medical History  Diagnosis Date  . HIV disease ([place])   . Multiple thyroid nodules     Fully functioning thyroid with no treatment indicated  . Hypertension   . Arnold-Chiari malformation [facility])    Past Surgical History  Procedure Laterality Date  . Abdominal hysterectomy    . Cholecystectomy    . Brain surgery      Immunization History  Administered Date(s)  Administered  . H1N1 09/24/2008  . Hepatitis B 07/28/2004, 01/21/2005, 12/14/2005  . Hepatitis B, adult 02/22/2014, 04/04/2014, 09/10/2014  . Influenza Split 04/28/2011  . Influenza Whole 04/19/2006, 03/23/2007, 04/26/2008, 03/13/2009, 05/05/2010  . Influenza, Seasonal, Injecte, Preservative Fre 02/21/2014  . Influenza,inj,Quad PF,36+ Mos 04/03/2015  . Pneumococcal Polysaccharide-23 02/04/2004, 03/13/2009   Social History   Social History  . Marital Status: Single    Spouse Name: N/A  . Number of Children: N/A  . Years of Education: N/A   Occupational History  . Not on file.   Social History Main Topics  . Smoking status: Current Every Day Smoker -- 0.50 packs/day for 25 years    Types: Cigarettes  . Smokeless tobacco: Never Used  . Alcohol Use: 0.6 oz/week    1 Standard drinks or equivalent per week     Comment: rare  . Drug Use: No  . Sexual Activity: Not on file     Comment: declined condoms   Other Topics Concern  . Not on file   Social History Narrative   Review of Systems  Constitutional: Negative.  Negative for unexpected weight change.  HENT: Negative.   Eyes: Negative.   Respiratory: Negative.   Cardiovascular: Negative.   Gastrointestinal: Negative.   Endocrine: Positive for polydipsia. Negative for polyphagia and polyuria.  Genitourinary: Negative.   Musculoskeletal: Negative.   Skin: Negative.   Allergic/Immunologic: Negative.   Neurological: Negative for dizziness, seizures, facial asymmetry and numbness.  Hematological: Negative.   Psychiatric/Behavioral: Negative.  Negative for suicidal ideas and sleep disturbance.       Objective:  Physical Exam  Constitutional: She is oriented to person, place, and time. She appears well-developed and well-nourished.  HENT:  Head: Normocephalic and atraumatic.  Right Ear: External ear normal.  Left Ear: External ear normal.  Mouth/Throat: Oropharynx is clear and moist.  Eyes: Conjunctivae and EOM are  normal. Pupils are equal, round, and reactive to light.  Neck: Normal range of motion. Neck supple.  Cardiovascular: Normal rate, regular rhythm, normal heart sounds and intact distal pulses.   Pulmonary/Chest: Effort normal and breath sounds normal.  Abdominal: There is tenderness in the left upper quadrant.  Musculoskeletal: Normal range of motion.  Neurological: She is alert and oriented to person, place, and time. She has normal reflexes.  Skin: Skin is warm and dry.  Psychiatric: She has a normal mood and affect. Her behavior is normal. Judgment and thought content normal.      BP 149/88 mmHg  Pulse 80  Temp(Src) 98.4 F (36.9 C) (Oral)  Resp 16  Ht 5\' 2"  (1.575 m)  Wt 207 lb (93.895 kg)  BMI 37.85 kg/m2 Assessment & Plan:  1. Essential hypertension Blood pressure is above goal on current medication regimen. Will increase benazepril to 10 mg daily. Patient will follow up in 1 month for a blood pressure check.  - benazepril (LOTENSIN) 10 MG tablet; Take 1 tablet (10 mg total) by mouth daily.  Dispense: 90 tablet; Refill: 0  2. Left upper quadrant abdominal tenderness Reviewed previous CT scan, which shows an 8 mm left ureter kidney stone. Patient was referred to [facility] for further evaluation. Patient maintains it was concluded that LUQ pain was not related to kidney stone. I will have patient sign a medical records release and I will review notes from [facility] as they become available. She is not having acute pain at presence.   3. History of kidney stones Reviewed urinalysis, no hematuria present.  Refer to #2  4. Human immunodeficiency virus (HIV) disease ([place]) Please follow up with Dr. Bobby Rumpf as previously scheduled.   5. Obesity Recommend a lowfat, low carbohydrate diet divided over 5-6 small meals, increase water intake to 6-8 glasses, and 150 minutes per week of cardiovascular exercise.   - Hemoglobin A1c  6. Thyroid nodule Patient has a  history of multiple thyroid nodules and has been asymptomatic.  - TSH  7. Tobacco dependence Smoking cessation instruction/counseling given:  counseled patient on the dangers of tobacco use, advised patient to stop smoking, and reviewed strategies to maximize success   Deniesha Stenglein M, FNP  RTC: 1 month for hypertension

## 2015-08-23 ENCOUNTER — Telehealth: Payer: Self-pay

## 2015-08-23 LAB — HEMOGLOBIN A1C
Hgb A1c MFr Bld: 6.1 % — ABNORMAL HIGH (ref <5.7)
Mean Plasma Glucose: 128 mg/dL — ABNORMAL HIGH (ref <117)

## 2015-08-23 NOTE — Telephone Encounter (Signed)
Called and left message for patient to return call, patient also Needs to be asked to come by and sign medical records release for [place] to get records from [facility]. Thanks!

## 2015-08-23 NOTE — Telephone Encounter (Signed)
-----   Message from Dorena Dew, [place] sent at 08/23/2015  6:42 AM EDT ----- Regarding: lab results Please inform patient that hemoglobin a1C is 6.1%, which is consistent with prediabetes. Recommend a lowfat, low carbohydrate diet divided over 5-6 small meals, increase water intake to 6-8 glasses, and 150 minutes per week of cardiovascular exercise.  Please follow up in office as previously scheduled.   Thanks  ----- Message -----    From: Lab in [facility] Interface    Sent: 08/23/2015   1:34 AM      To: Dorena Dew, FNP

## 2015-08-23 NOTE — Patient Instructions (Signed)
Hypertension Hypertension, commonly called high blood pressure, is when the force of blood pumping through your arteries is too strong. Your arteries are the blood vessels that carry blood from your heart throughout your body. A blood pressure reading consists of a higher number over a lower number, such as 110/72. The higher number (systolic) is the pressure inside your arteries when your heart pumps. The lower number (diastolic) is the pressure inside your arteries when your heart relaxes. Ideally you want your blood pressure below 120/80. Hypertension forces your heart to work harder to pump blood. Your arteries may become narrow or stiff. Having untreated or uncontrolled hypertension can cause heart attack, stroke, kidney disease, and other problems. RISK FACTORS Some risk factors for high blood pressure are controllable. Others are not.  Risk factors you cannot control include:   Race. You may be at higher risk if you are African American.  Age. Risk increases with age.  Gender. Men are at higher risk than women before age 45 years. After age 65, women are at higher risk than men. Risk factors you can control include:  Not getting enough exercise or physical activity.  Being overweight.  Getting too much fat, sugar, calories, or salt in your diet.  Drinking too much alcohol. SIGNS AND SYMPTOMS Hypertension does not usually cause signs or symptoms. Extremely high blood pressure (hypertensive crisis) may cause headache, anxiety, shortness of breath, and nosebleed. DIAGNOSIS To check if you have hypertension, your health care provider will measure your blood pressure while you are seated, with your arm held at the level of your heart. It should be measured at least twice using the same arm. Certain conditions can cause a difference in blood pressure between your right and left arms. A blood pressure reading that is higher than normal on one occasion does not mean that you need treatment. If  it is not clear whether you have high blood pressure, you may be asked to return on a different day to have your blood pressure checked again. Or, you may be asked to monitor your blood pressure at home for 1 or more weeks. TREATMENT Treating high blood pressure includes making lifestyle changes and possibly taking medicine. Living a healthy lifestyle can help lower high blood pressure. You may need to change some of your habits. Lifestyle changes may include:  Following the DASH diet. This diet is high in fruits, vegetables, and whole grains. It is low in salt, red meat, and added sugars.  Keep your sodium intake below 2,300 mg per day.  Getting at least 30-45 minutes of aerobic exercise at least 4 times per week.  Losing weight if necessary.  Not smoking.  Limiting alcoholic beverages.  Learning ways to reduce stress. Your health care provider may prescribe medicine if lifestyle changes are not enough to get your blood pressure under control, and if one of the following is true:  You are 18-59 years of age and your systolic blood pressure is above 140.  You are 60 years of age or older, and your systolic blood pressure is above 150.  Your diastolic blood pressure is above 90.  You have diabetes, and your systolic blood pressure is over 140 or your diastolic blood pressure is over 90.  You have kidney disease and your blood pressure is above 140/90.  You have heart disease and your blood pressure is above 140/90. Your personal target blood pressure may vary depending on your medical conditions, your age, and other factors. HOME CARE INSTRUCTIONS    Have your blood pressure rechecked as directed by your health care provider.   Take medicines only as directed by your health care provider. Follow the directions carefully. Blood pressure medicines must be taken as prescribed. The medicine does not work as well when you skip doses. Skipping doses also puts you at risk for  problems.  Do not smoke.   Monitor your blood pressure at home as directed by your health care provider. SEEK MEDICAL CARE IF:   You think you are having a reaction to medicines taken.  You have recurrent headaches or feel dizzy.  You have swelling in your ankles.  You have trouble with your vision. SEEK IMMEDIATE MEDICAL CARE IF:  You develop a severe headache or confusion.  You have unusual weakness, numbness, or feel faint.  You have severe chest or abdominal pain.  You vomit repeatedly.  You have trouble breathing. MAKE SURE YOU:   Understand these instructions.  Will watch your condition.  Will get help right away if you are not doing well or get worse.   This information is not intended to replace advice given to you by your health care provider. Make sure you discuss any questions you have with your health care provider.   Document Released: 05/25/2005 Document Revised: 10/09/2014 Document Reviewed: 03/17/2013 Elsevier Interactive Patient Education 2016 Elsevier Inc.  

## 2015-08-26 NOTE — Telephone Encounter (Signed)
Patient called back. I advised of labs and that she needs to eat low fat/ low carb diet over 5 to 6 small meals daily. Asked patient to drink 6 to 8 glasses of water daily and exercise daily 150 minutes a week of cardio. Asked patient to keep next scheduled appointment. Patient verbalized understanding and had no other questions at this time. Thanks!

## 2015-08-26 NOTE — Telephone Encounter (Signed)
Called and left message on home phone and on mobile phone for patient to return call and left callback number. Thanks!

## 2015-08-28 ENCOUNTER — Ambulatory Visit: Payer: 59

## 2015-09-05 ENCOUNTER — Ambulatory Visit: Payer: 59

## 2015-09-10 ENCOUNTER — Ambulatory Visit: Payer: 59

## 2015-09-11 ENCOUNTER — Ambulatory Visit: Payer: 59

## 2015-09-11 DIAGNOSIS — F4310 Post-traumatic stress disorder, unspecified: Secondary | ICD-10-CM

## 2015-09-11 NOTE — BH Specialist Note (Signed)
Shannon [name] was in a pleasant mood today and talked about several things, including her stress due to her busy schedule, which she said is causing her to bite her lip and chew her fingernails again.  I reminded her of mindfulness and encouraged her to take 2 minutes a day, using a meditation app (Insight timer).  She agreed that she could do this. She said she is handling things at work somewhat better.  She also said her daughter is doing better lately as well.  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  Plan to meet again in 2 weeks. Curley Spice, LCSW

## 2015-09-16 ENCOUNTER — Other Ambulatory Visit: Payer: 59

## 2015-09-16 DIAGNOSIS — B20 Human immunodeficiency virus [HIV] disease: Secondary | ICD-10-CM

## 2015-09-16 DIAGNOSIS — Z79899 Other long term (current) drug therapy: Secondary | ICD-10-CM

## 2015-09-16 DIAGNOSIS — Z113 Encounter for screening for infections with a predominantly sexual mode of transmission: Secondary | ICD-10-CM

## 2015-09-16 LAB — CBC
HCT: 42.5 % (ref 35.0–45.0)
Hemoglobin: 14.1 g/dL (ref 11.7–15.5)
MCH: 28.3 pg (ref 27.0–33.0)
MCHC: 33.2 g/dL (ref 32.0–36.0)
MCV: 85.2 fL (ref 80.0–100.0)
MPV: 9.8 fL (ref 7.5–12.5)
PLATELETS: 224 10*3/uL (ref 140–400)
RBC: 4.99 MIL/uL (ref 3.80–5.10)
RDW: 15.1 % — AB (ref 11.0–15.0)
WBC: 7.3 10*3/uL (ref 3.8–10.8)

## 2015-09-17 LAB — T-HELPER CELL (CD4) - (RCID CLINIC ONLY)
CD4 T CELL HELPER: 31 % — AB (ref 33–55)
CD4 T Cell Abs: 1340 /uL (ref 400–2700)

## 2015-09-17 LAB — LIPID PANEL
CHOL/HDL RATIO: 4.1 ratio (ref <=5.0)
CHOLESTEROL: 200 mg/dL (ref 125–200)
HDL: 49 mg/dL (ref >=46)
LDL CALC: 134 mg/dL — AB (ref <130)
Triglycerides: 84 mg/dL (ref <150)
VLDL: 17 mg/dL (ref <30)

## 2015-09-17 LAB — COMPREHENSIVE METABOLIC PANEL
ALT: 19 U/L (ref 6–29)
AST: 17 U/L (ref 10–35)
Albumin: 4.1 g/dL (ref 3.6–5.1)
Alkaline Phosphatase: 86 U/L (ref 33–130)
BUN: 10 mg/dL (ref 7–25)
CHLORIDE: 107 mmol/L (ref 98–110)
CO2: 27 mmol/L (ref 20–31)
CREATININE: 0.98 mg/dL (ref 0.50–1.05)
Calcium: 9.5 mg/dL (ref 8.6–10.4)
Glucose, Bld: 115 mg/dL — ABNORMAL HIGH (ref 65–99)
POTASSIUM: 3.9 mmol/L (ref 3.5–5.3)
SODIUM: 142 mmol/L (ref 135–146)
Total Bilirubin: 0.4 mg/dL (ref 0.2–1.2)
Total Protein: 7 g/dL (ref 6.1–8.1)

## 2015-09-17 LAB — URINE CYTOLOGY ANCILLARY ONLY
Chlamydia: NEGATIVE
NEISSERIA GONORRHEA: NEGATIVE

## 2015-09-17 LAB — HIV-1 RNA QUANT-NO REFLEX-BLD
HIV 1 RNA Quant: <20 copies/mL (ref <20)
HIV-1 RNA Quant, Log: <1.3 Log copies/mL (ref <1.30)

## 2015-09-17 LAB — RPR

## 2015-09-25 ENCOUNTER — Ambulatory Visit: Payer: 59

## 2015-10-09 ENCOUNTER — Ambulatory Visit (INDEPENDENT_AMBULATORY_CARE_PROVIDER_SITE_OTHER): Payer: 59 | Admitting: Infectious Diseases

## 2015-10-09 ENCOUNTER — Encounter: Payer: Self-pay | Admitting: Infectious Diseases

## 2015-10-09 VITALS — BP 155/110 | HR 79 | Temp 98.0°F | Ht 62.0 in | Wt 209.8 lb

## 2015-10-09 DIAGNOSIS — E041 Nontoxic single thyroid nodule: Secondary | ICD-10-CM

## 2015-10-09 DIAGNOSIS — F329 Major depressive disorder, single episode, unspecified: Secondary | ICD-10-CM | POA: Diagnosis not present

## 2015-10-09 DIAGNOSIS — F32A Depression, unspecified: Secondary | ICD-10-CM

## 2015-10-09 DIAGNOSIS — Z1231 Encounter for screening mammogram for malignant neoplasm of breast: Secondary | ICD-10-CM

## 2015-10-09 DIAGNOSIS — B20 Human immunodeficiency virus [HIV] disease: Secondary | ICD-10-CM

## 2015-10-09 DIAGNOSIS — Z0000 Encounter for general adult medical examination without abnormal findings: Secondary | ICD-10-CM

## 2015-10-09 DIAGNOSIS — I10 Essential (primary) hypertension: Secondary | ICD-10-CM | POA: Diagnosis not present

## 2015-10-09 MED ORDER — SERTRALINE HCL 50 MG PO TABS: 50.0000 mg | ORAL_TABLET | Freq: Every day | ORAL | Status: DC

## 2015-10-09 NOTE — Addendum Note (Signed)
Addended by: Lorne Skeens D on: 10/09/2015 12:25 PM   Modules accepted: Orders

## 2015-10-09 NOTE — Assessment & Plan Note (Signed)
>>  ASSESSMENT AND PLAN FOR THYROID  NODULE WRITTEN ON 10/09/2015 11:10 AM BY HATCHER, JEFFREY C, MD  She need needs endo/ent f/u

## 2015-10-09 NOTE — Assessment & Plan Note (Signed)
She will f/u with her PCP this month

## 2015-10-09 NOTE — Assessment & Plan Note (Signed)
She needs dental eval She needs PAP She needs mammo She needs colonoscopy She is otherwise doing well rtc in 6 months.

## 2015-10-09 NOTE — Assessment & Plan Note (Signed)
She need needs endo/ent f/u

## 2015-10-09 NOTE — Progress Notes (Signed)
   Subjective:    Patient ID: Shannon [name], female    DOB: September 01, 1965, 50 y.o.   MRN: [id number]  HPI 50 yo F HIV+, previous Chairi 1 repair (2000). Prev taking atripla, changed to [facility] 2016. Previously had thyroid bx (2012)- ? For cancerous cells, goiter. Has not had thyroid surgery. She states she had ENT eval who suggested that she did not need surgery. She had u/s 08-2012 that showed a stable multinodular goiter. No recent f/u.  Normal pap June 2015.  Had kidney stone 06-2014. Has been doing well.  Getting counseling from [place]. She is 2 days from completing her first year at 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  Had allergice rxn 2 weeks ago, "just not right since then"  HIV 1 RNA QUANT (copies/mL)  Date Value  09/16/2015 <20  06/24/2015 146*  03/18/2015 <20   CD4 T CELL ABS (/uL)  Date Value  09/16/2015 1340  06/24/2015 1460  03/18/2015 980   Needs PAP, colon (father had CA polyps at 71) and mammo.   Review of Systems  Constitutional: Positive for unexpected weight change. Negative for appetite change.  Respiratory: Negative for shortness of breath.   Cardiovascular: Negative for chest pain.  Gastrointestinal: Negative for diarrhea, constipation and blood in stool.  Genitourinary: Negative for difficulty urinating.  Neurological: Negative for headaches.  leg cramps     Objective:   Physical Exam  Constitutional: She appears well-developed and well-nourished.  HENT:  Mouth/Throat: No oropharyngeal exudate.  Eyes: EOM are normal. Pupils are equal, round, and reactive to light.  Neck: Neck supple. Thyromegaly present.  Cardiovascular: Normal rate, regular rhythm and normal heart sounds.   Pulmonary/Chest: Effort normal and breath sounds normal.  Abdominal: Soft. Bowel sounds are normal. There is no tenderness. There is no rebound.  Musculoskeletal: She exhibits no edema.       Assessment & Plan:

## 2015-10-10 ENCOUNTER — Encounter: Payer: Self-pay | Admitting: *Deleted

## 2015-10-16 ENCOUNTER — Telehealth: Payer: Self-pay | Admitting: *Deleted

## 2015-10-16 ENCOUNTER — Encounter: Payer: Self-pay | Admitting: *Deleted

## 2015-10-16 DIAGNOSIS — E041 Nontoxic single thyroid nodule: Secondary | ICD-10-CM

## 2015-10-16 NOTE — Telephone Encounter (Signed)
Dr Johnnye Sima is referring pt to Dr Buddy Duty for thyroid nodule.  Needing to arrange referral patient appt.

## 2015-10-21 ENCOUNTER — Ambulatory Visit: Payer: 59

## 2015-10-21 DIAGNOSIS — F329 Major depressive disorder, single episode, unspecified: Secondary | ICD-10-CM

## 2015-10-21 DIAGNOSIS — F32A Depression, unspecified: Secondary | ICD-10-CM

## 2015-10-21 NOTE — Telephone Encounter (Signed)
Dr. Cindra Eves office has not returned several messages. Changing to [place].

## 2015-10-21 NOTE — BH Specialist Note (Signed)
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  I praised her for this.  Then she told about an incident at work where she had an allergic reaction to tasting a dish in the cafeteria that had lobster sauce in it. 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 She said she immediately took [place].  Coworkers were trying to figure out what to do and asked if they should call 911.  She said she couldn't afford a hospital visit and asked them to let her breathe and stay calm.  She said the director of facility services came and told her that she just had an anxiety attack.  This upset Shannon [name] so much so that she didn't go back to work for a day or two.  I validated that this was handled improperly and that she had every right to be upset about this. I also pointed out that any incident that occurs at work is a Arts administrator" issue and there should have been an incident report made (which she said did not happen). Plan to meet again in 2 weeks. Curley Spice, LCSW

## 2015-10-21 NOTE — Addendum Note (Signed)
Addended by: Lorne Skeens D on: 10/21/2015 12:41 PM   Modules accepted: Orders

## 2015-10-25 ENCOUNTER — Ambulatory Visit: Payer: 59

## 2015-11-06 ENCOUNTER — Ambulatory Visit: Payer: 59

## 2015-11-11 ENCOUNTER — Encounter: Payer: Self-pay | Admitting: Infectious Diseases

## 2015-11-11 ENCOUNTER — Encounter: Payer: Self-pay | Admitting: Family Medicine

## 2015-11-11 ENCOUNTER — Ambulatory Visit (INDEPENDENT_AMBULATORY_CARE_PROVIDER_SITE_OTHER): Payer: 59 | Admitting: Family Medicine

## 2015-11-11 VITALS — BP 132/88 | HR 79 | Temp 98.5°F | Resp 16 | Ht 62.0 in | Wt 210.0 lb

## 2015-11-11 DIAGNOSIS — I10 Essential (primary) hypertension: Secondary | ICD-10-CM

## 2015-11-11 DIAGNOSIS — B20 Human immunodeficiency virus [HIV] disease: Secondary | ICD-10-CM

## 2015-11-11 DIAGNOSIS — F17200 Nicotine dependence, unspecified, uncomplicated: Secondary | ICD-10-CM | POA: Insufficient documentation

## 2015-11-11 DIAGNOSIS — F32A Depression, unspecified: Secondary | ICD-10-CM

## 2015-11-11 DIAGNOSIS — F329 Major depressive disorder, single episode, unspecified: Secondary | ICD-10-CM | POA: Diagnosis not present

## 2015-11-11 DIAGNOSIS — E049 Nontoxic goiter, unspecified: Secondary | ICD-10-CM | POA: Diagnosis not present

## 2015-11-11 DIAGNOSIS — E010 Iodine-deficiency related diffuse (endemic) goiter: Secondary | ICD-10-CM | POA: Insufficient documentation

## 2015-11-11 LAB — POCT URINALYSIS DIP (DEVICE)
BILIRUBIN URINE: NEGATIVE
Glucose, UA: NEGATIVE mg/dL
HGB URINE DIPSTICK: NEGATIVE
Ketones, ur: NEGATIVE mg/dL
Leukocytes, UA: NEGATIVE
Nitrite: NEGATIVE
PH: 6 (ref 5.0–8.0)
Protein, ur: NEGATIVE mg/dL
Specific Gravity, Urine: 1.01 (ref 1.005–1.030)
Urobilinogen, UA: 0.2 mg/dL (ref 0.0–1.0)

## 2015-11-11 MED ORDER — BENAZEPRIL HCL 10 MG PO TABS: 10.0000 mg | ORAL_TABLET | Freq: Every day | ORAL | Status: DC

## 2015-11-11 MED ORDER — BUSPIRONE HCL 5 MG PO TABS: 5.0000 mg | ORAL_TABLET | Freq: Two times a day (BID) | ORAL | Status: DC

## 2015-11-11 NOTE — Progress Notes (Signed)
Subjective:    Patient ID: Shannon [name], female    DOB: Dec 09, 1965, 50 y.o.   MRN: [id number]  HPI [name], a 50 year old female with a history of HIV and hypertension presents for a 3 month follow up.   Shannon [name] also has a history of hypertension. She maintains that she is taking medication consistently.  Patient here for follow-up of elevated blood pressure. She is not exercising and is not adherent to low salt diet.  She does not check blood pressures at home.  Patient denies chest pain, fatigue, irregular heart beat, near-syncope, orthopnea, palpitations, syncope and tachypnea.  Cardiovascular risk factors include: obesity (BMI >= 30 kg/m2) and smoking/ tobacco exposure.   Shannon [name] also reports a history of depression. She has been on Zoloft over the past several months with unsatisfactory results. She says that she has been meeting with counselor, Merita Norton at [facility] [place]. She complains of anhedonia and depressed mood.   She denies current suicidal and homicidal plan or intent.    Past Medical History  Diagnosis Date  . HIV disease ([place])   . Multiple thyroid nodules     Fully functioning thyroid with no treatment indicated  . Hypertension   . Arnold-Chiari malformation [facility])    Past Surgical History  Procedure Laterality Date  . Abdominal hysterectomy    . Cholecystectomy    . Brain surgery      Immunization History  Administered Date(s) Administered  . H1N1 09/24/2008  . Hepatitis B 07/28/2004, 01/21/2005, 12/14/2005  . Hepatitis B, adult 02/22/2014, 04/04/2014, 09/10/2014  . Influenza Split 04/28/2011  . Influenza Whole 04/19/2006, 03/23/2007, 04/26/2008, 03/13/2009, 05/05/2010  . Influenza, Seasonal, Injecte, Preservative Fre 02/21/2014  . Influenza,inj,Quad PF,36+ Mos 04/03/2015  . Pneumococcal Polysaccharide-23 02/04/2004, 03/13/2009   Social History   Social History  . Marital Status: Single    Spouse Name: N/A  . Number of Children:  N/A  . Years of Education: N/A   Occupational History  . Not on file.   Social History Main Topics  . Smoking status: Current Every Day Smoker -- 0.50 packs/day for 25 years    Types: Cigarettes  . Smokeless tobacco: Never Used  . Alcohol Use: 0.6 oz/week    1 Standard drinks or equivalent per week     Comment: rare  . Drug Use: No  . Sexual Activity: Not on file     Comment: declined condoms   Other Topics Concern  . Not on file   Social History Narrative   Review of Systems  Constitutional: Negative.  Negative for unexpected weight change.  HENT: Negative.   Eyes: Negative.   Respiratory: Negative.   Cardiovascular: Negative.   Gastrointestinal: Negative.   Endocrine: Negative for polydipsia, polyphagia and polyuria.  Genitourinary: Negative.   Musculoskeletal: Negative.   Skin: Negative.   Allergic/Immunologic: Negative.   Neurological: Negative for dizziness, seizures, facial asymmetry and numbness.  Hematological: Negative.   Psychiatric/Behavioral: Negative.  Negative for suicidal ideas and sleep disturbance.       Objective:   Physical Exam  Constitutional: She is oriented to person, place, and time. She appears well-developed and well-nourished.  HENT:  Head: Normocephalic and atraumatic.  Right Ear: External ear normal.  Left Ear: External ear normal.  Mouth/Throat: Oropharynx is clear and moist.  Eyes: Conjunctivae and EOM are normal. Pupils are equal, round, and reactive to light.  Neck: Normal range of motion. Neck supple. Thyromegaly present. No thyroid mass present.  Cardiovascular:  Normal rate, regular rhythm, normal heart sounds and intact distal pulses.   Pulmonary/Chest: Effort normal and breath sounds normal.  Musculoskeletal: Normal range of motion.  Neurological: She is alert and oriented to person, place, and time. She has normal reflexes.  Skin: Skin is warm and dry.  Psychiatric: She has a normal mood and affect. Her speech is normal and  behavior is normal. Judgment and thought content normal.      BP 132/88 mmHg  Pulse 79  Temp(Src) 98.5 F (36.9 C) (Oral)  Resp 16  Ht 5\' 2"  (1.575 m)  Wt 210 lb (95.255 kg)  BMI 38.40 kg/m2  SpO2 99% Assessment & Plan:  1. Essential hypertension Blood pressure at goal. Blood pressure is controlled on current medication regimen, continued.  - benazepril (LOTENSIN) 10 MG tablet; Take 1 tablet (10 mg total) by mouth daily.  Dispense: 90 tablet; Refill: 1 - Urinalysis Dipstick  2. Depression Will start a 1 month trial of Buspar 5 mg BID.  - busPIRone (BUSPAR) 5 MG tablet; Take 1 tablet (5 mg total) by mouth 2 (two) times daily.  Dispense: 60 tablet; Refill: 2 - Ambulatory referral to Psychology Depression screen [facility] 2/9 11/11/2015 11/11/2015 10/09/2015 08/22/2015 07/08/2015  Decreased Interest 1 0 0 1 1  Down, Depressed, Hopeless 1 0 0 0 1  PHQ - 2 Score 2 0 0 1 2  Altered sleeping 1 - - - 0  Tired, decreased energy - - - - 1  Change in appetite 1 - - - 0  Feeling bad or failure about yourself  0 - - - 1  Trouble concentrating 1 - - - 0  Moving slowly or fidgety/restless 0 - - - 0  Suicidal thoughts 0 - - - 0  PHQ-9 Score 5 - - - 4  Difficult doing work/chores Not difficult at all - - - Somewhat difficult     3. Human immunodeficiency virus (HIV) disease ([place]) Patient to follow up with Dr. Johnnye Sima as scheduled.  4. Thyromegaly Previous ultrasound on 08/19/2012, goiter stable. Will repeat thyroid ultrasound. Previous TSH wnl.  - US Soft Tissue Head/Neck; Future  5. Tobacco dependence Smoking cessation instruction/counseling given:  counseled patient on the dangers of tobacco use, advised patient to stop smoking, and reviewed strategies to maximize success     RTC: Follow up in 1 month for depression.  Kham Zuckerman M, FNP RTC: 1 month for hypertension

## 2015-11-11 NOTE — Patient Instructions (Addendum)
Referral to psychology  Will set up ultrasound appointmentDASH Eating Plan DASH stands for "Dietary Approaches to Stop Hypertension." The DASH eating plan is a healthy eating plan that has been shown to reduce high blood pressure (hypertension). Additional health benefits may include reducing the risk of type 2 diabetes mellitus, heart disease, and stroke. The DASH eating plan may also help with weight loss. WHAT DO I NEED TO KNOW ABOUT THE DASH EATING PLAN? For the DASH eating plan, you will follow these general guidelines:  Choose foods with a percent daily value for sodium of less than 5% (as listed on the food label).  Use salt-free seasonings or herbs instead of table salt or sea salt.  Check with your health care provider or pharmacist before using salt substitutes.  Eat lower-sodium products, often labeled as "lower sodium" or "no salt added."  Eat fresh foods.  Eat more vegetables, fruits, and low-fat dairy products.  Choose whole grains. Look for the word "whole" as the first word in the ingredient list.  Choose fish and skinless chicken or [place] more often than red meat. Limit fish, poultry, and meat to 6 oz (170 g) each day.  Limit sweets, desserts, sugars, and sugary drinks.  Choose heart-healthy fats.  Limit cheese to 1 oz (28 g) per day.  Eat more home-cooked food and less restaurant, buffet, and fast food.  Limit fried foods.  Cook foods using methods other than frying.  Limit canned vegetables. If you do use them, rinse them well to decrease the sodium.  When eating at a restaurant, ask that your food be prepared with less salt, or no salt if possible. WHAT FOODS CAN I EAT? Seek help from a dietitian for individual calorie needs. Grains Whole grain or whole wheat bread. Brown rice. Whole grain or whole wheat pasta. Quinoa, bulgur, and whole grain cereals. Low-sodium cereals. Corn or whole wheat flour tortillas. Whole grain cornbread. Whole grain crackers.  Low-sodium crackers. Vegetables Fresh or frozen vegetables (raw, steamed, roasted, or grilled). Low-sodium or reduced-sodium tomato and vegetable juices. Low-sodium or reduced-sodium tomato sauce and paste. Low-sodium or reduced-sodium canned vegetables.  Fruits All fresh, canned (in natural juice), or frozen fruits. Meat and Other Protein Products Ground beef (85% or leaner), grass-fed beef, or beef trimmed of fat. Skinless chicken or [place]. Ground chicken or [place]. Pork trimmed of fat. All fish and seafood. Eggs. Dried beans, peas, or lentils. Unsalted nuts and seeds. Unsalted canned beans. Dairy Low-fat dairy products, such as skim or 1% milk, 2% or reduced-fat cheeses, low-fat ricotta or cottage cheese, or plain low-fat yogurt. Low-sodium or reduced-sodium cheeses. Fats and Oils Tub margarines without trans fats. Light or reduced-fat mayonnaise and salad dressings (reduced sodium). Avocado. Safflower, olive, or canola oils. Natural peanut or almond butter. Other Unsalted popcorn and pretzels. The items listed above may not be a complete list of recommended foods or beverages. Contact your dietitian for more options. WHAT FOODS ARE NOT RECOMMENDED? Grains White bread. White pasta. White rice. Refined cornbread. Bagels and croissants. Crackers that contain trans fat. Vegetables Creamed or fried vegetables. Vegetables in a cheese sauce. Regular canned vegetables. Regular canned tomato sauce and paste. Regular tomato and vegetable juices. Fruits Dried fruits. Canned fruit in light or heavy syrup. Fruit juice. Meat and Other Protein Products Fatty cuts of meat. Ribs, chicken wings, bacon, sausage, bologna, salami, chitterlings, fatback, hot dogs, bratwurst, and packaged luncheon meats. Salted nuts and seeds. Canned beans with salt. Dairy Whole or 2% milk, cream, half-and-half,  and cream cheese. Whole-fat or sweetened yogurt. Full-fat cheeses or blue cheese. Nondairy creamers and whipped  toppings. Processed cheese, cheese spreads, or cheese curds. Condiments Onion and garlic salt, seasoned salt, table salt, and sea salt. Canned and packaged gravies. Worcestershire sauce. Tartar sauce. Barbecue sauce. Teriyaki sauce. Soy sauce, including reduced sodium. Steak sauce. Fish sauce. Oyster sauce. Cocktail sauce. Horseradish. Ketchup and mustard. Meat flavorings and tenderizers. Bouillon cubes. Hot sauce. Tabasco sauce. Marinades. Taco seasonings. Relishes. Fats and Oils Butter, stick margarine, lard, shortening, ghee, and bacon fat. Coconut, palm kernel, or palm oils. Regular salad dressings. Other Pickles and olives. Salted popcorn and pretzels. The items listed above may not be a complete list of foods and beverages to avoid. Contact your dietitian for more information. WHERE CAN I FIND MORE INFORMATION? National Heart, Lung, and Blood Institute: travelstabloid.com   This information is not intended to replace advice given to you by your health care provider. Make sure you discuss any questions you have with your health care provider.   Document Released: 05/14/2011 Document Revised: 06/15/2014 Document Reviewed: 03/29/2013 Elsevier Interactive Patient Education [employer].

## 2015-11-14 ENCOUNTER — Encounter (INDEPENDENT_AMBULATORY_CARE_PROVIDER_SITE_OTHER): Payer: Self-pay | Admitting: *Deleted

## 2015-11-14 VITALS — BP 127/91 | HR 75 | Temp 98.0°F | Resp 16 | Wt 212.0 lb

## 2015-11-14 DIAGNOSIS — Z006 Encounter for examination for normal comparison and control in clinical research program: Secondary | ICD-10-CM

## 2015-11-14 NOTE — Progress Notes (Signed)
Shannon [name] here for month 24 visit [phone]: A Randomized Trial to Prevent Vascular Events in HIV (The REPRIEVE Study). Complaining of intermittent leg cramps which started in the last month. States that she just recently started taking medication for her HTN. Stated that she was originally instructed to start her BP medication back in January by Dr Johnnye Sima. Then was instructed to increase her dose in March by her primary care provider. States that she did not start her medication until recently (May) when she got scared after going to the [facility] and checking her BP and the machine was unable to get a reading and instructed her to seek medical attention. No other complaints or concerns verbalized. Next study visit scheduled for 9/18 @ 9:00am.

## 2015-11-20 ENCOUNTER — Ambulatory Visit (HOSPITAL_COMMUNITY): Payer: 59

## 2015-11-22 ENCOUNTER — Encounter: Payer: Self-pay | Admitting: Internal Medicine

## 2015-12-12 ENCOUNTER — Telehealth: Payer: Self-pay

## 2015-12-13 ENCOUNTER — Ambulatory Visit: Payer: 59 | Admitting: Family Medicine

## 2016-01-06 ENCOUNTER — Ambulatory Visit: Payer: 59

## 2016-01-07 ENCOUNTER — Ambulatory Visit: Payer: 59

## 2016-01-09 ENCOUNTER — Ambulatory Visit: Payer: 59

## 2016-01-14 ENCOUNTER — Ambulatory Visit: Payer: 59

## 2016-01-14 DIAGNOSIS — F4310 Post-traumatic stress disorder, unspecified: Secondary | ICD-10-CM

## 2016-01-14 NOTE — BH Specialist Note (Signed)
Shannon [name] was very pleasant today, reporting that she continues to struggle with work.  She feels like her motivation has dropped considerably.  We talked about her long range plan of getting her associate's degree so that she can move into a new area in the company.  She said her daughter is making some progress and her brother is also doing much better.  We talked about the "law of attraction" and ways to focus on positive goals instead of staying stuck in "complain" mode.  Plan to meet again in one week. Curley Spice, LCSW

## 2016-01-21 ENCOUNTER — Ambulatory Visit: Payer: 59

## 2016-02-25 ENCOUNTER — Encounter (INDEPENDENT_AMBULATORY_CARE_PROVIDER_SITE_OTHER): Payer: 59 | Admitting: Licensed Clinical Social Worker

## 2016-02-25 VITALS — BP 135/92 | HR 89 | Temp 98.2°F | Resp 16 | Wt 215.0 lb

## 2016-02-25 DIAGNOSIS — Z006 Encounter for examination for normal comparison and control in clinical research program: Secondary | ICD-10-CM

## 2016-02-25 NOTE — Progress Notes (Signed)
Shannon [name] is here for week 28 reprieve study visit. She is doing well with no complaints. She states she has been taking her medications correctly and every day, she did admit to getting off track last month and not taking them consistently but is now taking them correctly everyday. She will return back in January.

## 2016-04-01 ENCOUNTER — Other Ambulatory Visit: Payer: 59

## 2016-04-03 ENCOUNTER — Other Ambulatory Visit: Payer: 59

## 2016-04-03 DIAGNOSIS — B20 Human immunodeficiency virus [HIV] disease: Secondary | ICD-10-CM

## 2016-04-03 LAB — COMPREHENSIVE METABOLIC PANEL
ALK PHOS: 76 U/L (ref 33–130)
ALT: 18 U/L (ref 6–29)
AST: 18 U/L (ref 10–35)
Albumin: 3.9 g/dL (ref 3.6–5.1)
BUN: 7 mg/dL (ref 7–25)
CALCIUM: 9.1 mg/dL (ref 8.6–10.4)
CHLORIDE: 109 mmol/L (ref 98–110)
CO2: 25 mmol/L (ref 20–31)
Creat: 1.05 mg/dL (ref 0.50–1.05)
GLUCOSE: 99 mg/dL (ref 65–99)
POTASSIUM: 4.3 mmol/L (ref 3.5–5.3)
Sodium: 142 mmol/L (ref 135–146)
Total Bilirubin: 0.5 mg/dL (ref 0.2–1.2)
Total Protein: 6.7 g/dL (ref 6.1–8.1)

## 2016-04-03 LAB — CBC
HEMATOCRIT: 43 % (ref 35.0–45.0)
Hemoglobin: 14.1 g/dL (ref 11.7–15.5)
MCH: 28.3 pg (ref 27.0–33.0)
MCHC: 32.8 g/dL (ref 32.0–36.0)
MCV: 86.2 fL (ref 80.0–100.0)
MPV: 9.2 fL (ref 7.5–12.5)
Platelets: 245 10*3/uL (ref 140–400)
RBC: 4.99 MIL/uL (ref 3.80–5.10)
RDW: 14.7 % (ref 11.0–15.0)
WBC: 6.8 10*3/uL (ref 3.8–10.8)

## 2016-04-03 LAB — T-HELPER CELL (CD4) - (RCID CLINIC ONLY)
CD4 % Helper T Cell: 30 % — ABNORMAL LOW (ref 33–55)
CD4 T Cell Abs: 1120 /uL (ref 400–2700)

## 2016-04-06 LAB — HIV-1 RNA ULTRAQUANT REFLEX TO GENTYP+: HIV 1 RNA Quant: <20 copies/mL (ref <20)

## 2016-04-15 ENCOUNTER — Ambulatory Visit: Payer: 59

## 2016-04-15 ENCOUNTER — Ambulatory Visit (INDEPENDENT_AMBULATORY_CARE_PROVIDER_SITE_OTHER): Payer: 59 | Admitting: Infectious Diseases

## 2016-04-15 VITALS — BP 145/95 | HR 94 | Wt 217.0 lb

## 2016-04-15 DIAGNOSIS — F17200 Nicotine dependence, unspecified, uncomplicated: Secondary | ICD-10-CM | POA: Diagnosis not present

## 2016-04-15 DIAGNOSIS — Z113 Encounter for screening for infections with a predominantly sexual mode of transmission: Secondary | ICD-10-CM

## 2016-04-15 DIAGNOSIS — B20 Human immunodeficiency virus [HIV] disease: Secondary | ICD-10-CM

## 2016-04-15 DIAGNOSIS — I10 Essential (primary) hypertension: Secondary | ICD-10-CM | POA: Diagnosis not present

## 2016-04-15 DIAGNOSIS — Z79899 Other long term (current) drug therapy: Secondary | ICD-10-CM

## 2016-04-15 DIAGNOSIS — F4310 Post-traumatic stress disorder, unspecified: Secondary | ICD-10-CM

## 2016-04-15 DIAGNOSIS — E010 Iodine-deficiency related diffuse (endemic) goiter: Secondary | ICD-10-CM | POA: Diagnosis not present

## 2016-04-15 DIAGNOSIS — R0789 Other chest pain: Secondary | ICD-10-CM

## 2016-04-15 NOTE — Assessment & Plan Note (Signed)
She is encouraged to take her rx.

## 2016-04-15 NOTE — Assessment & Plan Note (Addendum)
Refuses flu shot Needs pap Taking art.  Offered/refused condoms.  Offered [employer].  States she has had mammo rtc in 6 months

## 2016-04-15 NOTE — BH Specialist Note (Signed)
Shannon [name] came in today wearing a new hair piece that looked nice and I told her so. She is currently taking 12 hours of classes at [facility] [employer] and is stressed but said she is handling it. She also reported that her daughter is making tremendous strides with her psychiatric issues and Shannon [name] showed me a video of her daughter singing Felisa Bonier, which was a big step for her. Overall, she seems to be doing well but said she has needed to come in and process some things.  Plan to meet again in 3 weeks. Curley Spice, LCSW

## 2016-04-15 NOTE — Assessment & Plan Note (Signed)
Encouraged her to see CV She says she can't til 2018.

## 2016-04-15 NOTE — Assessment & Plan Note (Signed)
She is encouraged to quit.  She has started using e-cigs

## 2016-04-15 NOTE — Assessment & Plan Note (Signed)
Encouraged to f/u with endo

## 2016-04-15 NOTE — Progress Notes (Signed)
   Subjective:    Patient ID: Shannon [name], female    DOB: [birth year], 50 y.o.   MRN: [id number]  HPI 50 yo F HIV+, previous Chairi 1 repair (2000). Prev taking atripla, changed to [facility] 2016. Previously had thyroid bx (2012)- ? For cancerous cells, goiter. Has not had thyroid surgery. She states she had ENT eval who suggested that she did not need surgery. She had u/s 08-2012 that showed a stable multinodular goiter. No recent f/u.  Normal pap June 2015.  Had kidney stone 06-2014.  Getting counseling from [place]. Has f/u today (daughter made her go!). She is off meds- blood pressure, zoloft.   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   HIV 1 RNA Quant (copies/mL)  Date Value  04/03/2016 <20  09/16/2015 <20  06/24/2015 146 (H)   CD4 T Cell Abs (/uL)  Date Value  04/03/2016 1,120  09/16/2015 1,340  06/24/2015 1,460    Review of Systems  Constitutional: Negative for activity change.  Respiratory: Positive for cough. Negative for shortness of breath.   Cardiovascular: Positive for chest pain.  Neurological: Negative for headaches.  Psychiatric/Behavioral: Positive for dysphoric mood.  wt up.  Feels like she has air bubble in chest that won't come up. Not sure what makes it resolve. Has hx of L arm numbness, can't feel difference when she has CP. Sometimes has pain in arm with movement of neck. Seen prev by [facility] but her insurance wouldn't cover stress test.  Has not been able to see endo due to outstanding bill.    Believes she had flu already this year. Still coughing.     Objective:   Physical Exam  Constitutional: She appears well-developed and well-nourished.  HENT:  Mouth/Throat: No oropharyngeal exudate.  Eyes: EOM are normal. Pupils are equal, round, and reactive to light.  Neck: Neck supple.  Cardiovascular: Normal rate, regular rhythm and normal heart sounds.   Pulmonary/Chest: Effort normal and breath sounds normal.  Abdominal: Soft. Bowel sounds  are normal. There is no tenderness. There is no rebound.  Musculoskeletal: She exhibits no edema.  Lymphadenopathy:    She has no cervical adenopathy.      Assessment & Plan:

## 2016-04-24 ENCOUNTER — Ambulatory Visit: Payer: 59

## 2016-04-26 ENCOUNTER — Other Ambulatory Visit: Payer: Self-pay | Admitting: Infectious Diseases

## 2016-04-29 NOTE — Telephone Encounter (Signed)
ERROR

## 2016-05-06 ENCOUNTER — Ambulatory Visit: Payer: 59

## 2016-06-15 ENCOUNTER — Ambulatory Visit: Payer: 59 | Admitting: Family Medicine

## 2016-06-29 ENCOUNTER — Other Ambulatory Visit: Payer: Self-pay | Admitting: Family Medicine

## 2016-06-29 ENCOUNTER — Ambulatory Visit (INDEPENDENT_AMBULATORY_CARE_PROVIDER_SITE_OTHER): Payer: 59 | Admitting: Family Medicine

## 2016-06-29 ENCOUNTER — Encounter: Payer: Self-pay | Admitting: Gastroenterology

## 2016-06-29 ENCOUNTER — Encounter: Payer: Self-pay | Admitting: Family Medicine

## 2016-06-29 VITALS — BP 132/101 | HR 86 | Temp 98.3°F | Resp 18 | Ht 62.0 in | Wt 214.0 lb

## 2016-06-29 DIAGNOSIS — I10 Essential (primary) hypertension: Secondary | ICD-10-CM | POA: Diagnosis not present

## 2016-06-29 DIAGNOSIS — F331 Major depressive disorder, recurrent, moderate: Secondary | ICD-10-CM

## 2016-06-29 DIAGNOSIS — Z1211 Encounter for screening for malignant neoplasm of colon: Secondary | ICD-10-CM

## 2016-06-29 DIAGNOSIS — B20 Human immunodeficiency virus [HIV] disease: Secondary | ICD-10-CM | POA: Diagnosis not present

## 2016-06-29 DIAGNOSIS — E010 Iodine-deficiency related diffuse (endemic) goiter: Secondary | ICD-10-CM

## 2016-06-29 LAB — TSH: TSH: 0.34 mIU/L — ABNORMAL LOW

## 2016-06-29 MED ORDER — BUSPIRONE HCL 5 MG PO TABS: 5.0000 mg | ORAL_TABLET | Freq: Two times a day (BID) | ORAL | 5 refills | Status: DC

## 2016-06-29 MED ORDER — BENAZEPRIL HCL 10 MG PO TABS: 10.0000 mg | ORAL_TABLET | Freq: Every day | ORAL | 1 refills | Status: DC

## 2016-06-29 NOTE — Progress Notes (Signed)
Subjective:    Patient ID: Shannon [name], female    DOB: January 29, 1966, 51 y.o.   MRN: [id number]  HPI [name], a 51 year old female with a history of HIV and hypertension presents for a 6 month follow up.   [name] also has a history of hypertension. She maintains that she is not taking medication consistently.   She is not exercising and is not adherent to low salt diet.  She does not check blood pressures at home.  Patient denies chest pain, fatigue, irregular heart beat, near-syncope, orthopnea, palpitations, syncope and tachypnea.  Cardiovascular risk factors include: obesity (BMI >= 30 kg/m2) and smoking/ tobacco exposure.    [name] also reports a history of depression. She has been on Buspar over the past several months. She has not been taking medications consistently. She says that she has been meeting with counselor, Shannon [name] at [facility]. She complains of anhedonia and depressed mood.   She denies current suicidal and homicidal plan or intent.    Past Medical History:  Diagnosis Date  . Arnold-Chiari malformation ([place])   . HIV disease ([place])   . Hypertension   . Multiple thyroid nodules    Fully functioning thyroid with no treatment indicated   Past Surgical History:  Procedure Laterality Date  . ABDOMINAL HYSTERECTOMY    . BRAIN SURGERY    . CHOLECYSTECTOMY      Immunization History  Administered Date(s) Administered  . H1N1 09/24/2008  . Hepatitis B 07/28/2004, 01/21/2005, 12/14/2005  . Hepatitis B, adult 02/22/2014, 04/04/2014, 09/10/2014  . Influenza Split 04/28/2011  . Influenza Whole 04/19/2006, 03/23/2007, 04/26/2008, 03/13/2009, 05/05/2010  . Influenza, Seasonal, Injecte, Preservative Fre 02/21/2014  . Influenza,inj,Quad PF,36+ Mos 04/03/2015  . Pneumococcal Polysaccharide-23 02/04/2004, 03/13/2009   Social History   Social History  . Marital status: Single    Spouse name: N/A  . Number of children: N/A  . Years of  education: N/A   Occupational History  . Not on file.   Social History Main Topics  . Smoking status: Current Every Day Smoker    Packs/day: 0.50    Years: 25.00    Types: Cigarettes  . Smokeless tobacco: Never Used  . Alcohol use 0.6 oz/week    1 Standard drinks or equivalent per week     Comment: rare  . Drug use: No  . Sexual activity: Not on file     Comment: declined condoms   Other Topics Concern  . Not on file   Social History Narrative  . No narrative on file   Review of Systems  Constitutional: Negative.  Negative for unexpected weight change.  HENT: Negative.   Eyes: Negative.   Respiratory: Negative.   Cardiovascular: Negative.   Gastrointestinal: Negative.   Endocrine: Negative for polydipsia, polyphagia and polyuria.  Genitourinary: Negative.   Musculoskeletal: Negative.   Skin: Negative.   Allergic/Immunologic: Negative.   Neurological: Negative for dizziness, seizures, facial asymmetry and numbness.  Hematological: Negative.   Psychiatric/Behavioral: Negative.  Negative for sleep disturbance and suicidal ideas.       Objective:   Physical Exam  Constitutional: She is oriented to person, place, and time. She appears well-developed and well-nourished.  HENT:  Head: Normocephalic and atraumatic.  Right Ear: External ear normal.  Left Ear: External ear normal.  Mouth/Throat: Oropharynx is clear and moist.  Eyes: Conjunctivae and EOM are normal. Pupils are equal, round, and reactive to light.  Neck: Normal range of motion. Neck supple.  Thyromegaly present. No thyroid mass present.  Cardiovascular: Normal rate, regular rhythm, normal heart sounds and intact distal pulses.   Pulmonary/Chest: Effort normal and breath sounds normal.  Musculoskeletal: Normal range of motion.  Neurological: She is alert and oriented to person, place, and time. She has normal reflexes.  Skin: Skin is warm and dry.  Psychiatric: She has a normal mood and affect. Her speech  is normal and behavior is normal. Judgment and thought content normal.      BP (!) 132/101 (BP Location: Right Arm, Patient Position: Sitting, Cuff Size: Large)   Pulse 86   Temp 98.3 F (36.8 C) (Oral)   Resp 18   Ht 5\' 2"  (1.575 m)   Wt 214 lb (97.1 kg)   SpO2 98%   BMI 39.14 kg/m  Assessment & Plan:  1. Essential hypertension Diastolic blood pressure is not at goal on current medication regimen. There have been some compliance issues here. I have discussed with her the great importance of following the treatment plan exactly as directed in order to achieve a good medical outcome. - benazepril (LOTENSIN) 10 MG tablet; Take 1 tablet (10 mg total) by mouth daily.  Dispense: 90 tablet; Refill: 1  2. Colon cancer screening Patient has never had a colonoscopy.  - Ambulatory referral to Gastroenterology  3. Human immunodeficiency virus (HIV) disease ([place]) - Ambulatory referral to Infectious Disease  4. Moderate episode of recurrent major depressive disorder (HCC)  - busPIRone (BUSPAR) 5 MG tablet; Take 1 tablet (5 mg total) by mouth 2 (two) times daily.  Dispense: 60 tablet; Refill: 5 Depression screen [facility] 2/9 06/29/2016 06/29/2016 11/11/2015 11/11/2015 10/09/2015  Decreased Interest 1 0 1 0 0  Down, Depressed, Hopeless 1 0 1 0 0  PHQ - 2 Score 2 0 2 0 0  Altered sleeping 1 - 1 - -  Tired, decreased energy 1 - - - -  Change in appetite 0 - 1 - -  Feeling bad or failure about yourself  1 - 0 - -  Trouble concentrating 0 - 1 - -  Moving slowly or fidgety/restless 0 - 0 - -  Suicidal thoughts - - 0 - -  PHQ-9 Score 5 - 5 - -  Difficult doing work/chores - - Not difficult at all - -    5. Thyromegaly - TSH   RTC: 6 months for hypertension   The patient was given clear instructions to go to ER or return to medical center if symptoms do not improve, worsen or new problems develop. The patient verbalized understanding. Will notify patient with laboratory results.

## 2016-06-30 LAB — POCT URINALYSIS DIP (DEVICE)
BILIRUBIN URINE: NEGATIVE
GLUCOSE, UA: NEGATIVE mg/dL
KETONES UR: NEGATIVE mg/dL
LEUKOCYTES UA: NEGATIVE
Nitrite: NEGATIVE
Protein, ur: NEGATIVE mg/dL
Urobilinogen, UA: 0.2 mg/dL (ref 0.0–1.0)
pH: 5.5 (ref 5.0–8.0)

## 2016-06-30 LAB — T3, FREE: T3 FREE: 3 pg/mL (ref 2.3–4.2)

## 2016-06-30 LAB — T4, FREE: Free T4: 1 ng/dL (ref 0.8–1.8)

## 2016-07-01 ENCOUNTER — Other Ambulatory Visit: Payer: Self-pay | Admitting: Family Medicine

## 2016-07-02 ENCOUNTER — Telehealth: Payer: Self-pay

## 2016-07-02 NOTE — Telephone Encounter (Signed)
-----   Message from Dorena Dew, [place] sent at 07/01/2016  4:51 PM EST ----- Regarding: inquiry  Please call Ms. Bullhead to inquire weather patient has had a fine needle biopsy of the thyroid. If not I will order an ultrasound of head and neck to determine presence of nodule.   Thanks    ----- Message ----- From: Interface, [facility] Sent: 06/30/2016   7:37 PM To: Dorena Dew, FNP

## 2016-07-02 NOTE — Telephone Encounter (Signed)
Called and spoke with patient, she states she has had a fine needle biopsy of thyroid approximately 4 years ago.  Advised her that provider wants to do an ultrasound of head and neck to determine presence of nodule. [place] asked to hold off on this being order/scheduled now until she contacts her insurance company regarding payment of test.

## 2016-07-03 ENCOUNTER — Telehealth: Payer: Self-pay

## 2016-07-27 ENCOUNTER — Ambulatory Visit (HOSPITAL_COMMUNITY)
Admission: RE | Admit: 2016-07-27 | Discharge: 2016-07-27 | Disposition: A | Discharge status: Home / Self Care | Payer: 59 | Source: Ambulatory Visit | Attending: Family Medicine | Admitting: Family Medicine

## 2016-07-27 DIAGNOSIS — E010 Iodine-deficiency related diffuse (endemic) goiter: Secondary | ICD-10-CM | POA: Diagnosis present

## 2016-07-28 ENCOUNTER — Telehealth: Payer: Self-pay | Admitting: Infectious Diseases

## 2016-07-28 DIAGNOSIS — E042 Nontoxic multinodular goiter: Secondary | ICD-10-CM

## 2016-07-28 NOTE — Telephone Encounter (Signed)
Called pt to discuss her thyroid u/s Will set her up for endo appt to see if she can get bx.

## 2016-07-29 ENCOUNTER — Telehealth: Payer: Self-pay

## 2016-07-29 NOTE — Telephone Encounter (Signed)
[place], patient is asking about thyroid ultrasound results. Please let me know if there is anything we need to advise her. Thanks!

## 2016-07-31 ENCOUNTER — Telehealth: Payer: Self-pay | Admitting: *Deleted

## 2016-07-31 NOTE — Telephone Encounter (Signed)
Patient is for colonoscopy at [facility] with Dr.Danis on 08/24/16. After reviewing her chart I see she is on a "research drug", is that medication a blood thinner or could be a blood thinner? Please advise. Thank you, Robbin Myers,RN previsit nurse.

## 2016-08-03 NOTE — Telephone Encounter (Signed)
blood thinner question  Received: Today  Message Contents  Margot Ables Dasnoit, RN  Levonne Spiller, RN        Shannon [name] is not receiving a blood thinner on the study. She is either receiving Pitavastatin or placebo.

## 2016-08-24 ENCOUNTER — Encounter: Payer: 59 | Admitting: Gastroenterology

## 2016-08-28 ENCOUNTER — Ambulatory Visit (INDEPENDENT_AMBULATORY_CARE_PROVIDER_SITE_OTHER): Payer: 59 | Admitting: Endocrinology

## 2016-08-28 ENCOUNTER — Other Ambulatory Visit (INDEPENDENT_AMBULATORY_CARE_PROVIDER_SITE_OTHER): Payer: 59

## 2016-08-28 ENCOUNTER — Encounter: Payer: Self-pay | Admitting: Endocrinology

## 2016-08-28 VITALS — BP 136/106 | HR 89 | Ht 62.0 in | Wt 209.0 lb

## 2016-08-28 DIAGNOSIS — E042 Nontoxic multinodular goiter: Secondary | ICD-10-CM

## 2016-08-28 LAB — T4, FREE: FREE T4: 0.79 ng/dL (ref 0.60–1.60)

## 2016-08-28 LAB — TSH: TSH: 0.29 u[IU]/mL — ABNORMAL LOW (ref 0.35–4.50)

## 2016-08-28 LAB — T3, FREE: T3 FREE: 4.4 pg/mL — AB (ref 2.3–4.2)

## 2016-08-28 NOTE — Progress Notes (Signed)
Patient ID: Shannon [name], female   DOB: 11/20/1965, 51 y.o.   MRN: [id number]           Referring physician: Smith Robert, [place]  Reason for Appointment: Goiter, new consultation    History of Present Illness:   The patient's thyroid enlargement was first discovered in 2011, probably on a routine physical exam She initially was evaluated by a thyroid ultrasound and also at that time she had needle aspiration biopsy on the dominant nodules on each side, results as below  Previous records from her chart were reviewed in detail  She has had periodic follow-up exams and ultrasounds done but no repeat biopsy done; right-sided nodule showed predominantly microfollicular pattern.  She has had minimal difficulty with swallowing, she does feel the thyroid when she is trying to follow although is able to eat and drink without difficulty.   However she does feel a local sensation of pressure and tightness which has been more recently increasingly prominent This sensation is more prominent when she is looking down   Lab Results  Component Value Date   FREET4 1.0 06/29/2016   TSH 0.34 (L) 06/29/2016   TSH 0.43 08/22/2015    She has had an ultrasound exam in 07/2016 with the following results:  Single RIGHT solid nodule measuring 3.0 cm; Other 2 dimensions: 1.9 cm x 2.3 cm) and single LEFT (designated #3) thyroid nodule meet criteria for biopsy, as designated by the newly established ACR TI-RADS criteria, and referral for biopsy is recommended.  The nodules show increase in size compared to the ultrasound in 2014, at that time the left-sided nodule measured 3 cm x 2.5 cm and now this measures 3.4 cm x 3.3 cm.  Previous management of the right-sided nodule was 2.1 x 1.9 cm  Thyroid biopsy in 08/2009  THYROID, FINE NEEDLE ASPIRATION, RIGHT: Follicular epithelium in a predominantly microfollicular pattern. Based on this material, a follicular neoplasm can not be entirely ruled  out.  THYROID,FINE NEEDLE ASPIRATON,LEFT: Benign. Findings consistent with a non-neoplastic goiter.   Allergies as of 08/28/2016      Reactions   Shellfish Allergy Anaphylaxis   Shrimp      Medication List       Accurate as of 08/28/16  2:15 PM. Always use your most recent med list.          benazepril 10 MG tablet Commonly known as:  LOTENSIN Take 1 tablet (10 mg total) by mouth daily.   busPIRone 5 MG tablet Commonly known as:  BUSPAR Take 1 tablet (5 mg total) by mouth 2 (two) times daily.   COMPLERA 200-25-300 MG tablet Generic drug:  emtricitabine-rilpivir-tenofovir DF TAKE 1 TABLET BY MOUTH DAILY       Allergies:  Allergies  Allergen Reactions  . Shellfish Allergy Anaphylaxis    Shrimp     Past Medical History:  Diagnosis Date  . Arnold-Chiari malformation ([place])   . HIV disease ([place])   . Hypertension   . Multiple thyroid nodules    Fully functioning thyroid with no treatment indicated    Past Surgical History:  Procedure Laterality Date  . ABDOMINAL HYSTERECTOMY    . BRAIN SURGERY    . CHOLECYSTECTOMY      Family History  Problem Relation Age of Onset  . Asthma Mother   . Hypertension Mother   . Diabetes Mother   . Diabetes Father   . Cancer Father     colon at 72 yo    Social History:  reports that she has been smoking Cigarettes.  She has a 12.50 pack-year smoking history. She has never used smokeless tobacco. She reports that she drinks about 0.6 oz of alcohol per week . She reports that she does not use drugs.    Review of Systems  Constitutional: Positive for weight loss.  HENT: Positive for trouble swallowing.   Respiratory: Negative for shortness of breath.   Cardiovascular: Negative for palpitations and leg swelling.  Gastrointestinal: Negative for constipation.  Endocrine: Negative for fatigue, cold intolerance and heat intolerance.  Musculoskeletal: Negative for joint pain.  Skin: Negative for rash.  Neurological:  Negative for weakness and tremors.  Psychiatric/Behavioral: Negative for insomnia.     She has had hypertension and the pressure appears to be persistently high, she does not take her blood pressure medication regularly now Followed by PCP  BP Readings from Last 3 Encounters:  08/28/16 (!) 136/106  06/29/16 (!) 132/101  04/15/16 (!) 145/95   Has had some improvement in her weight, she has been trying to keep it down  Wt Readings from Last 3 Encounters:  08/28/16 209 lb (94.8 kg)  06/29/16 214 lb (97.1 kg)  04/15/16 217 lb (98.4 kg)    Examination:   BP (!) 136/106   Pulse 89   Ht 5\' 2"  (1.575 m)   Wt 209 lb (94.8 kg)   SpO2 99%   BMI 38.23 kg/m    General Appearance: pleasant,          Eyes: No abnormal prominence or eyelid swelling.          Neck: The thyroid is enlarged Bilaterally but mostly on the left. Right side is no distinct nodule, relatively soft Left side is enlarged including laterally and slightly lobulated, slightly firm Neck circumference is 41  cm over the thyroid There is no stridor. Pemberton sign is negative with the patient experiences choking on raising her hands over her head There is no lymphadenopathy.     Cardiovascular: Normal  heart sounds, no murmur Respiratory:  Lungs clear Neurological: REFLEXES: at biceps are slightly brisk No tremor.  Skin: no rash        Assessment/Plan:  Multinodular goiter, long-standing She has had increased size of the goiter and especially the dominant nodules 1 on each side Largest nodule 3.4 cm on the left She has had biopsy on the nodules but this was done in 2011 and showed  microfollicular pattern  Her TSH Recently is low normal indicating either autonomous thyroid or possibly development of a warm or hot nodule. Clinically not hyperthyroid Thyroid levels however will need to be checked again today including T3 level  Recommended that if her thyroid levels show subclinical hyperthyroidism or TSH is  still low will consider thyroid scan She may be a candidate for I-131 treatment if she has sufficient uptake and warm or hot nodule on the left  Otherwise patient would be recommended surgery.  The patient does not want to consider needle aspiration because of her previous experience with biopsy which caused excessive discomfort  HYPERTENSION: Patient was advised to take her blood pressure medication regularly and follow-up with PCP for further adjustment   Consultation note to go to PCP/referring physician   [facility] 08/28/2016  Laboratory results as follows:  Free T4 is normal and free T3 slightly high, TSH suppressed.  We will send the patient for nuclear scan and uptake  Lab on 08/28/2016  Component Date Value Ref Range Status  . TSH 08/28/2016 0.29* 0.35 -  4.50 uIU/mL Final  . Free T4 08/28/2016 0.79  0.60 - 1.60 ng/dL Final   Comment: Specimens from patients who are undergoing biotin therapy and /or ingesting biotin supplements may contain high levels of biotin.  The higher biotin concentration in these specimens interferes with this Free T4 assay.  Specimens that contain high levels  of biotin may cause false high results for this Free T4 assay.  Please interpret results in light of the total clinical presentation of the patient.    . T3, Free 08/28/2016 4.4* 2.3 - 4.2 pg/mL Final

## 2016-08-29 ENCOUNTER — Encounter: Payer: Self-pay | Admitting: Endocrinology

## 2016-09-09 ENCOUNTER — Encounter (HOSPITAL_COMMUNITY)
Admission: RE | Admit: 2016-09-09 | Discharge: 2016-09-09 | Disposition: A | Discharge status: Home / Self Care | Payer: 59 | Source: Ambulatory Visit | Attending: Endocrinology | Admitting: Endocrinology

## 2016-09-09 DIAGNOSIS — E042 Nontoxic multinodular goiter: Secondary | ICD-10-CM | POA: Diagnosis not present

## 2016-09-09 MED ORDER — SODIUM IODIDE I 131 CAPSULE
12.3900 | Freq: Once | INTRAVENOUS | Status: AC | PRN
  Administered 2016-09-09: 12.39 via ORAL

## 2016-09-10 ENCOUNTER — Encounter (HOSPITAL_COMMUNITY)
Admission: RE | Admit: 2016-09-10 | Discharge: 2016-09-10 | Disposition: A | Discharge status: Home / Self Care | Payer: 59 | Source: Ambulatory Visit | Attending: Endocrinology | Admitting: Endocrinology

## 2016-09-10 DIAGNOSIS — E042 Nontoxic multinodular goiter: Secondary | ICD-10-CM | POA: Diagnosis not present

## 2016-09-10 MED ORDER — SODIUM PERTECHNETATE TC 99M INJECTION
10.6000 | Freq: Once | INTRAVENOUS | Status: AC | PRN
  Administered 2016-09-10: 10.6 via INTRAVENOUS

## 2016-09-21 NOTE — Telephone Encounter (Signed)
Patient is calling for the results of her scan of her thyroid.please advise

## 2016-09-23 ENCOUNTER — Telehealth: Payer: Self-pay

## 2016-09-23 NOTE — Telephone Encounter (Signed)
Patient called wanting to know results from her thyroid scan and she wants to know what the next step is- please advise

## 2016-09-24 NOTE — Telephone Encounter (Signed)
There is no single area of overactive nodule and the overall uptake is not high enough to take radioactive iodine treatment, will need to continue following, no treatment at this time

## 2016-09-24 NOTE — Telephone Encounter (Signed)
Spoke with patient gave results and she stated an understanding

## 2016-09-28 ENCOUNTER — Ambulatory Visit (INDEPENDENT_AMBULATORY_CARE_PROVIDER_SITE_OTHER): Payer: 59

## 2016-09-28 ENCOUNTER — Other Ambulatory Visit (HOSPITAL_COMMUNITY)
Admission: RE | Admit: 2016-09-28 | Discharge: 2016-09-28 | Disposition: A | Discharge status: Home / Self Care | Payer: 59 | Source: Ambulatory Visit | Attending: Infectious Diseases | Admitting: Infectious Diseases

## 2016-09-28 ENCOUNTER — Ambulatory Visit (INDEPENDENT_AMBULATORY_CARE_PROVIDER_SITE_OTHER): Payer: 59 | Admitting: Internal Medicine

## 2016-09-28 ENCOUNTER — Other Ambulatory Visit: Payer: 59

## 2016-09-28 DIAGNOSIS — F4310 Post-traumatic stress disorder, unspecified: Secondary | ICD-10-CM

## 2016-09-28 DIAGNOSIS — B029 Zoster without complications: Secondary | ICD-10-CM | POA: Insufficient documentation

## 2016-09-28 DIAGNOSIS — B20 Human immunodeficiency virus [HIV] disease: Secondary | ICD-10-CM

## 2016-09-28 DIAGNOSIS — Z113 Encounter for screening for infections with a predominantly sexual mode of transmission: Secondary | ICD-10-CM | POA: Insufficient documentation

## 2016-09-28 DIAGNOSIS — Z79899 Other long term (current) drug therapy: Secondary | ICD-10-CM

## 2016-09-28 LAB — CBC
HCT: 42.2 % (ref 35.0–45.0)
HEMOGLOBIN: 14 g/dL (ref 11.7–15.5)
MCH: 28.3 pg (ref 27.0–33.0)
MCHC: 33.2 g/dL (ref 32.0–36.0)
MCV: 85.4 fL (ref 80.0–100.0)
MPV: 9 fL (ref 7.5–12.5)
Platelets: 232 10*3/uL (ref 140–400)
RBC: 4.94 MIL/uL (ref 3.80–5.10)
RDW: 14.8 % (ref 11.0–15.0)
WBC: 5.3 10*3/uL (ref 3.8–10.8)

## 2016-09-28 LAB — COMPREHENSIVE METABOLIC PANEL
ALK PHOS: 85 U/L (ref 33–130)
ALT: 20 U/L (ref 6–29)
AST: 17 U/L (ref 10–35)
Albumin: 4 g/dL (ref 3.6–5.1)
BUN: 8 mg/dL (ref 7–25)
CALCIUM: 9.4 mg/dL (ref 8.6–10.4)
CHLORIDE: 108 mmol/L (ref 98–110)
CO2: 26 mmol/L (ref 20–31)
Creat: 1.06 mg/dL — ABNORMAL HIGH (ref 0.50–1.05)
GLUCOSE: 98 mg/dL (ref 65–99)
POTASSIUM: 4.2 mmol/L (ref 3.5–5.3)
Sodium: 142 mmol/L (ref 135–146)
Total Bilirubin: 0.4 mg/dL (ref 0.2–1.2)
Total Protein: 7 g/dL (ref 6.1–8.1)

## 2016-09-28 LAB — LIPID PANEL
CHOL/HDL RATIO: 4.6 ratio (ref <5.0)
CHOLESTEROL: 190 mg/dL (ref <200)
HDL: 41 mg/dL — ABNORMAL LOW (ref >50)
LDL Cholesterol: 130 mg/dL — ABNORMAL HIGH (ref <100)
Triglycerides: 95 mg/dL (ref <150)
VLDL: 19 mg/dL (ref <30)

## 2016-09-28 MED ORDER — VALACYCLOVIR HCL 1 G PO TABS: 1000.0000 mg | ORAL_TABLET | Freq: Three times a day (TID) | ORAL | 0 refills | Status: DC

## 2016-09-28 MED ORDER — PREDNISONE 20 MG PO TABS: 20.0000 mg | ORAL_TABLET | Freq: Every day | ORAL | 0 refills | Status: DC

## 2016-09-28 NOTE — Progress Notes (Signed)
   Subjective:    Patient ID: Shannon [name], female    DOB: 12/13/1965, 51 y.o.   MRN: [id number]  HPI Here for a work in visit while getting labs.    Has had 2 days of ear swelling, blister lesions on upper chest, head in a dermatomal distribution.  Significant pain, tingling.     Review of Systems  Constitutional: Negative for fatigue.  Skin: Positive for rash.       Objective:   Physical Exam  Constitutional: She appears well-developed and well-nourished.  HENT:  Right ear with erythema around lesions, lesions on her head and upper chest in dermatomal distribution  Eyes: Right eye exhibits no discharge. Left eye exhibits no discharge.          Assessment & Plan:

## 2016-09-28 NOTE — BH Specialist Note (Signed)
Sheyann reported multiple recent stressors, including the death of her daughter's father. She took her daughter to his service in [place] and she found out that the woman he had complained about for years was actually his wife.She was also upset about a rash that has appeared on her head and chest. Her ear lobe was swollen and she is scheduled to see Dr. Megan Salon tomorrow. I informed her of my leaving and we rescheduled for next week. Curley Spice, LCSW

## 2016-09-28 NOTE — Addendum Note (Signed)
Addended by: Dolan Amen D on: 09/28/2016 03:23 PM   Modules accepted: Orders

## 2016-09-28 NOTE — Assessment & Plan Note (Signed)
Will give valtrex and steroids.  If she has protracted discomfort after 1-2 more weeks can start neurontin

## 2016-09-29 ENCOUNTER — Ambulatory Visit: Payer: 59 | Admitting: Internal Medicine

## 2016-09-29 LAB — URINE CYTOLOGY ANCILLARY ONLY
CHLAMYDIA, DNA PROBE: NEGATIVE
NEISSERIA GONORRHEA: NEGATIVE

## 2016-09-29 LAB — T-HELPER CELL (CD4) - (RCID CLINIC ONLY)
CD4 T CELL HELPER: 27 % — AB (ref 33–55)
CD4 T Cell Abs: 660 /uL (ref 400–2700)

## 2016-09-29 LAB — RPR

## 2016-09-30 LAB — HIV-1 RNA QUANT-NO REFLEX-BLD
HIV 1 RNA QUANT: NOT DETECTED {copies}/mL
HIV-1 RNA QUANT, LOG: NOT DETECTED {Log_copies}/mL

## 2016-10-07 ENCOUNTER — Encounter (INDEPENDENT_AMBULATORY_CARE_PROVIDER_SITE_OTHER): Payer: 59 | Admitting: *Deleted

## 2016-10-07 ENCOUNTER — Encounter: Payer: Self-pay | Admitting: Infectious Diseases

## 2016-10-07 ENCOUNTER — Ambulatory Visit (INDEPENDENT_AMBULATORY_CARE_PROVIDER_SITE_OTHER): Payer: 59 | Admitting: Infectious Diseases

## 2016-10-07 ENCOUNTER — Ambulatory Visit: Payer: 59

## 2016-10-07 VITALS — BP 131/87 | HR 76 | Temp 98.8°F | Wt 207.2 lb

## 2016-10-07 VITALS — BP 133/89 | HR 64 | Temp 99.1°F | Ht 65.0 in | Wt 207.0 lb

## 2016-10-07 DIAGNOSIS — B029 Zoster without complications: Secondary | ICD-10-CM | POA: Diagnosis not present

## 2016-10-07 DIAGNOSIS — B20 Human immunodeficiency virus [HIV] disease: Secondary | ICD-10-CM | POA: Diagnosis not present

## 2016-10-07 DIAGNOSIS — F3289 Other specified depressive episodes: Secondary | ICD-10-CM | POA: Diagnosis not present

## 2016-10-07 DIAGNOSIS — Z006 Encounter for examination for normal comparison and control in clinical research program: Secondary | ICD-10-CM

## 2016-10-07 DIAGNOSIS — E041 Nontoxic single thyroid nodule: Secondary | ICD-10-CM

## 2016-10-07 DIAGNOSIS — F17200 Nicotine dependence, unspecified, uncomplicated: Secondary | ICD-10-CM

## 2016-10-07 NOTE — Assessment & Plan Note (Signed)
She has f/u with Grayland Ormond in AM.

## 2016-10-07 NOTE — Assessment & Plan Note (Signed)
Resolved

## 2016-10-07 NOTE — Progress Notes (Signed)
   Subjective:    Patient ID: Shannon [name], female    DOB: 03/23/1966, 51 y.o.   MRN: [id number]  HPI 51 yo F HIV+, previous Chairi 1 repair (2000). Prev taking atripla, changed to [facility] 2016. Previously had thyroid bx (2012)- ? For cancerous cells, goiter. Has not had thyroid surgery. She states she had ENT eval who suggested that she did not need surgery. She had u/s 08-2012 that showed a stable multinodular goiter. No recent f/u.  Normal pap June 2015.  Had kidney stone 06-2014.  Getting counseling from [place]. Has f/u today (daughter made her go!). She is off meds- blood pressure, zoloft.   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  She was seen in [facility] 09-28-16 for VZV outbreak. Was given steroids/vtx.  This has resolved.  Has not had PAP or colon (scheduled, rescheduled) She had thyroid scan on 4-5 showing: Multinodular thyroid gland demonstrating small warm and cold nodules in both lobes. She has not yet had f/u.  Wt has been steady, back on BP rx. Off zoloft but will restart (had refilled).    HIV 1 RNA Quant (copies/mL)  Date Value  09/28/2016 <20 NOT DETECTED  04/03/2016 <20  09/16/2015 <20   CD4 T Cell Abs (/uL)  Date Value  09/28/2016 660  04/03/2016 1,120  09/16/2015 1,340    Review of Systems  Constitutional: Negative for appetite change, chills, fever and unexpected weight change.  HENT: Positive for trouble swallowing. Negative for voice change.   Respiratory: Negative for cough and shortness of breath.   Cardiovascular: Positive for palpitations.  Gastrointestinal: Negative for constipation and diarrhea.  Genitourinary: Negative for difficulty urinating.  Neurological: Negative for headaches.  Psychiatric/Behavioral: Positive for dysphoric mood.  has rouble breathing at night, has to keep her pillow a "certain way" Has appt with Grayland Ormond 10-08-16    Objective:   Physical Exam  Constitutional: She appears well-developed and well-nourished.    HENT:  Mouth/Throat: No oropharyngeal exudate.  Eyes: EOM are normal. Pupils are equal, round, and reactive to light.  Neck: Neck supple. Thyromegaly present.  Cardiovascular: Normal rate, regular rhythm and normal heart sounds.   Pulmonary/Chest: Effort normal and breath sounds normal.  Abdominal: Soft. Bowel sounds are normal. There is no tenderness. There is no rebound.  Musculoskeletal: She exhibits no edema.  Lymphadenopathy:    She has no cervical adenopathy.      Assessment & Plan:

## 2016-10-07 NOTE — Assessment & Plan Note (Addendum)
Encouraged to quit! Will do insurance sponsored quit program this summer

## 2016-10-07 NOTE — Assessment & Plan Note (Signed)
>>  ASSESSMENT AND PLAN FOR THYROID  NODULE WRITTEN ON 10/07/2016 11:20 AM BY HATCHER, JEFFREY C, MD  Will get her back in with ENT.

## 2016-10-07 NOTE — Assessment & Plan Note (Signed)
Will get her back in with ENT.

## 2016-10-07 NOTE — Progress Notes (Signed)
Shannon [name] is here for her month 25 visit for Reprieve, A Randomized Trial to Prevent Vascular Events in HIV (study drug is Pitavastatin 4mg  or placebo). She had originally decided to come off study since she had stopped taking her study meds around Christmas and was dealing with psych issues but is in a much better place now and wants to resume the meds and continue on study. She is concerned about some nodules on her thyroid that have been evaluated recently and do not look like they need treatment currently. She is also to see Dr. Johnnye Sima later today. She will return in September for the next visit.

## 2016-10-07 NOTE — Assessment & Plan Note (Signed)
Will get her sched for PAP.  Gets mammogram at work She has colon scheduled.  Offered/refused condoms.  Will continue her current meds.  menveo rtc in 6 months

## 2016-10-08 ENCOUNTER — Ambulatory Visit: Payer: 59

## 2016-11-21 ENCOUNTER — Other Ambulatory Visit: Payer: Self-pay | Admitting: Family Medicine

## 2016-11-21 DIAGNOSIS — F329 Major depressive disorder, single episode, unspecified: Secondary | ICD-10-CM

## 2016-11-21 DIAGNOSIS — F32A Depression, unspecified: Secondary | ICD-10-CM

## 2016-12-16 ENCOUNTER — Encounter: Payer: Self-pay | Admitting: Infectious Diseases

## 2016-12-21 ENCOUNTER — Encounter: Payer: Self-pay | Admitting: Family Medicine

## 2016-12-21 ENCOUNTER — Ambulatory Visit (INDEPENDENT_AMBULATORY_CARE_PROVIDER_SITE_OTHER): Payer: 59 | Admitting: Family Medicine

## 2016-12-21 VITALS — BP 134/82 | HR 77 | Temp 98.7°F | Ht 65.0 in | Wt 206.0 lb

## 2016-12-21 DIAGNOSIS — F3289 Other specified depressive episodes: Secondary | ICD-10-CM | POA: Diagnosis not present

## 2016-12-21 DIAGNOSIS — I10 Essential (primary) hypertension: Secondary | ICD-10-CM

## 2016-12-21 DIAGNOSIS — Z1231 Encounter for screening mammogram for malignant neoplasm of breast: Secondary | ICD-10-CM

## 2016-12-21 DIAGNOSIS — Z1239 Encounter for other screening for malignant neoplasm of breast: Secondary | ICD-10-CM

## 2016-12-21 DIAGNOSIS — Z1211 Encounter for screening for malignant neoplasm of colon: Secondary | ICD-10-CM

## 2016-12-21 LAB — POCT URINALYSIS DIP (DEVICE)
BILIRUBIN URINE: NEGATIVE
GLUCOSE, UA: NEGATIVE mg/dL
Hgb urine dipstick: NEGATIVE
Ketones, ur: NEGATIVE mg/dL
Leukocytes, UA: NEGATIVE
NITRITE: NEGATIVE
PH: 5.5 (ref 5.0–8.0)
PROTEIN: NEGATIVE mg/dL
Specific Gravity, Urine: 1.01 (ref 1.005–1.030)
Urobilinogen, UA: 0.2 mg/dL (ref 0.0–1.0)

## 2016-12-21 MED ORDER — BENAZEPRIL HCL 10 MG PO TABS: 10.0000 mg | ORAL_TABLET | Freq: Every day | ORAL | 1 refills | Status: DC

## 2016-12-21 NOTE — Progress Notes (Addendum)
Subjective:    Patient ID: Shannon [name], female    DOB: 1966/01/20, 51 y.o.   MRN: [id number]  HPI Ms. Jeanne [place], a 51 year old female with a history of HIV, hypertension and depression presents for a follow up of chronic conditions.  She is followed by Dr. Bobby Rumpf for HIV, which is stable on current medication regimen. She typically follows with Dr. Johnnye Sima every 6 months. Ms. Platte also has a history of hypertension. She maintains that she has been taking medication consistently.   She is not exercising and is not adherent to low salt diet.  She does not check blood pressures at home.  Patient denies chest pain, fatigue, irregular heart beat, near-syncope, orthopnea, palpitations, syncope and tachypnea.  Cardiovascular risk factors include: obesity (BMI >= 30 kg/m2) and smoking/ tobacco exposure.    Ms. Mccaul also reports a history of depression. She has been on Buspar over the past several months. She continues to miss doses of medications. She She has not been taking medications consistently. She is no longer under the care of psychiatry for this problem.  She continues to  complains  anhedonia and depressed mood.  She says that symptoms have gotten better since she has been focusing on college courses.  She denies current suicidal and homicidal plan or intent.    Past Medical History:  Diagnosis Date  . Arnold-Chiari malformation ([place])   . HIV disease ([place])   . Hypertension   . Multiple thyroid nodules    Fully functioning thyroid with no treatment indicated   Past Surgical History:  Procedure Laterality Date  . ABDOMINAL HYSTERECTOMY    . BRAIN SURGERY    . CHOLECYSTECTOMY      Immunization History  Administered Date(s) Administered  . H1N1 09/24/2008  . Hepatitis B 07/28/2004, 01/21/2005, 12/14/2005  . Hepatitis B, adult 02/22/2014, 04/04/2014, 09/10/2014  . Influenza Split 04/28/2011  . Influenza Whole 04/19/2006, 03/23/2007, 04/26/2008,  03/13/2009, 05/05/2010  . Influenza, Seasonal, Injecte, Preservative Fre 02/21/2014  . Influenza,inj,Quad PF,36+ Mos 04/03/2015  . Pneumococcal Polysaccharide-23 02/04/2004, 03/13/2009   Social History   Social History  . Marital status: Single    Spouse name: N/A  . Number of children: N/A  . Years of education: N/A   Occupational History  . Not on file.   Social History Main Topics  . Smoking status: Current Every Day Smoker    Packs/day: 0.50    Years: 25.00    Types: Cigarettes  . Smokeless tobacco: Never Used  . Alcohol use 0.6 oz/week    1 Standard drinks or equivalent per week     Comment: rare  . Drug use: No  . Sexual activity: Not on file     Comment: declined condoms   Other Topics Concern  . Not on file   Social History Narrative  . No narrative on file   Review of Systems  Constitutional: Negative.  Negative for unexpected weight change.  HENT: Negative.   Eyes: Negative.   Respiratory: Negative.   Cardiovascular: Negative.   Gastrointestinal: Negative.   Endocrine: Negative for polydipsia, polyphagia and polyuria.  Genitourinary: Negative.   Musculoskeletal: Negative.   Skin: Negative.   Allergic/Immunologic: Negative.   Neurological: Negative for dizziness, seizures, facial asymmetry and numbness.  Hematological: Negative.   Psychiatric/Behavioral: Negative for sleep disturbance.       Objective:   Physical Exam  Constitutional: She is oriented to person, place, and time. She appears well-developed and well-nourished.  HENT:  Head: Normocephalic and atraumatic.  Right Ear: External ear normal.  Left Ear: External ear normal.  Mouth/Throat: Oropharynx is clear and moist.  Eyes: Pupils are equal, round, and reactive to light. Conjunctivae and EOM are normal.  Neck: Normal range of motion. Neck supple. Thyromegaly present. No thyroid mass present.  Cardiovascular: Normal rate, regular rhythm, normal heart sounds and intact distal pulses.    Pulmonary/Chest: Effort normal and breath sounds normal.  Musculoskeletal: Normal range of motion.  Neurological: She is alert and oriented to person, place, and time. She has normal reflexes.  Skin: Skin is warm and dry.  Psychiatric: She has a normal mood and affect. Her speech is normal and behavior is normal. Judgment and thought content normal.      BP 134/82 (BP Location: Left Arm, Patient Position: Sitting, Cuff Size: Normal)   Pulse 77   Temp 98.7 F (37.1 C) (Oral)   Ht 5\' 5"  (1.651 m)   Wt 206 lb (93.4 kg)   SpO2 99%   BMI 34.28 kg/m  Assessment & Plan:  1. Essential hypertension Blood pressure is at goal on Benazepril 10 mg daily. No medication adjustments warranted. Reviewed urinalysis, no proteinuria present Will also check renal functioning.  - POCT urinalysis dip (device)  - benazepril (LOTENSIN) 10 MG tablet; Take 1 tablet (10 mg total) by mouth daily.  Dispense: 90 tablet; Refill: 1 - BASIC METABOLIC PANEL WITH GFR  2. Other depression There have been some compliance issues here. I have discussed with her the great importance of following the treatment plan exactly as directed in order to achieve a good medical outcome. I will also send a referral to psychiatry.  Depression screen [facility] 2/9 12/21/2016 09/28/2016 06/29/2016 06/29/2016 11/11/2015  Decreased Interest 2 3 1  0 1  Down, Depressed, Hopeless 2 3 1  0 1  PHQ - 2 Score 4 6 2  0 2  Altered sleeping - 0 1 - 1  Tired, decreased energy - 1 1 - -  Change in appetite - 2 0 - 1  Feeling bad or failure about yourself  - 1 1 - 0  Trouble concentrating - 1 0 - 1  Moving slowly or fidgety/restless - 1 0 - 0  Suicidal thoughts - 0 - - 0  PHQ-9 Score - 12 5 - 5  Difficult doing work/chores - Very difficult - - Not difficult at all  Some recent data might be hidden     Ambulatory referral to Psychiatry  3. Colon cancer screening Will complete forms for cologuard screening.  - Cologuard  4. Breast cancer screening -  MM Digital Screening; Future    RTC: 3 months for hypertension and depression  Greater than 50% of appt spent counseling on chronic conditions [place]  MSN, FNP-C [address], [place] [phone]

## 2016-12-22 LAB — BASIC METABOLIC PANEL WITH GFR
BUN: 11 mg/dL (ref 7–25)
CHLORIDE: 105 mmol/L (ref 98–110)
CO2: 23 mmol/L (ref 20–31)
Calcium: 9 mg/dL (ref 8.6–10.4)
Creat: 1.01 mg/dL (ref 0.50–1.05)
GFR, EST NON AFRICAN AMERICAN: 64 mL/min (ref >=60)
GFR, Est African American: 74 mL/min (ref >=60)
GLUCOSE: 69 mg/dL (ref 65–99)
POTASSIUM: 4.2 mmol/L (ref 3.5–5.3)
Sodium: 140 mmol/L (ref 135–146)

## 2016-12-23 NOTE — Patient Instructions (Addendum)
Blood pressure is at goal on current medication regimen. Continue to follow a low fat, low sodium diet.   Depression: I have sent a referral to psychiatry. Please call office if you have not received a call to schedule an appt in 1 week.  Will continue Buspar 5 gm twice daily  Living With Depression Everyone experiences occasional disappointment, sadness, and loss in their lives. When you are feeling down, blue, or sad for at least 2 weeks in a row, it may mean that you have depression. Depression can affect your thoughts and feelings, relationships, daily activities, and physical health. It is caused by changes in the way your brain functions. If you receive a diagnosis of depression, your health care provider will tell you which type of depression you have and what treatment options are available to you. If you are living with depression, there are ways to help you recover from it and also ways to prevent it from coming back. How to cope with lifestyle changes Coping with stress Stress is your body's reaction to life changes and events, both good and bad. Stressful situations may include:  Getting married.  The death of a spouse.  Losing a job.  Retiring.  Having a baby.  Stress can last just a few hours or it can be ongoing. Stress can play a major role in depression, so it is important to learn both how to cope with stress and how to think about it differently. Talk with your health care provider or a counselor if you would like to learn more about stress reduction. He or she may suggest some stress reduction techniques, such as:  Music therapy. This can include creating music or listening to music. Choose music that you enjoy and that inspires you.  Mindfulness-based meditation. This kind of meditation can be done while sitting or walking. It involves being aware of your normal breaths, rather than trying to control your breathing.  Centering prayer. This is a kind of meditation that  involves focusing on a spiritual word or phrase. Choose a word, phrase, or sacred image that is meaningful to you and that brings you peace.  Deep breathing. To do this, expand your stomach and inhale slowly through your nose. Hold your breath for 3-5 seconds, then exhale slowly, allowing your stomach muscles to relax.  Muscle relaxation. This involves intentionally tensing muscles then relaxing them.  Choose a stress reduction technique that fits your lifestyle and personality. Stress reduction techniques take time and practice to develop. Set aside 5-15 minutes a day to do them. Therapists can offer training in these techniques. The training may be covered by some insurance plans. Other things you can do to manage stress include:  Keeping a stress diary. This can help you learn what triggers your stress and ways to control your response.  Understanding what your limits are and saying no to requests or events that lead to a schedule that is too full.  Thinking about how you respond to certain situations. You may not be able to control everything, but you can control how you react.  Adding humor to your life by watching funny films or TV shows.  Making time for activities that help you relax and not feeling guilty about spending your time this way.  Medicines Your health care provider may suggest certain medicines if he or she feels that they will help improve your condition. Avoid using alcohol and other substances that may prevent your medicines from working properly (may interact).  It is also important to:  Talk with your pharmacist or health care provider about all the medicines that you take, their possible side effects, and what medicines are safe to take together.  Make it your goal to take part in all treatment decisions (shared decision-making). This includes giving input on the side effects of medicines. It is best if shared decision-making with your health care provider is part of  your total treatment plan.  If your health care provider prescribes a medicine, you may not notice the full benefits of it for 4-8 weeks. Most people who are treated for depression need to be on medicine for at least 6-12 months after they feel better. If you are taking medicines as part of your treatment, do not stop taking medicines without first talking to your health care provider. You may need to have the medicine slowly decreased (tapered) over time to decrease the risk of harmful side effects. Relationships Your health care provider may suggest family therapy along with individual therapy and drug therapy. While there may not be family problems that are causing you to feel depressed, it is still important to make sure your family learns as much as they can about your mental health. Having your family's support can help make your treatment successful. How to recognize changes in your condition Everyone has a different response to treatment for depression. Recovery from major depression happens when you have not had signs of major depression for two months. This may mean that you will start to:  Have more interest in doing activities.  Feel less hopeless than you did 2 months ago.  Have more energy.  Overeat less often, or have better or improving appetite.  Have better concentration.  Your health care provider will work with you to decide the next steps in your recovery. It is also important to recognize when your condition is getting worse. Watch for these signs:  Having fatigue or low energy.  Eating too much or too little.  Sleeping too much or too little.  Feeling restless, agitated, or hopeless.  Having trouble concentrating or making decisions.  Having unexplained physical complaints.  Feeling irritable, angry, or aggressive.  Get help as soon as you or your family members notice these symptoms coming back. How to get support and help from others How to talk with  friends and family members about your condition Talking to friends and family members about your condition can provide you with one way to get support and guidance. Reach out to trusted friends or family members, explain your symptoms to them, and let them know that you are working with a health care provider to treat your depression. Financial resources Not all insurance plans cover mental health care, so it is important to check with your insurance carrier. If paying for co-pays or counseling services is a problem, search for a local or county mental health care center. They may be able to offer public mental health care services at low or no cost when you are not able to see a private health care provider. If you are taking medicine for depression, you may be able to get the generic form, which may be less expensive. Some makers of prescription medicines also offer help to patients who cannot afford the medicines they need. Follow these instructions at home:  Get the right amount and quality of sleep.  Cut down on using caffeine, tobacco, alcohol, and other potentially harmful substances.  Try to exercise, such as walking or  lifting small weights.  Take over-the-counter and prescription medicines only as told by your health care provider.  Eat a healthy diet that includes plenty of vegetables, fruits, whole grains, low-fat dairy products, and lean protein. Do not eat a lot of foods that are high in solid fats, added sugars, or salt.  Keep all follow-up visits as told by your health care provider. This is important. Contact a health care provider if:  You stop taking your antidepressant medicines, and you have any of these symptoms: ? Nausea. ? Headache. ? Feeling lightheaded. ? Chills and body aches. ? Not being able to sleep (insomnia).  You or your friends and family think your depression is getting worse. Get help right away if:  You have thoughts of hurting yourself or  others. If you ever feel like you may hurt yourself or others, or have thoughts about taking your own life, get help right away. You can go to your nearest emergency department or call:  Your local emergency services (911 in the [place]  A suicide crisis helpline, such as the [facility] at 607-044-7240. This is open 24-hours a day.  Summary  If you are living with depression, there are ways to help you recover from it and also ways to prevent it from coming back.  Work with your health care team to create a management plan that includes counseling, stress management techniques, and healthy lifestyle habits. This information is not intended to replace advice given to you by your health care provider. Make sure you discuss any questions you have with your health care provider. Document Released: 04/27/2016 Document Revised: 04/27/2016 Document Reviewed: 04/27/2016 Elsevier Interactive Patient Education  2018 [place] Eating Plan DASH stands for "Dietary Approaches to Stop Hypertension." The DASH eating plan is a healthy eating plan that has been shown to reduce high blood pressure (hypertension). It may also reduce your risk for type 2 diabetes, heart disease, and stroke. The DASH eating plan may also help with weight loss. What are tips for following this plan? General guidelines  Avoid eating more than 2,300 mg (milligrams) of salt (sodium) a day. If you have hypertension, you may need to reduce your sodium intake to 1,500 mg a day.  Limit alcohol intake to no more than 1 drink a day for nonpregnant women and 2 drinks a day for men. One drink equals 12 oz of beer, 5 oz of wine, or 1 oz of hard liquor.  Work with your health care provider to maintain a healthy body weight or to lose weight. Ask what an ideal weight is for you.  Get at least 30 minutes of exercise that causes your heart to beat faster (aerobic exercise) most days of the week.  Activities may include walking, swimming, or biking.  Work with your health care provider or diet and nutrition specialist (dietitian) to adjust your eating plan to your individual calorie needs. Reading food labels  Check food labels for the amount of sodium per serving. Choose foods with less than 5 percent of the Daily Value of sodium. Generally, foods with less than 300 mg of sodium per serving fit into this eating plan.  To find whole grains, look for the word "whole" as the first word in the ingredient list. Shopping  Buy products labeled as "low-sodium" or "no salt added."  Buy fresh foods. Avoid canned foods and premade or frozen meals. Cooking  Avoid adding salt when cooking. Use salt-free seasonings or herbs instead  of table salt or sea salt. Check with your health care provider or pharmacist before using salt substitutes.  Do not fry foods. Cook foods using healthy methods such as baking, boiling, grilling, and broiling instead.  Cook with heart-healthy oils, such as olive, canola, soybean, or sunflower oil. Meal planning   Eat a balanced diet that includes: ? 5 or more servings of fruits and vegetables each day. At each meal, try to fill half of your plate with fruits and vegetables. ? Up to 6-8 servings of whole grains each day. ? Less than 6 oz of lean meat, poultry, or fish each day. A 3-oz serving of meat is about the same size as a deck of cards. One egg equals 1 oz. ? 2 servings of low-fat dairy each day. ? A serving of nuts, seeds, or beans 5 times each week. ? Heart-healthy fats. Healthy fats called Omega-3 fatty acids are found in foods such as flaxseeds and coldwater fish, like sardines, salmon, and mackerel.  Limit how much you eat of the following: ? Canned or prepackaged foods. ? Food that is high in trans fat, such as fried foods. ? Food that is high in saturated fat, such as fatty meat. ? Sweets, desserts, sugary drinks, and other foods with added  sugar. ? Full-fat dairy products.  Do not salt foods before eating.  Try to eat at least 2 vegetarian meals each week.  Eat more home-cooked food and less restaurant, buffet, and fast food.  When eating at a restaurant, ask that your food be prepared with less salt or no salt, if possible. What foods are recommended? The items listed may not be a complete list. Talk with your dietitian about what dietary choices are best for you. Grains Whole-grain or whole-wheat bread. Whole-grain or whole-wheat pasta. Brown rice. [place]. Bulgur. Whole-grain and low-sodium cereals. Pita bread. Low-fat, low-sodium crackers. Whole-wheat flour tortillas. Vegetables Fresh or frozen vegetables (raw, steamed, roasted, or grilled). Low-sodium or reduced-sodium tomato and vegetable juice. Low-sodium or reduced-sodium tomato sauce and tomato paste. Low-sodium or reduced-sodium canned vegetables. Fruits All fresh, dried, or frozen fruit. Canned fruit in natural juice (without added sugar). Meat and other protein foods Skinless chicken or [place]. Ground chicken or [place]. Pork with fat trimmed off. Fish and seafood. Egg whites. Dried beans, peas, or lentils. Unsalted nuts, nut butters, and seeds. Unsalted canned beans. Lean cuts of beef with fat trimmed off. Low-sodium, lean deli meat. Dairy Low-fat (1%) or fat-free (skim) milk. Fat-free, low-fat, or reduced-fat cheeses. Nonfat, low-sodium ricotta or cottage cheese. Low-fat or nonfat yogurt. Low-fat, low-sodium cheese. Fats and oils Soft margarine without trans fats. Vegetable oil. Low-fat, reduced-fat, or light mayonnaise and salad dressings (reduced-sodium). Canola, safflower, olive, soybean, and sunflower oils. Avocado. Seasoning and other foods Herbs. Spices. Seasoning mixes without salt. Unsalted popcorn and pretzels. Fat-free sweets. What foods are not recommended? The items listed may not be a complete list. Talk with your dietitian about what  dietary choices are best for you. Grains Baked goods made with fat, such as croissants, muffins, or some breads. Dry pasta or rice meal packs. Vegetables Creamed or fried vegetables. Vegetables in a cheese sauce. Regular canned vegetables (not low-sodium or reduced-sodium). Regular canned tomato sauce and paste (not low-sodium or reduced-sodium). Regular tomato and vegetable juice (not low-sodium or reduced-sodium). Angie Fava. Olives. Fruits Canned fruit in a light or heavy syrup. Fried fruit. Fruit in cream or butter sauce. Meat and other protein foods Fatty cuts of meat. Ribs. Mace[place] Pro  meat. Berniece Salines. Sausage. Bologna and other processed lunch meats. Salami. Fatback. Hotdogs. Bratwurst. Salted nuts and seeds. Canned beans with added salt. Canned or smoked fish. Whole eggs or egg yolks. Chicken or [place] with skin. Dairy Whole or 2% milk, cream, and half-and-half. Whole or full-fat cream cheese. Whole-fat or sweetened yogurt. Full-fat cheese. Nondairy creamers. Whipped toppings. Processed cheese and cheese spreads. Fats and oils Butter. Stick margarine. Lard. Shortening. Ghee. Bacon fat. Tropical oils, such as coconut, palm kernel, or palm oil. Seasoning and other foods Salted popcorn and pretzels. Onion salt, garlic salt, seasoned salt, table salt, and sea salt. Worcestershire sauce. Tartar sauce. Barbecue sauce. Teriyaki sauce. Soy sauce, including reduced-sodium. Steak sauce. Canned and packaged gravies. Fish sauce. Oyster sauce. Cocktail sauce. Horseradish that you find on the shelf. Ketchup. Mustard. Meat flavorings and tenderizers. Bouillon cubes. Hot sauce and Tabasco sauce. Premade or packaged marinades. Premade or packaged taco seasonings. Relishes. Regular salad dressings. Where to find more information:  National Heart, Lung, and [place]: https://wilson-eaton.com/  American Heart Association: www.heart.org Summary  The DASH eating plan is a healthy eating plan that has been shown to reduce  high blood pressure (hypertension). It may also reduce your risk for type 2 diabetes, heart disease, and stroke.  With the DASH eating plan, you should limit salt (sodium) intake to 2,300 mg a day. If you have hypertension, you may need to reduce your sodium intake to 1,500 mg a day.  When on the DASH eating plan, aim to eat more fresh fruits and vegetables, whole grains, lean proteins, low-fat dairy, and heart-healthy fats.  Work with your health care provider or diet and nutrition specialist (dietitian) to adjust your eating plan to your individual calorie needs. This information is not intended to replace advice given to you by your health care provider. Make sure you discuss any questions you have with your health care provider. Document Released: 05/14/2011 Document Revised: 05/18/2016 Document Reviewed: 05/18/2016 Elsevier Interactive Patient Education  2017 [employer].

## 2016-12-28 ENCOUNTER — Ambulatory Visit: Payer: 59 | Admitting: Family Medicine

## 2017-01-07 ENCOUNTER — Other Ambulatory Visit: Payer: Self-pay | Admitting: Family Medicine

## 2017-01-07 DIAGNOSIS — Z1231 Encounter for screening mammogram for malignant neoplasm of breast: Secondary | ICD-10-CM

## 2017-02-09 ENCOUNTER — Encounter: Payer: Self-pay | Admitting: Licensed Clinical Social Worker

## 2017-02-10 ENCOUNTER — Encounter: Payer: 59 | Admitting: *Deleted

## 2017-02-10 ENCOUNTER — Encounter: Payer: Self-pay | Admitting: Licensed Clinical Social Worker

## 2017-02-11 ENCOUNTER — Encounter (INDEPENDENT_AMBULATORY_CARE_PROVIDER_SITE_OTHER): Payer: Self-pay | Admitting: *Deleted

## 2017-02-11 VITALS — BP 111/78 | HR 76 | Temp 98.2°F | Wt 207.2 lb

## 2017-02-11 DIAGNOSIS — Z006 Encounter for examination for normal comparison and control in clinical research program: Secondary | ICD-10-CM

## 2017-02-11 NOTE — Progress Notes (Signed)
Shannon [name] here for month 40 Reprieve visit, [id number] A Randomized Trial to Prevent Vascular Events in HIV. States much better compliance with her study medication. Denied any muscle ache or weakness. Next study visit scheduled for 06/15/17 at 4:00pm.

## 2017-02-17 IMAGING — CT CT ABD-PELV W/O CM
2 of 4 series · 13 of 36 positions shown, 19 images · non-contrast
Comparison: None.

CLINICAL DATA: Left upper quadrant pain radiating to left flank x3
days, nausea. Prior cholecystectomy and hysterectomy.

EXAM:
CT ABDOMEN AND PELVIS WITHOUT CONTRAST
TECHNIQUE: Multidetector CT imaging of the abdomen and pelvis was performed
following the standard protocol without IV contrast.

[Series 601: coronal body · coronal · 0.88mm/px · 1 of 130 slices shown, 2 images]
[im 44/130  soft-tissue]
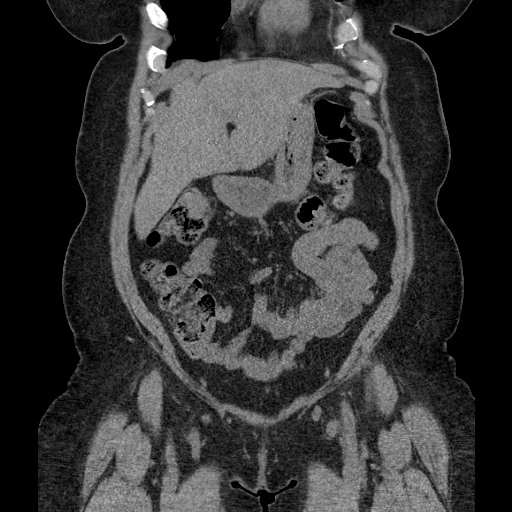
[im 44/130  bone]
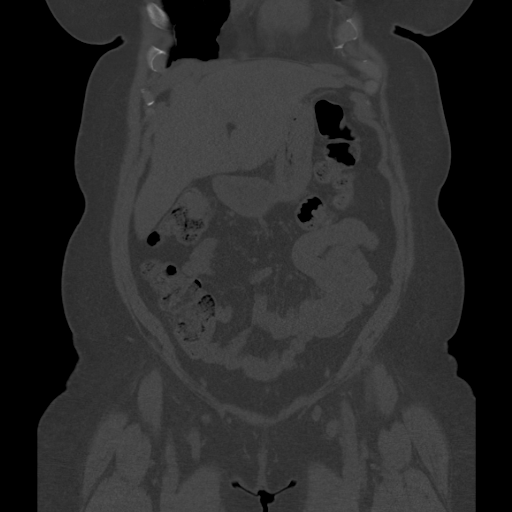

[Series 602: sagittal body · sagittal · 0.88mm/px · 12 of 161 slices shown, 17 images]
[im 9/161  soft-tissue]
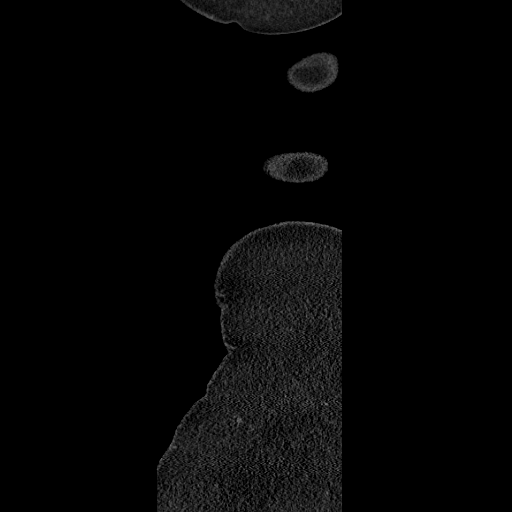
[im 9/161  lung]
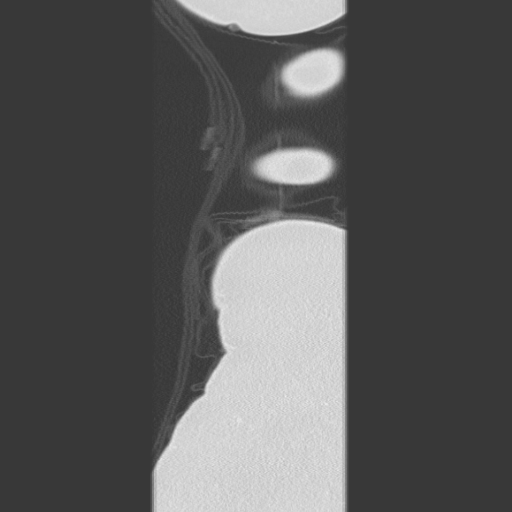
[im 9/161  bone]
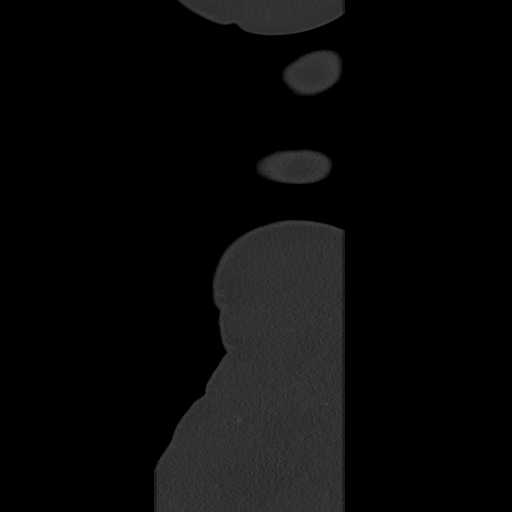
[im 18/161  lung]
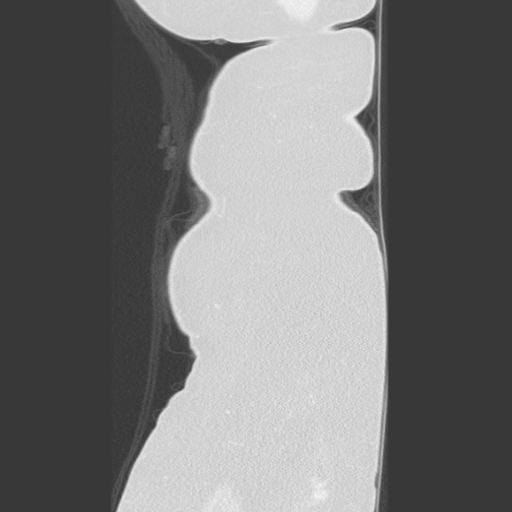
[im 27/161  soft-tissue]
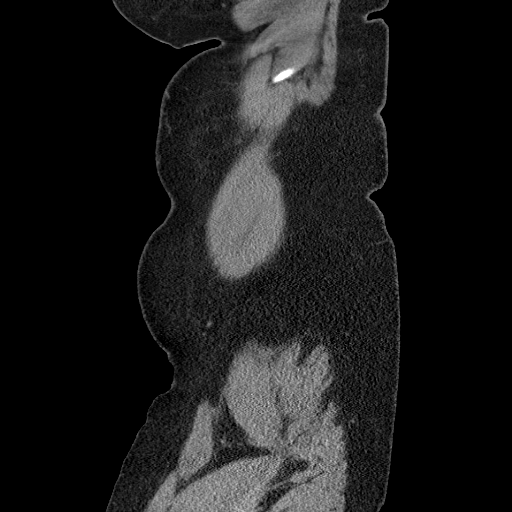
[im 27/161  lung]
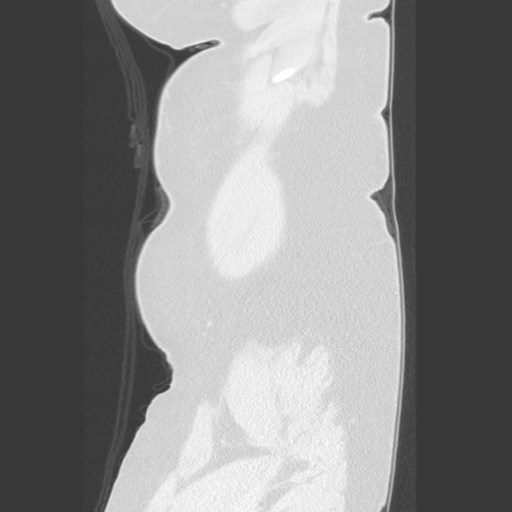
[im 36/161  soft-tissue]
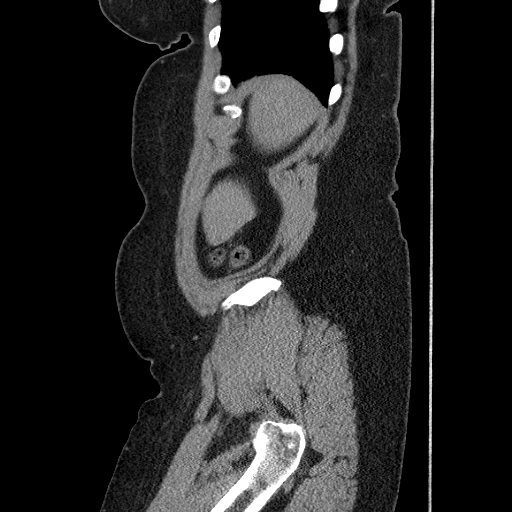
[im 36/161  lung]
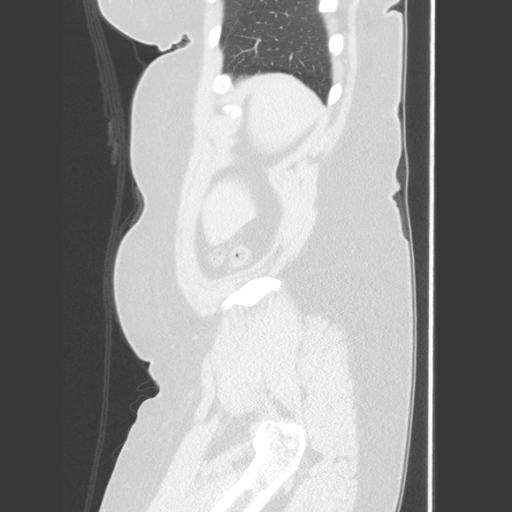
[im 54/161  soft-tissue]
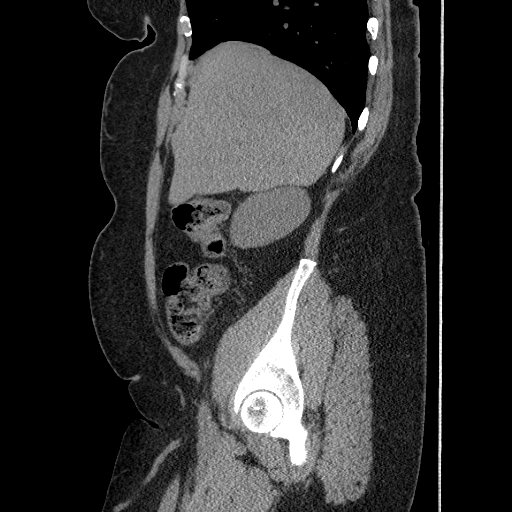
[im 63/161  soft-tissue]
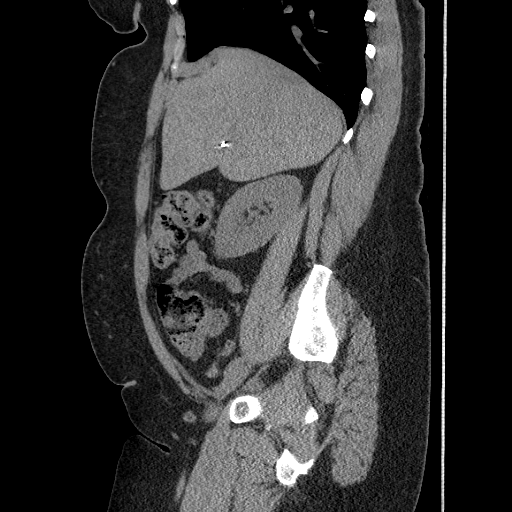
[im 81/161  soft-tissue]
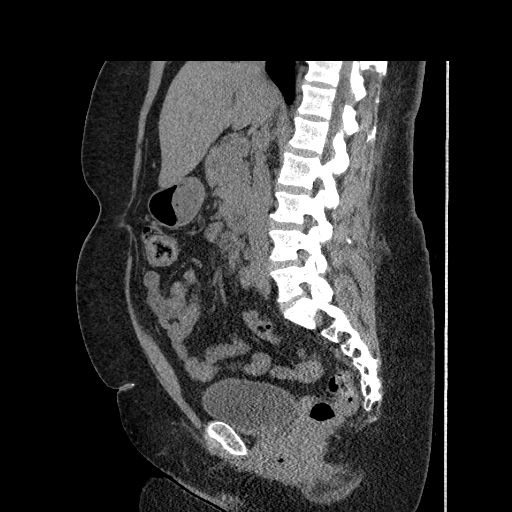
[im 98/161  soft-tissue]
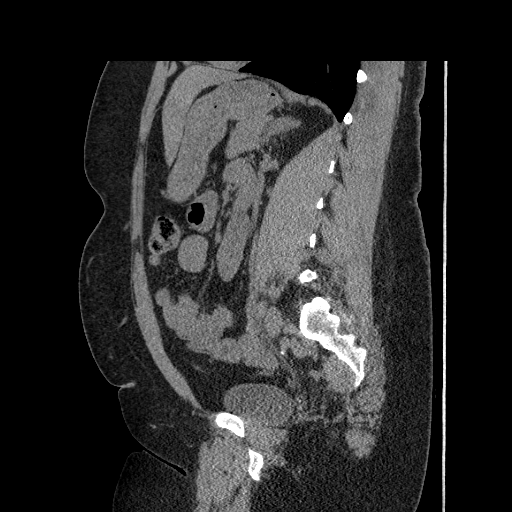
[im 107/161  soft-tissue]
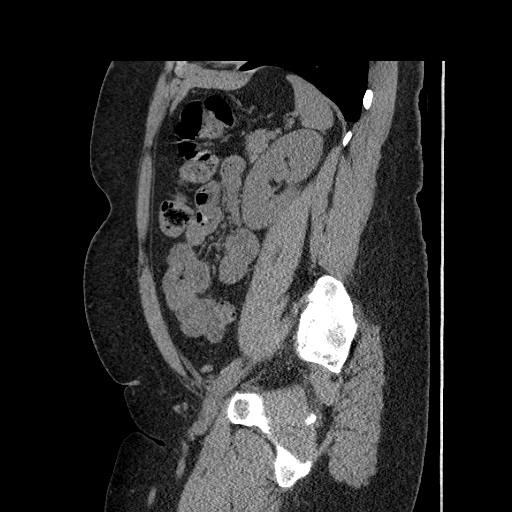
[im 125/161  soft-tissue]
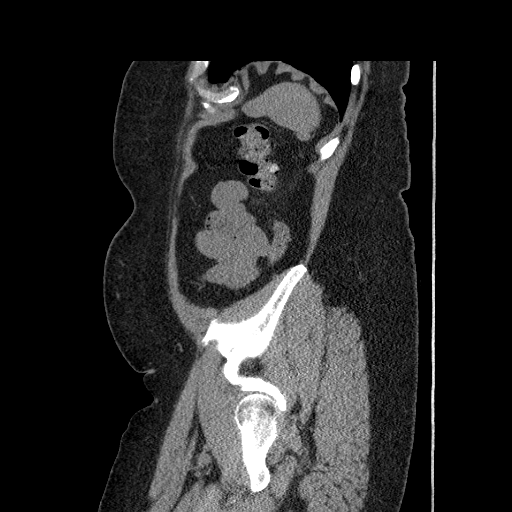
[im 125/161  bone]
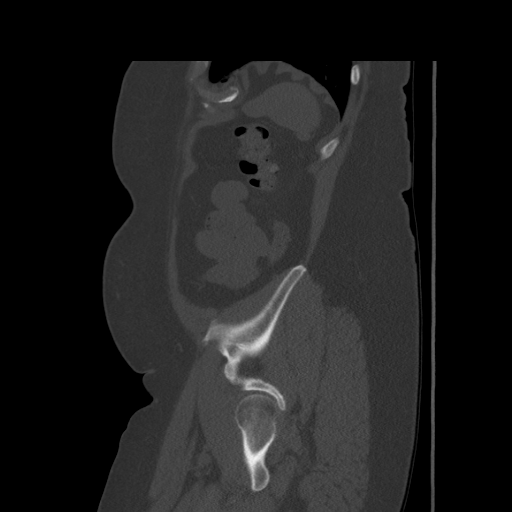
[im 134/161  soft-tissue]
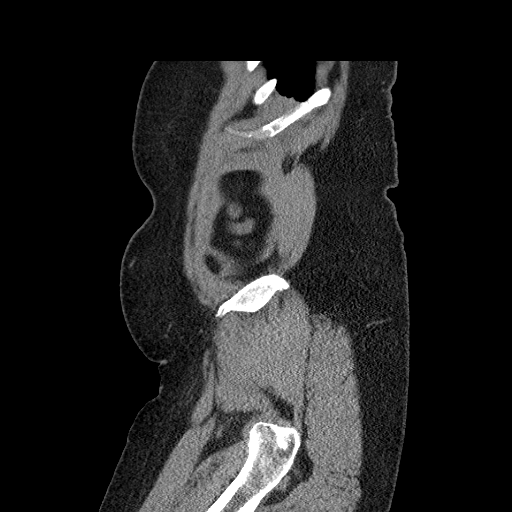
[im 152/161  soft-tissue]
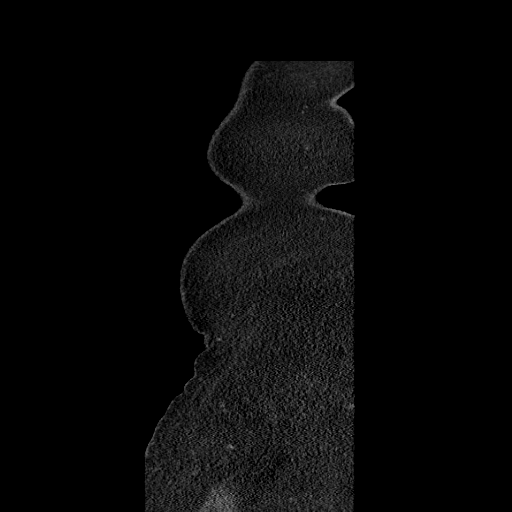

[13 of 36 positions shown; findings below may reference images not displayed]

FINDINGS: Lower chest:  Lung bases are clear.

Hepatobiliary: Unenhanced liver is unremarkable.

Status post cholecystectomy. No intrahepatic or extrahepatic ductal
dilatation.

Pancreas: Focal fat along the pancreatic tail (series 3/ image 24).
Otherwise within normal limits.

Spleen: Within normal limits.

Adrenals/Urinary Tract: Adrenal glands are within normal limits.

2.4 cm cyst in the anterior right lower kidney (series 3/ image 45).
No renal calculi or hydronephrosis.

8 mm calculus in the posterior left lower kidney (series 3/image
34), nonobstructing). No hydronephrosis.

No ureteral or bladder calculi.

Bladder is within normal limits.

Stomach/Bowel: Stomach is within normal limits.

No evidence of bowel obstruction.

Normal appendix (series 3/image 63).

Vascular/Lymphatic: No evidence of abdominal aortic aneurysm.
Atherosclerotic calcifications of the abdominal aorta and branch
vessels.

No suspicious abdominopelvic lymphadenopathy.

Reproductive: Status post hysterectomy.

No adnexal masses.

Other: No abdominopelvic ascites.

Musculoskeletal: Mild degenerative changes of the visualized
thoracolumbar spine.
IMPRESSION: 8 mm nonobstructing left lower pole renal calculus. No ureteral or
bladder calculi. No hydronephrosis.

No evidence of bowel obstruction.  Normal appendix.

Prior cholecystectomy and hysterectomy.

## 2017-02-24 ENCOUNTER — Ambulatory Visit (INDEPENDENT_AMBULATORY_CARE_PROVIDER_SITE_OTHER): Payer: 59 | Admitting: Psychiatry

## 2017-02-24 ENCOUNTER — Telehealth (HOSPITAL_COMMUNITY): Payer: Self-pay | Admitting: Psychiatry

## 2017-02-24 ENCOUNTER — Encounter (HOSPITAL_COMMUNITY): Payer: Self-pay | Admitting: Psychiatry

## 2017-02-24 VITALS — BP 120/76 | HR 76 | Ht 63.0 in | Wt 204.2 lb

## 2017-02-24 DIAGNOSIS — B20 Human immunodeficiency virus [HIV] disease: Secondary | ICD-10-CM

## 2017-02-24 DIAGNOSIS — Z91410 Personal history of adult physical and sexual abuse: Secondary | ICD-10-CM

## 2017-02-24 DIAGNOSIS — Z566 Other physical and mental strain related to work: Secondary | ICD-10-CM | POA: Diagnosis not present

## 2017-02-24 DIAGNOSIS — F419 Anxiety disorder, unspecified: Secondary | ICD-10-CM

## 2017-02-24 DIAGNOSIS — F4310 Post-traumatic stress disorder, unspecified: Secondary | ICD-10-CM | POA: Diagnosis not present

## 2017-02-24 DIAGNOSIS — F332 Major depressive disorder, recurrent severe without psychotic features: Secondary | ICD-10-CM | POA: Diagnosis not present

## 2017-02-24 DIAGNOSIS — R454 Irritability and anger: Secondary | ICD-10-CM | POA: Diagnosis not present

## 2017-02-24 DIAGNOSIS — F17210 Nicotine dependence, cigarettes, uncomplicated: Secondary | ICD-10-CM | POA: Diagnosis not present

## 2017-02-24 MED ORDER — FLUOXETINE HCL 10 MG PO CAPS: ORAL_CAPSULE | ORAL | 0 refills | Status: DC

## 2017-02-24 NOTE — Telephone Encounter (Signed)
02/24/17 11:48am Dr. Adele Schilder requested that pt get an appt with theraptist - pt stated that at this time she can't afford the co-pay.Marland KitchenMariana [name]
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
## 2017-03-09 ENCOUNTER — Ambulatory Visit
Admission: RE | Admit: 2017-03-09 | Discharge: 2017-03-09 | Disposition: A | Discharge status: Home / Self Care | Payer: 59 | Source: Ambulatory Visit | Attending: Family Medicine | Admitting: Family Medicine

## 2017-03-09 DIAGNOSIS — Z1231 Encounter for screening mammogram for malignant neoplasm of breast: Secondary | ICD-10-CM

## 2017-03-24 ENCOUNTER — Ambulatory Visit (INDEPENDENT_AMBULATORY_CARE_PROVIDER_SITE_OTHER): Payer: 59 | Admitting: Family Medicine

## 2017-03-24 ENCOUNTER — Encounter: Payer: Self-pay | Admitting: Family Medicine

## 2017-03-24 VITALS — BP 132/82 | HR 83 | Temp 98.5°F | Resp 16 | Ht 65.0 in | Wt 205.0 lb

## 2017-03-24 DIAGNOSIS — I10 Essential (primary) hypertension: Secondary | ICD-10-CM

## 2017-03-24 DIAGNOSIS — B20 Human immunodeficiency virus [HIV] disease: Secondary | ICD-10-CM

## 2017-03-24 DIAGNOSIS — L600 Ingrowing nail: Secondary | ICD-10-CM | POA: Diagnosis not present

## 2017-03-24 DIAGNOSIS — E6609 Other obesity due to excess calories: Secondary | ICD-10-CM

## 2017-03-24 LAB — POCT URINALYSIS DIP (DEVICE)
BILIRUBIN URINE: NEGATIVE
Glucose, UA: NEGATIVE mg/dL
HGB URINE DIPSTICK: NEGATIVE
KETONES UR: NEGATIVE mg/dL
Leukocytes, UA: NEGATIVE
Nitrite: NEGATIVE
PH: 6 (ref 5.0–8.0)
PROTEIN: NEGATIVE mg/dL
SPECIFIC GRAVITY, URINE: 1.015 (ref 1.005–1.030)
Urobilinogen, UA: 0.2 mg/dL (ref 0.0–1.0)

## 2017-03-24 NOTE — Patient Instructions (Addendum)
I have sent a referral to podiatry for ingrown toenail to right great toenail.  Continue medication, monitor blood pressure at home. Continue DASH diet. Reminder to go to the ER if any CP, SOB, nausea, dizziness, severe HA, changes vision/speech, left arm numbness and tingling and jaw pain.    Recommend a lowfat, low carbohydrate diet divided over 5-6 small meals, increase water intake to 6-8 glasses, and 150 minutes per week of cardiovascular exercise.     Ingrown Toenail An ingrown toenail occurs when the corner or sides of your toenail grow into the surrounding skin. The big toe is most commonly affected, but it can happen to any of your toes. If your ingrown toenail is not treated, you will be at risk for infection. What are the causes? This condition may be caused by:  Wearing shoes that are too small or tight.  Injury or trauma, such as stubbing your toe or having your toe stepped on.  Improper cutting or care of your toenails.  Being born with (congenital) nail or foot abnormalities, such as having a nail that is too big for your toe.  What increases the risk? Risk factors for an ingrown toenail include:  Age. Your nails tend to thicken as you get older, so ingrown nails are more common in older people.  Diabetes.  Cutting your toenails incorrectly.  Blood circulation problems.  What are the signs or symptoms? Symptoms may include:  Pain, soreness, or tenderness.  Redness.  Swelling.  Hardening of the skin surrounding the toe.  Your ingrown toenail may be infected if there is fluid, pus, or drainage. How is this diagnosed? An ingrown toenail may be diagnosed by medical history and physical exam. If your toenail is infected, your health care provider may test a sample of the drainage. How is this treated? Treatment depends on the severity of your ingrown toenail. Some ingrown toenails may be treated at home. More severe or infected ingrown toenails may require  surgery to remove all or part of the nail. Infected ingrown toenails may also be treated with antibiotic medicines. Follow these instructions at home:  If you were prescribed an antibiotic medicine, finish all of it even if you start to feel better.  Soak your foot in warm soapy water for 20 minutes, 3 times per day or as directed by your health care provider.  Carefully lift the edge of the nail away from the sore skin by wedging a small piece of cotton under the corner of the nail. This may help with the pain. Be careful not to cause more injury to the area.  Wear shoes that fit well. If your ingrown toenail is causing you pain, try wearing sandals, if possible.  Trim your toenails regularly and carefully. Do not cut them in a curved shape. Cut your toenails straight across. This prevents injury to the skin at the corners of the toenail.  Keep your feet clean and dry.  If you are having trouble walking and are given crutches by your health care provider, use them as directed.  Do not pick at your toenail or try to remove it yourself.  Take medicines only as directed by your health care provider.  Keep all follow-up visits as directed by your health care provider. This is important. Contact a health care provider if:  Your symptoms do not improve with treatment. Get help right away if:  You have red streaks that start at your foot and go up your leg.  You  have a fever.  You have increased redness, swelling, or pain.  You have fluid, blood, or pus coming from your toenail. This information is not intended to replace advice given to you by your health care provider. Make sure you discuss any questions you have with your health care provider. Document Released: 05/22/2000 Document Revised: 10/25/2015 Document Reviewed: 04/18/2014 Elsevier Interactive Patient Education  [employer].

## 2017-03-24 NOTE — Progress Notes (Signed)
Subjective:    Patient ID: Shannon [name], female    DOB: 07-16-1965, 51 y.o.   MRN: [id number]  HPI Shannon [name], a very pleasant 51 year old female with a history of HIV and hypertension presents for follow up.  She continues to follow with  Dr. Bobby Rumpf for HIV, which is stable on current medication regimen. She typically follows with Dr. Johnnye Sima every 6 months. Shannon [name] also has a history of hypertension. She maintains that she has been taking medication consistently.   She is not exercising and is not adherent to low salt diet. She is completing her last semester in college and says that she has not had time to exercise routinely.  She does not check blood pressures at home.  Patient denies chest pain, fatigue, irregular heart beat, near-syncope, orthopnea, palpitations, syncope and tachypnea.  Cardiovascular risk factors include: obesity (BMI >= 30 kg/m2) and smoking/ tobacco exposure.   Shannon [name] is complaining of pain to right great toenail for greater than 1 month. She says that pain is worsened by wearing shoes. She describes pain as intermittent and sharp. Current pain intensity is 2-3/10. She occasionally uses OTC Ibuprofen to alleviate pain. She has not attempted any other OTC interventions    Past Medical History:  Diagnosis Date  . Arnold-Chiari malformation ([place])   . HIV disease ([place])   . Hypertension   . Multiple thyroid nodules    Fully functioning thyroid with no treatment indicated   Past Surgical History:  Procedure Laterality Date  . ABDOMINAL HYSTERECTOMY    . BRAIN SURGERY    . CHOLECYSTECTOMY      Immunization History  Administered Date(s) Administered  . H1N1 09/24/2008  . Hepatitis B 07/28/2004, 01/21/2005, 12/14/2005  . Hepatitis B, adult 02/22/2014, 04/04/2014, 09/10/2014  . Influenza Split 04/28/2011  . Influenza Whole 04/19/2006, 03/23/2007, 04/26/2008, 03/13/2009, 05/05/2010  . Influenza, Seasonal, Injecte, Preservative Fre  02/21/2014  . Influenza,inj,Quad PF,6+ Mos 04/03/2015  . Pneumococcal Polysaccharide-23 02/04/2004, 03/13/2009   Social History   Social History  . Marital status: Single    Spouse name: N/A  . Number of children: N/A  . Years of education: N/A   Occupational History  . Not on file.   Social History Main Topics  . Smoking status: Current Every Day Smoker    Packs/day: 0.50    Years: 25.00    Types: Cigarettes  . Smokeless tobacco: Never Used  . Alcohol use 0.6 oz/week    1 Standard drinks or equivalent per week     Comment: rare  . Drug use: No  . Sexual activity: Not on file     Comment: declined condoms   Other Topics Concern  . Not on file   Social History Narrative  . No narrative on file   Review of Systems  Constitutional: Negative.  Negative for unexpected weight change.  HENT: Negative.   Eyes: Negative.   Respiratory: Negative.   Cardiovascular: Negative.   Gastrointestinal: Negative.   Endocrine: Negative for polydipsia, polyphagia and polyuria.  Genitourinary: Negative.   Musculoskeletal: Negative.   Skin: Negative.   Allergic/Immunologic: Negative.   Neurological: Negative for dizziness, seizures, facial asymmetry and numbness.  Hematological: Negative.   Psychiatric/Behavioral: Positive for depression. Negative for sleep disturbance.       Objective:   Physical Exam  Constitutional: She is oriented to person, place, and time. She appears well-developed and well-nourished.  HENT:  Head: Normocephalic and atraumatic.  Right Ear: External ear normal.  Left Ear: External ear normal.  Mouth/Throat: Oropharynx is clear and moist.  Eyes: Pupils are equal, round, and reactive to light. Conjunctivae and EOM are normal.  Neck: Normal range of motion. Neck supple. Thyromegaly present. No thyroid mass present.  Cardiovascular: Normal rate, regular rhythm, normal heart sounds and intact distal pulses.   Pulmonary/Chest: Effort normal and breath sounds  normal.  Musculoskeletal: Normal range of motion.  Neurological: She is alert and oriented to person, place, and time. She has normal reflexes.  Skin: Skin is warm and dry.  Right great toenail ingrown, tender to palpation, mild yellowing to toenails.   Psychiatric: She has a normal mood and affect. Her speech is normal and behavior is normal. Judgment and thought content normal.      BP 132/82 (BP Location: Left Arm, Patient Position: Sitting, Cuff Size: Normal)   Pulse 83   Temp 98.5 F (36.9 C) (Oral)   Resp 16   Ht 5\' 5"  (1.651 m)   Wt 205 lb (93 kg)   SpO2 99%   BMI 34.11 kg/m  Assessment & Plan:  1. Essential hypertension Blood pressure is at goal on Benazepril 10 mg daily. No medication adjustments warranted. Reviewed urinalysis, no proteinuria present - Continue medication, monitor blood pressure at home. Continue DASH diet. Reminder to go to the ER if any CP, SOB, nausea, dizziness, severe HA, changes vision/speech, left arm numbness and tingling and jaw pain.   2. Human immunodeficiency virus (HIV) disease ([place]) Continue to follow up with Dr. Johnnye Sima as scheduled.   3. Ingrown right big toenail Will send a referral to podiatry for further work up and evaluation.  - Ambulatory referral to Podiatry  4. Class 1 obesity due to excess calories without serious comorbidity in adult, unspecified BMI Body mass index is 34.11 kg/m. Recommend a lowfat, low carbohydrate diet divided over 5-6 small meals, increase water intake to 6-8 glasses, and 150 minutes per week of cardiovascular exercise.  Goal is 7 pound weight loss in 6 months  RTC: 3 months hypertension   Donia Pounds  MSN, FNP-C [facility] [address] [place], [place] [address] [phone]

## 2017-03-25 ENCOUNTER — Ambulatory Visit (INDEPENDENT_AMBULATORY_CARE_PROVIDER_SITE_OTHER): Payer: 59 | Admitting: Psychiatry

## 2017-03-25 ENCOUNTER — Encounter (HOSPITAL_COMMUNITY): Payer: Self-pay | Admitting: Psychiatry

## 2017-03-25 DIAGNOSIS — F332 Major depressive disorder, recurrent severe without psychotic features: Secondary | ICD-10-CM | POA: Diagnosis not present

## 2017-03-25 DIAGNOSIS — F419 Anxiety disorder, unspecified: Secondary | ICD-10-CM | POA: Diagnosis not present

## 2017-03-25 DIAGNOSIS — R454 Irritability and anger: Secondary | ICD-10-CM | POA: Diagnosis not present

## 2017-03-25 DIAGNOSIS — B20 Human immunodeficiency virus [HIV] disease: Secondary | ICD-10-CM

## 2017-03-25 DIAGNOSIS — F4310 Post-traumatic stress disorder, unspecified: Secondary | ICD-10-CM

## 2017-03-25 DIAGNOSIS — R450 Nervousness: Secondary | ICD-10-CM | POA: Diagnosis not present

## 2017-03-25 DIAGNOSIS — R451 Restlessness and agitation: Secondary | ICD-10-CM

## 2017-03-25 DIAGNOSIS — F17210 Nicotine dependence, cigarettes, uncomplicated: Secondary | ICD-10-CM

## 2017-03-25 MED ORDER — FLUOXETINE HCL 20 MG PO CAPS: 20.0000 mg | ORAL_CAPSULE | Freq: Every day | ORAL | 0 refills | Status: DC
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
## 2017-05-05 ENCOUNTER — Other Ambulatory Visit (HOSPITAL_COMMUNITY): Payer: Self-pay

## 2017-05-05 DIAGNOSIS — F4310 Post-traumatic stress disorder, unspecified: Secondary | ICD-10-CM

## 2017-05-05 MED ORDER — FLUOXETINE HCL 20 MG PO CAPS: 20.0000 mg | ORAL_CAPSULE | Freq: Every day | ORAL | 0 refills | Status: DC

## 2017-06-02 ENCOUNTER — Other Ambulatory Visit: Payer: Self-pay | Admitting: Internal Medicine

## 2017-06-24 ENCOUNTER — Encounter (HOSPITAL_COMMUNITY): Payer: Self-pay | Admitting: Psychiatry

## 2017-06-24 ENCOUNTER — Ambulatory Visit (INDEPENDENT_AMBULATORY_CARE_PROVIDER_SITE_OTHER): Payer: 59 | Admitting: Psychiatry

## 2017-06-24 DIAGNOSIS — F4310 Post-traumatic stress disorder, unspecified: Secondary | ICD-10-CM | POA: Diagnosis not present

## 2017-06-24 DIAGNOSIS — F332 Major depressive disorder, recurrent severe without psychotic features: Secondary | ICD-10-CM | POA: Diagnosis not present

## 2017-06-24 DIAGNOSIS — B20 Human immunodeficiency virus [HIV] disease: Secondary | ICD-10-CM | POA: Diagnosis not present

## 2017-06-24 DIAGNOSIS — F1099 Alcohol use, unspecified with unspecified alcohol-induced disorder: Secondary | ICD-10-CM | POA: Diagnosis not present

## 2017-06-24 DIAGNOSIS — Z87891 Personal history of nicotine dependence: Secondary | ICD-10-CM | POA: Diagnosis not present

## 2017-06-24 DIAGNOSIS — Z91410 Personal history of adult physical and sexual abuse: Secondary | ICD-10-CM | POA: Diagnosis not present

## 2017-06-24 MED ORDER — FLUOXETINE HCL 20 MG PO CAPS: 20.0000 mg | ORAL_CAPSULE | Freq: Every day | ORAL | 0 refills | Status: DC
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
## 2017-07-02 ENCOUNTER — Encounter: Payer: Self-pay | Admitting: *Deleted

## 2017-07-05 ENCOUNTER — Telehealth: Payer: Self-pay

## 2017-07-05 NOTE — Telephone Encounter (Signed)
Attempted Pa for pt's medication Complera through CoverMyMeds. Was instructed to contact the patients insurance as additional information is required. [place]

## 2017-07-05 NOTE — Telephone Encounter (Signed)
After speaking to the insurance company they requested that the pharmacy call them to initiate the PA I spoke with the pharmacist at [facility] who was able to take their call and "take care of the PA" .  [place]

## 2017-07-16 ENCOUNTER — Telehealth: Payer: Self-pay

## 2017-07-16 NOTE — Telephone Encounter (Signed)
Received a call from pharmacy checking on status of PA for Complera. I was contacted pt's insurance to check on the status of the PA was told that the pt's insurance was expired. Contacted the pt to inform them that their insurance was expired and if she had received a new insurance card she would need to inform her pharmacy and bring her card to [place] to scan. Pt is aware and stated she would take care of this today. Will notify pharmacy of this.  [place]

## 2017-07-16 NOTE — Telephone Encounter (Signed)
Contacted [facility] [phone] to inform that the pt currently does not have any insurance coverage Pharmacist stated that the pt does currently have active insurance and that the pt's medication would need an appeal to get full coverage. I called the number given by the pharmacist to initiate the appeal at [phone]. When I called the number representative stated that an appeal was not required and that a PA was not on file for the pt's Complera. The representative stated that we would have to do a PA and fax additional information if required.  [place]

## 2017-07-19 NOTE — Telephone Encounter (Signed)
Complera PA was is approved from 07/19/2017-07/19/2018 with 12 refills 30 units with each fill. Received a fax from Rx benefits stating that the medication was approved. Will notify the pharmacy with this fax. Shannon [name]

## 2017-08-26 ENCOUNTER — Emergency Department (HOSPITAL_BASED_OUTPATIENT_CLINIC_OR_DEPARTMENT_OTHER)
Admission: EM | Admit: 2017-08-26 | Discharge: 2017-08-26 | Disposition: A | Discharge status: Home / Self Care | Payer: 59 | Attending: Emergency Medicine | Admitting: Emergency Medicine

## 2017-08-26 ENCOUNTER — Other Ambulatory Visit: Payer: Self-pay

## 2017-08-26 ENCOUNTER — Emergency Department (HOSPITAL_BASED_OUTPATIENT_CLINIC_OR_DEPARTMENT_OTHER): Payer: 59

## 2017-08-26 ENCOUNTER — Encounter (HOSPITAL_BASED_OUTPATIENT_CLINIC_OR_DEPARTMENT_OTHER): Payer: Self-pay | Admitting: *Deleted

## 2017-08-26 DIAGNOSIS — R05 Cough: Secondary | ICD-10-CM | POA: Insufficient documentation

## 2017-08-26 DIAGNOSIS — B20 Human immunodeficiency virus [HIV] disease: Secondary | ICD-10-CM | POA: Insufficient documentation

## 2017-08-26 DIAGNOSIS — I10 Essential (primary) hypertension: Secondary | ICD-10-CM | POA: Insufficient documentation

## 2017-08-26 DIAGNOSIS — Z79899 Other long term (current) drug therapy: Secondary | ICD-10-CM | POA: Diagnosis not present

## 2017-08-26 DIAGNOSIS — M791 Myalgia, unspecified site: Secondary | ICD-10-CM | POA: Insufficient documentation

## 2017-08-26 DIAGNOSIS — R509 Fever, unspecified: Secondary | ICD-10-CM | POA: Diagnosis present

## 2017-08-26 DIAGNOSIS — J069 Acute upper respiratory infection, unspecified: Secondary | ICD-10-CM | POA: Diagnosis not present

## 2017-08-26 DIAGNOSIS — Z87891 Personal history of nicotine dependence: Secondary | ICD-10-CM | POA: Insufficient documentation

## 2017-08-26 MED ORDER — BENZONATATE 100 MG PO CAPS
100.0000 mg | ORAL_CAPSULE | Freq: Once | ORAL | Status: AC
  Administered 2017-08-26: 100 mg via ORAL
  Filled 2017-08-26: qty 1

## 2017-08-26 MED ORDER — BENZONATATE 100 MG PO CAPS: 100.0000 mg | ORAL_CAPSULE | Freq: Three times a day (TID) | ORAL | 0 refills | Status: DC

## 2017-08-26 MED ORDER — IBUPROFEN 800 MG PO TABS
800.0000 mg | ORAL_TABLET | Freq: Once | ORAL | Status: AC
  Administered 2017-08-26: 800 mg via ORAL
  Filled 2017-08-26: qty 1

## 2017-08-26 MED ORDER — FLUTICASONE PROPIONATE 50 MCG/ACT NA SUSP: 2.0000 | Freq: Every day | NASAL | 0 refills | Status: DC

## 2017-08-26 MED ORDER — ACETAMINOPHEN 500 MG PO TABS
1000.0000 mg | ORAL_TABLET | Freq: Once | ORAL | Status: AC
  Administered 2017-08-26: 1000 mg via ORAL

## 2017-08-26 MED ORDER — ACETAMINOPHEN 500 MG PO TABS
ORAL_TABLET | ORAL | Status: AC
  Filled 2017-08-26: qty 2

## 2017-08-26 NOTE — ED Triage Notes (Signed)
Fever and cough x 1 week. She was told by tele doc that she had the flu and had a Rx for Tamiflu and Tessilon Pearls with no relief.

## 2017-08-26 NOTE — ED Notes (Signed)
Pt. Reports she was dx. With flu on Sunday.  Pt. Said she is here due to having a fever she can't break.  Pt. Temp is 99.0.  Pt. Is on Tamiflu since Sunday.

## 2017-08-26 NOTE — ED Notes (Signed)
Family at bedside. 

## 2017-08-27 ENCOUNTER — Encounter: Payer: Self-pay | Admitting: *Deleted

## 2017-08-27 ENCOUNTER — Encounter (HOSPITAL_BASED_OUTPATIENT_CLINIC_OR_DEPARTMENT_OTHER): Payer: Self-pay | Admitting: Emergency Medicine

## 2017-08-27 NOTE — ED Provider Notes (Signed)
[facility] EMERGENCY DEPARTMENT Provider Note   CSN: [id number] Arrival date & time: 08/26/17  2036     History   Chief Complaint Chief Complaint  Patient presents with  . Fever    HPI Shannon [name] is a 52 y.o. female.  The history is provided by the patient.  Fever   This is a new problem. The current episode started more than 2 days ago. The problem occurs constantly. The problem has not changed since onset.The maximum temperature noted was 101 to 101.9 F. The temperature was taken using an oral thermometer. Associated symptoms include muscle aches and cough. Pertinent negatives include no chest pain, no fussiness, no sleepiness, no diarrhea and no vomiting. She has tried nothing for the symptoms. The treatment provided no relief.  Diagnosed with flu and is on tamiflu but fever and cough persist.  States she did not know she could take tylenol, ibuprofen or any medication with the RX.    Past Medical History:  Diagnosis Date  . Arnold-Chiari malformation ([place])   . HIV disease ([place])   . Hypertension   . Multiple thyroid nodules    Fully functioning thyroid with no treatment indicated    Patient Active Problem List   Diagnosis Date Noted  . Thyromegaly 11/11/2015  . Acute bronchitis 07/08/2015  . Nephrolithiasis 06/24/2015  . DOE (dyspnea on exertion) 04/06/2014  . Other chest pain 04/04/2014  . Thyroid nodule 08/22/2012  . Diarrhea 03/31/2011  . POLYURIA 04/26/2008  . KNEE PAIN, BILATERAL 11/12/2006  . ARTHRITIS, LEFT KNEE 07/29/2006  . POSTPROCEDURAL STATUS NEC 07/14/2006  . Human immunodeficiency virus (HIV) disease ([place]) 04/14/2006  . TOBACCO ABUSE 04/14/2006  . Depression 04/14/2006  . Essential hypertension 04/14/2006  . GERD 04/14/2006    Past Surgical History:  Procedure Laterality Date  . ABDOMINAL HYSTERECTOMY    . BRAIN SURGERY    . CHOLECYSTECTOMY      OB History   None      Home Medications    Prior to Admission  medications   Medication Sig Start Date End Date Taking? Authorizing Provider  benazepril (LOTENSIN) 10 MG tablet Take 1 tablet (10 mg total) by mouth daily. 12/21/16  Yes Dorena Dew, FNP  COMPLERA 200-25-300 MG tablet TAKE 1 TABLET BY MOUTH DAILY 06/02/17  Yes Comer, Okey Regal, MD  FLUoxetine (PROZAC) 20 MG capsule Take 1 capsule (20 mg total) by mouth daily. 06/24/17  Yes Arfeen, Arlyce Harman, MD  benzonatate (TESSALON) 100 MG capsule Take 1 capsule (100 mg total) by mouth every 8 (eight) hours. 08/26/17   Adriella Essex, MD  fluticasone (FLONASE) 50 MCG/ACT nasal spray Place 2 sprays into both nostrils daily. 08/26/17   Zyara Riling, MD    Family History Family History  Problem Relation Age of Onset  . Asthma Mother   . Hypertension Mother   . Diabetes Mother   . Diabetes Father   . Cancer Father        colon at 76 yo  . Breast cancer Neg Hx     Social History Social History   Tobacco Use  . Smoking status: Former Smoker    Packs/day: 0.50    Years: 25.00    Pack years: 12.50    Types: Cigarettes    Last attempt to quit: 03/24/2017    Years since quitting: 0.4  . Smokeless tobacco: Never Used  . Tobacco comment: Patient trying to quit  Substance Use Topics  . Alcohol use: Yes  Alcohol/week: 0.6 oz    Types: 1 Standard drinks or equivalent per week    Comment: rare  . Drug use: No     Allergies   Shellfish allergy   Review of Systems Review of Systems  Constitutional: Positive for fever.  Respiratory: Positive for cough. Negative for shortness of breath.   Cardiovascular: Negative for chest pain and leg swelling.  Gastrointestinal: Negative for diarrhea and vomiting.  Musculoskeletal: Positive for myalgias. Negative for arthralgias, back pain, neck pain and neck stiffness.  Skin: Negative for color change, pallor, rash and wound.  All other systems reviewed and are negative.    Physical Exam Updated Vital Signs BP 137/89 (BP Location: Right Arm)   Pulse  88   Temp 99.6 F (37.6 C) (Oral)   Resp 16   Ht 5\' 2"  (1.575 m)   Wt 92.2 kg (203 lb 4 oz)   SpO2 99%   BMI 37.17 kg/m   Physical Exam  Constitutional: She is oriented to person, place, and time. She appears well-developed and well-nourished. No distress.  HENT:  Head: Normocephalic and atraumatic.  Nose: Nose normal.  Mouth/Throat: Oropharynx is clear and moist. No oropharyngeal exudate.  Eyes: Pupils are equal, round, and reactive to light. Conjunctivae are normal.  Neck: Normal range of motion. Neck supple.  Cardiovascular: Normal rate, regular rhythm, normal heart sounds and intact distal pulses.  Pulmonary/Chest: Effort normal and breath sounds normal. No stridor. No respiratory distress. She has no wheezes. She has no rales. She exhibits no tenderness.  Abdominal: Soft. Bowel sounds are normal. She exhibits no mass. There is no tenderness. There is no rebound and no guarding. No hernia.  Musculoskeletal: Normal range of motion.  Neurological: She is alert and oriented to person, place, and time. She displays normal reflexes. She exhibits normal muscle tone. Coordination normal.  Skin: Skin is warm and dry. Capillary refill takes less than 2 seconds.  Psychiatric: She has a normal mood and affect.  Nursing note and vitals reviewed.    ED Treatments / Results   Radiology Dg Chest 2 View  Result Date: 08/26/2017 CLINICAL DATA:  Flu like symptoms for the past week. EXAM: CHEST - 2 VIEW COMPARISON:  None. FINDINGS: The heart size and mediastinal contours are within normal limits. Normal pulmonary vascularity. No focal consolidation, pleural effusion, or pneumothorax. No acute osseous abnormality. IMPRESSION: No active cardiopulmonary disease. Electronically Signed   By: Titus Dubin M.D.   On: 08/26/2017 21:09    Procedures Procedures (including critical care time)  Medications Ordered in ED Medications  acetaminophen (TYLENOL) tablet 1,000 mg (1,000 mg Oral Given  08/26/17 2157)  benzonatate (TESSALON) capsule 100 mg (100 mg Oral Given 08/26/17 2323)  ibuprofen (ADVIL,MOTRIN) tablet 800 mg (800 mg Oral Given 08/26/17 2323)      Final Clinical Impressions(s) / ED Diagnoses   Final diagnoses:  Viral upper respiratory tract infection   Return for weakness, numbness, changes in vision or speech, fevers >100.4 unrelieved by medication, shortness of breath, intractable vomiting, or diarrhea, abdominal pain, Inability to tolerate liquids or food, cough, altered mental status or any concerns. No signs of systemic illness or infection. The patient is nontoxic-appearing on exam and vital signs are within normal limits.   I have reviewed the triage vital signs and the nursing notes. Pertinent labs &imaging results that were available during my care of the patient were reviewed by me and considered in my medical decision making (see chart for details).  After history,  exam, and medical workup I feel the patient has been appropriately medically screened and is safe for discharge home. Pertinent diagnoses were discussed with the patient. Patient was given return precautions.  ED Discharge Orders        Ordered    fluticasone (FLONASE) 50 MCG/ACT nasal spray  Daily     08/26/17 2322    benzonatate (TESSALON) 100 MG capsule  Every 8 hours     08/26/17 2322       Norvin Ohlin, MD 08/27/17 [phone]

## 2017-09-22 ENCOUNTER — Encounter: Payer: Self-pay | Admitting: Family Medicine

## 2017-09-22 ENCOUNTER — Ambulatory Visit (INDEPENDENT_AMBULATORY_CARE_PROVIDER_SITE_OTHER): Payer: 59 | Admitting: Family Medicine

## 2017-09-22 VITALS — BP 138/87 | HR 89 | Temp 98.6°F | Resp 16 | Ht 65.0 in | Wt 207.0 lb

## 2017-09-22 DIAGNOSIS — B20 Human immunodeficiency virus [HIV] disease: Secondary | ICD-10-CM

## 2017-09-22 DIAGNOSIS — E669 Obesity, unspecified: Secondary | ICD-10-CM

## 2017-09-22 DIAGNOSIS — F17200 Nicotine dependence, unspecified, uncomplicated: Secondary | ICD-10-CM | POA: Diagnosis not present

## 2017-09-22 DIAGNOSIS — G629 Polyneuropathy, unspecified: Secondary | ICD-10-CM | POA: Diagnosis not present

## 2017-09-22 DIAGNOSIS — L600 Ingrowing nail: Secondary | ICD-10-CM

## 2017-09-22 DIAGNOSIS — I10 Essential (primary) hypertension: Secondary | ICD-10-CM | POA: Diagnosis not present

## 2017-09-22 DIAGNOSIS — E041 Nontoxic single thyroid nodule: Secondary | ICD-10-CM

## 2017-09-22 DIAGNOSIS — F3289 Other specified depressive episodes: Secondary | ICD-10-CM | POA: Diagnosis not present

## 2017-09-22 DIAGNOSIS — E66811 Obesity, class 1: Secondary | ICD-10-CM

## 2017-09-22 LAB — POCT URINALYSIS DIPSTICK
BILIRUBIN UA: NEGATIVE
Glucose, UA: NEGATIVE
Ketones, UA: NEGATIVE
LEUKOCYTES UA: NEGATIVE
Nitrite, UA: NEGATIVE
PH UA: 5.5 (ref 5.0–8.0)
Protein, UA: NEGATIVE
RBC UA: NEGATIVE
Spec Grav, UA: 1.015 (ref 1.010–1.025)
UROBILINOGEN UA: 0.2 U/dL

## 2017-09-22 MED ORDER — GABAPENTIN 100 MG PO CAPS: 100.0000 mg | ORAL_CAPSULE | Freq: Every day | ORAL | 3 refills | Status: DC

## 2017-09-22 NOTE — Patient Instructions (Addendum)
Recommend scheduling a follow up with [facility] Endocrinology for thyroid nodule.

## 2017-09-22 NOTE — Progress Notes (Signed)
Subjective:    Patient ID: Shannon [name], female    DOB: [birth year], 52 y.o.   MRN: [id number]  HPI  Shannon [name], a very pleasant 52 year old female with a history of HIV and hypertension presents for follow up.   She continues to follow with  Dr. Bobby Rumpf for HIV, which is stable on current medication regimen. She typically follows with Dr. Johnnye Sima every 6 months. Shannon [name] also has a history of hypertension. She maintains that she has been taking medication consistently.   She is not exercising and is not adherent to low salt diet. She is completing her last semester in college and says that she has not had time to exercise routinely.  She does not check blood pressures at home.  Patient denies chest pain, fatigue, irregular heart beat, near-syncope, orthopnea, palpitations, syncope and tachypnea.  Cardiovascular risk factors include: obesity (BMI >= 30 kg/m2) and smoking/ tobacco exposure.   Shannon [name] is complaining of pain to right great toenail for greater than 1 year.  She says that pain is worsened by wearing shoes. She describes pain as intermittent and sharp. Current pain intensity is 2-3/10. She occasionally uses OTC Ibuprofen to alleviate pain. She has not attempted any other OTC interventions    Past Medical History:  Diagnosis Date  . Arnold-Chiari malformation ([place])   . HIV disease ([place])   . Hypertension   . Multiple thyroid nodules    Fully functioning thyroid with no treatment indicated   Past Surgical History:  Procedure Laterality Date  . ABDOMINAL HYSTERECTOMY    . BRAIN SURGERY    . CHOLECYSTECTOMY      Immunization History  Administered Date(s) Administered  . H1N1 09/24/2008  . Hepatitis B 07/28/2004, 01/21/2005, 12/14/2005  . Hepatitis B, adult 02/22/2014, 04/04/2014, 09/10/2014  . Influenza Split 04/28/2011  . Influenza Whole 04/19/2006, 03/23/2007, 04/26/2008, 03/13/2009, 05/05/2010  . Influenza, Seasonal, Injecte, Preservative Fre  02/21/2014  . Influenza,inj,Quad PF,6+ Mos 04/03/2015  . Pneumococcal Polysaccharide-23 02/04/2004, 03/13/2009   Social History   Socioeconomic History  . Marital status: Single    Spouse name: Not on file  . Number of children: Not on file  . Years of education: Not on file  . Highest education level: Not on file  Occupational History  . Not on file  Social Needs  . Financial resource strain: Not on file  . Food insecurity:    Worry: Not on file    Inability: Not on file  . Transportation needs:    Medical: Not on file    Non-medical: Not on file  Tobacco Use  . Smoking status: Former Smoker    Packs/day: 0.50    Years: 25.00    Pack years: 12.50    Types: Cigarettes    Last attempt to quit: 03/24/2017    Years since quitting: 0.4  . Smokeless tobacco: Never Used  . Tobacco comment: Patient trying to quit  Substance and Sexual Activity  . Alcohol use: Yes    Alcohol/week: 0.6 oz    Types: 1 Standard drinks or equivalent per week    Comment: rare  . Drug use: No  . Sexual activity: Not on file    Comment: declined condoms  Lifestyle  . Physical activity:    Days per week: Not on file    Minutes per session: Not on file  . Stress: Not on file  Relationships  . Social connections:    Talks on phone: Not on file  Gets together: Not on file    Attends religious service: Not on file    Active member of club or organization: Not on file    Attends meetings of clubs or organizations: Not on file    Relationship status: Not on file  . Intimate partner violence:    Fear of current or ex partner: Not on file    Emotionally abused: Not on file    Physically abused: Not on file    Forced sexual activity: Not on file  Other Topics Concern  . Not on file  Social History Narrative  . Not on file   Review of Systems  Constitutional: Negative.  Negative for unexpected weight change.  HENT: Negative.   Eyes: Negative.   Respiratory: Negative.   Cardiovascular:  Negative.   Gastrointestinal: Negative.   Endocrine: Negative for polydipsia, polyphagia and polyuria.  Genitourinary: Negative.   Musculoskeletal: Negative.   Skin: Negative.   Allergic/Immunologic: Negative.   Neurological: Negative for dizziness, seizures, facial asymmetry and numbness.  Hematological: Negative.   Psychiatric/Behavioral: Positive for depression. Negative for sleep disturbance.       Objective:   Physical Exam  Constitutional: She is oriented to person, place, and time. She appears well-developed and well-nourished.  HENT:  Head: Normocephalic and atraumatic.  Right Ear: External ear normal.  Left Ear: External ear normal.  Mouth/Throat: Oropharynx is clear and moist.  Eyes: Pupils are equal, round, and reactive to light. Conjunctivae and EOM are normal.  Neck: Normal range of motion. Neck supple. Thyromegaly present. No thyroid mass present.  Cardiovascular: Normal rate, regular rhythm, normal heart sounds and intact distal pulses.  Pulmonary/Chest: Effort normal and breath sounds normal.  Musculoskeletal: Normal range of motion.  Neurological: She is alert and oriented to person, place, and time. She has normal reflexes.  Skin: Skin is warm and dry.  Right great toenail ingrown, tender to palpation, mild yellowing to toenails.   Psychiatric: She has a normal mood and affect. Her speech is normal and behavior is normal. Judgment and thought content normal.      BP 138/87 (BP Location: Left Arm, Patient Position: Sitting, Cuff Size: Large)   Pulse 89   Temp 98.6 F (37 C) (Oral)   Resp 16   Ht 5\' 5"  (1.651 m)   Wt 207 lb (93.9 kg)   SpO2 98%   BMI 34.45 kg/m  Assessment & Plan:  1. Essential hypertension Patient is at goal on current medication regimen.  No medication changes warranted. We have discussed target BP range and blood pressure goal. I have advised patient to check BP regularly and to call us back or report to clinic if the numbers are  consistently higher than 140/90. We discussed the importance of compliance with medical therapy and DASH diet recommended, consequences of uncontrolled hypertension discussed.  - continue current BP medications   - Urinalysis Dipstick - Comprehensive metabolic panel  2. Neuropathy  - gabapentin (NEURONTIN) 100 MG capsule; Take 1 capsule (100 mg total) by mouth at bedtime.  Dispense: 30 capsule; Refill: 3  3. Human immunodeficiency virus (HIV) disease ([place]) Follow up with Dr. Megan Salon as scheduled and continue HAART therapy as discussed.   4. Tobacco dependence Smoking cessation instruction/counseling given:  counseled patient on the dangers of tobacco use, advised patient to stop smoking, and reviewed strategies to maximize success  5. Other depression Depression is controlled with prozac 20 mg daily. Also, follow up with Dr. Adele Schilder as scheduled.   6. Ingrown  toenail of right foot  - Ambulatory referral to Podiatry  7. Thyroid nodule  - TSH  8. Class 1 obesity due to excess calories without serious comorbidity in adult, unspecified BMI Body mass index is 34.45 kg/m. Recommend a lowfat, low carbohydrate diet divided over 5-6 small meals, increase water intake to 6-8 glasses, and 150 minutes per week of cardiovascular exercise.  Goal is 10 pound weight loss in 6 months  RTC: 6 months for hypertension   Shannon Pounds  MSN, FNP-C [facility] [address] [place], [place] [address] [phone]

## 2017-09-23 ENCOUNTER — Ambulatory Visit (HOSPITAL_COMMUNITY): Payer: Self-pay | Admitting: Psychiatry

## 2017-09-23 ENCOUNTER — Telehealth: Payer: Self-pay

## 2017-09-23 ENCOUNTER — Other Ambulatory Visit: Payer: Self-pay | Admitting: Family Medicine

## 2017-09-23 DIAGNOSIS — E0590 Thyrotoxicosis, unspecified without thyrotoxic crisis or storm: Secondary | ICD-10-CM

## 2017-09-23 LAB — COMPREHENSIVE METABOLIC PANEL
ALT: 20 IU/L (ref 0–32)
AST: 19 IU/L (ref 0–40)
Albumin/Globulin Ratio: 1.5 (ref 1.2–2.2)
Albumin: 4.2 g/dL (ref 3.5–5.5)
Alkaline Phosphatase: 95 IU/L (ref 39–117)
BUN/Creatinine Ratio: 10 (ref 9–23)
BUN: 9 mg/dL (ref 6–24)
Bilirubin Total: 0.3 mg/dL (ref 0.0–1.2)
CALCIUM: 9.6 mg/dL (ref 8.7–10.2)
CO2: 25 mmol/L (ref 20–29)
CREATININE: 0.94 mg/dL (ref 0.57–1.00)
Chloride: 106 mmol/L (ref 96–106)
GFR calc Af Amer: 80 mL/min/{1.73_m2} (ref >59)
GFR, EST NON AFRICAN AMERICAN: 69 mL/min/{1.73_m2} (ref >59)
GLOBULIN, TOTAL: 2.8 g/dL (ref 1.5–4.5)
Glucose: 97 mg/dL (ref 65–99)
Potassium: 3.9 mmol/L (ref 3.5–5.2)
SODIUM: 144 mmol/L (ref 134–144)
Total Protein: 7 g/dL (ref 6.0–8.5)

## 2017-09-23 LAB — TSH: TSH: 0.346 u[IU]/mL — ABNORMAL LOW (ref 0.450–4.500)

## 2017-09-23 MED ORDER — METHIMAZOLE 5 MG PO TABS
5.0000 mg | ORAL_TABLET | Freq: Two times a day (BID) | ORAL | 1 refills | Status: DC
Start: 2017-09-23 — End: 2018-01-17

## 2017-09-23 NOTE — Progress Notes (Signed)
Meds ordered this encounter  Medications  . methimazole (TAPAZOLE) 5 MG tablet    Sig: Take 1 tablet (5 mg total) by mouth 2 (two) times daily.    Dispense:  60 tablet    Refill:  [place]  MSN, FNP-C [facility] [address] [place], [place] [address] [phone]

## 2017-09-23 NOTE — Telephone Encounter (Signed)
Called and spoke with patient, advised that TSH is low and that it is consistent with hyperthyroidism, advised that we are starting a trial of methimazole. Advised that it has been sent into pharmacy and we will recheck in 6 weeks. Asked that she schedule a follow up with endocrinology as well. Thanks!

## 2017-09-23 NOTE — Telephone Encounter (Signed)
-----   Message from Dorena Dew, [place] sent at 09/23/2017  3:19 PM EDT ----- Regarding: lab results Please inform patient that TSH is low, which is consistent with hyperthyroidism. Will start a trial of methimazole and follow up in 6 weeks for labs. Also, recommend scheduling follow up with endocrinology.    Donia Pounds  MSN, FNP-C [facility] [address] [place], [place] [address] [phone]

## 2017-09-27 ENCOUNTER — Other Ambulatory Visit: Payer: Self-pay | Admitting: Internal Medicine

## 2017-10-01 ENCOUNTER — Encounter (INDEPENDENT_AMBULATORY_CARE_PROVIDER_SITE_OTHER): Payer: 59 | Admitting: *Deleted

## 2017-10-01 VITALS — BP 146/90 | HR 79 | Temp 98.7°F | Wt 208.0 lb

## 2017-10-01 DIAGNOSIS — Z006 Encounter for examination for normal comparison and control in clinical research program: Secondary | ICD-10-CM

## 2017-10-01 NOTE — Progress Notes (Signed)
Shannon [name] is here for her month 59 visit for [facility] after missing the last research appointment./ She says she is doing fine and has no complaints. She has been diagnosed recently with low thyroid and has started medication. She says her adherence is good with the study meds and she denies any muscle aches or weakness. I have scheduled her for labs and a visit with Dr. Johnnye Sima as it has been a year since her last visit with him.

## 2017-10-09 ENCOUNTER — Ambulatory Visit (INDEPENDENT_AMBULATORY_CARE_PROVIDER_SITE_OTHER): Payer: 59 | Admitting: Psychiatry

## 2017-10-09 ENCOUNTER — Encounter (HOSPITAL_COMMUNITY): Payer: Self-pay | Admitting: Psychiatry

## 2017-10-09 ENCOUNTER — Other Ambulatory Visit: Payer: Self-pay

## 2017-10-09 ENCOUNTER — Ambulatory Visit (HOSPITAL_COMMUNITY): Payer: 59 | Admitting: Psychiatry

## 2017-10-09 DIAGNOSIS — Z915 Personal history of self-harm: Secondary | ICD-10-CM

## 2017-10-09 DIAGNOSIS — F4310 Post-traumatic stress disorder, unspecified: Secondary | ICD-10-CM

## 2017-10-09 DIAGNOSIS — F339 Major depressive disorder, recurrent, unspecified: Secondary | ICD-10-CM | POA: Diagnosis not present

## 2017-10-09 DIAGNOSIS — Z87891 Personal history of nicotine dependence: Secondary | ICD-10-CM | POA: Diagnosis not present

## 2017-10-09 DIAGNOSIS — Z21 Asymptomatic human immunodeficiency virus [HIV] infection status: Secondary | ICD-10-CM

## 2017-10-09 DIAGNOSIS — Z79899 Other long term (current) drug therapy: Secondary | ICD-10-CM

## 2017-10-09 DIAGNOSIS — E349 Endocrine disorder, unspecified: Secondary | ICD-10-CM | POA: Diagnosis not present

## 2017-10-09 MED ORDER — FLUOXETINE HCL 20 MG PO CAPS: 20.0000 mg | ORAL_CAPSULE | Freq: Every day | ORAL | 0 refills | Status: DC
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
## 2017-10-27 ENCOUNTER — Other Ambulatory Visit: Payer: 59

## 2017-10-27 ENCOUNTER — Other Ambulatory Visit (HOSPITAL_COMMUNITY)
Admission: RE | Admit: 2017-10-27 | Discharge: 2017-10-27 | Disposition: A | Discharge status: Home / Self Care | Payer: 59 | Source: Ambulatory Visit | Attending: Infectious Diseases | Admitting: Infectious Diseases

## 2017-10-27 DIAGNOSIS — B20 Human immunodeficiency virus [HIV] disease: Secondary | ICD-10-CM

## 2017-10-28 LAB — RPR: RPR: NONREACTIVE

## 2017-10-28 LAB — COMPLETE METABOLIC PANEL WITH GFR
AG Ratio: 1.7 (calc) (ref 1.0–2.5)
ALT: 17 U/L (ref 6–29)
AST: 15 U/L (ref 10–35)
Albumin: 4.3 g/dL (ref 3.6–5.1)
Alkaline phosphatase (APISO): 89 U/L (ref 33–130)
BILIRUBIN TOTAL: 0.4 mg/dL (ref 0.2–1.2)
BUN: 9 mg/dL (ref 7–25)
CALCIUM: 9.4 mg/dL (ref 8.6–10.4)
CHLORIDE: 108 mmol/L (ref 98–110)
CO2: 29 mmol/L (ref 20–32)
Creat: 0.99 mg/dL (ref 0.50–1.05)
GFR, EST AFRICAN AMERICAN: 75 mL/min/{1.73_m2} (ref >=60)
GFR, Est Non African American: 65 mL/min/{1.73_m2} (ref >=60)
GLUCOSE: 105 mg/dL — AB (ref 65–99)
Globulin: 2.6 g/dL (calc) (ref 1.9–3.7)
Potassium: 4.2 mmol/L (ref 3.5–5.3)
Sodium: 143 mmol/L (ref 135–146)
TOTAL PROTEIN: 6.9 g/dL (ref 6.1–8.1)

## 2017-10-28 LAB — CBC WITH DIFFERENTIAL/PLATELET
BASOS ABS: 44 {cells}/uL (ref 0–200)
BASOS PCT: 0.6 %
EOS PCT: 0.8 %
Eosinophils Absolute: 58 cells/uL (ref 15–500)
HEMATOCRIT: 41.9 % (ref 35.0–45.0)
HEMOGLOBIN: 14.2 g/dL (ref 11.7–15.5)
LYMPHS ABS: 3767 {cells}/uL (ref 850–3900)
MCH: 28 pg (ref 27.0–33.0)
MCHC: 33.9 g/dL (ref 32.0–36.0)
MCV: 82.6 fL (ref 80.0–100.0)
MONOS PCT: 9.1 %
MPV: 9.5 fL (ref 7.5–12.5)
NEUTROS ABS: 2767 {cells}/uL (ref 1500–7800)
Neutrophils Relative %: 37.9 %
PLATELETS: 245 10*3/uL (ref 140–400)
RBC: 5.07 10*6/uL (ref 3.80–5.10)
RDW: 14.6 % (ref 11.0–15.0)
Total Lymphocyte: 51.6 %
WBC mixed population: 664 cells/uL (ref 200–950)
WBC: 7.3 10*3/uL (ref 3.8–10.8)

## 2017-10-28 LAB — URINE CYTOLOGY ANCILLARY ONLY
Chlamydia: NEGATIVE
NEISSERIA GONORRHEA: NEGATIVE

## 2017-10-28 LAB — T-HELPER CELL (CD4) - (RCID CLINIC ONLY)
CD4 % Helper T Cell: 32 % — ABNORMAL LOW (ref 33–55)
CD4 T CELL ABS: 1230 /uL (ref 400–2700)

## 2017-10-29 LAB — HIV-1 RNA QUANT-NO REFLEX-BLD
HIV 1 RNA Quant: <20 copies/mL — AB
HIV-1 RNA Quant, Log: <1.3 Log copies/mL — AB

## 2017-11-08 ENCOUNTER — Encounter: Payer: Self-pay | Admitting: Infectious Diseases

## 2017-11-08 ENCOUNTER — Other Ambulatory Visit: Payer: 59

## 2017-11-08 ENCOUNTER — Ambulatory Visit: Payer: 59 | Admitting: Infectious Diseases

## 2017-11-08 ENCOUNTER — Other Ambulatory Visit: Payer: Self-pay | Admitting: Family Medicine

## 2017-11-08 VITALS — BP 149/99 | HR 73 | Temp 98.5°F | Ht 62.0 in | Wt 209.8 lb

## 2017-11-08 DIAGNOSIS — E041 Nontoxic single thyroid nodule: Secondary | ICD-10-CM

## 2017-11-08 DIAGNOSIS — B20 Human immunodeficiency virus [HIV] disease: Secondary | ICD-10-CM | POA: Diagnosis not present

## 2017-11-08 DIAGNOSIS — I10 Essential (primary) hypertension: Secondary | ICD-10-CM

## 2017-11-08 DIAGNOSIS — Z23 Encounter for immunization: Secondary | ICD-10-CM

## 2017-11-08 DIAGNOSIS — E039 Hypothyroidism, unspecified: Secondary | ICD-10-CM

## 2017-11-08 DIAGNOSIS — E0590 Thyrotoxicosis, unspecified without thyrotoxic crisis or storm: Secondary | ICD-10-CM

## 2017-11-08 DIAGNOSIS — Z79899 Other long term (current) drug therapy: Secondary | ICD-10-CM | POA: Diagnosis not present

## 2017-11-08 DIAGNOSIS — Z113 Encounter for screening for infections with a predominantly sexual mode of transmission: Secondary | ICD-10-CM | POA: Diagnosis not present

## 2017-11-08 DIAGNOSIS — F3289 Other specified depressive episodes: Secondary | ICD-10-CM

## 2017-11-08 MED ORDER — EMTRICITAB-RILPIVIR-TENOFOV AF 200-25-25 MG PO TABS: 1.0000 | ORAL_TABLET | Freq: Every day | ORAL | 3 refills | Status: DC

## 2017-11-08 NOTE — Addendum Note (Signed)
Addended by: Adelina Mings on: 11/08/2017 04:18 PM   Modules accepted: Orders

## 2017-11-08 NOTE — Assessment & Plan Note (Signed)
>>  ASSESSMENT AND PLAN FOR THYROID  NODULE WRITTEN ON 11/08/2017 11:41 AM BY HATCHER, JEFFREY C, MD  Will re-refer her to endo

## 2017-11-08 NOTE — Assessment & Plan Note (Signed)
Will continue to watch She admits to forgetting to take her rx.

## 2017-11-08 NOTE — Assessment & Plan Note (Signed)
She continues to f/u with psychiatry and psychology

## 2017-11-08 NOTE — Assessment & Plan Note (Signed)
Will re-refer her to endo

## 2017-11-08 NOTE — Progress Notes (Signed)
Orders Placed This Encounter  Procedures  . Thyroid Panel With TSH    Standing Status:   Future    Standing Expiration Date:   11/09/2018     Donia Pounds  MSN, FNP-C [facility] [address] [place], [place] [address] [phone]

## 2017-11-08 NOTE — Assessment & Plan Note (Signed)
She is doing well Will give her prevnar She needs pap Will change her ART to [facility], given copay card.

## 2017-11-08 NOTE — Progress Notes (Signed)
   Subjective:    Patient ID: Shannon [name], female    DOB: [birth year], 52 y.o.   MRN: [id number]  HPI 53 yo F HIV+, previous Chairi 1 repair (2000). Prev taking atripla, changed to [facility] 2016. Has not had PAP or colon (scheduled, rescheduled) She had thyroid scan on 09-10-16 showing: Multinodular thyroid gland demonstrating small warm and cold nodules in both lobes. Completed her work at [employer], going to go to [employer] for further work Anxiety level is steady. Attributes to work. Has new therapist in last month as well as Dr Sharl Ma for psychiatry (on prozac).  Needs to make f/u appt with endocrinologist.  Lat PAP was ? Not last year.  Has not had colon.  Was started on neurontin for neuropathy, feels like it is getting better.   HIV 1 RNA Quant (copies/mL)  Date Value  10/27/2017 <20 DETECTED (A)  09/28/2016 <20 NOT DETECTED  04/03/2016 <20   CD4 T Cell Abs (/uL)  Date Value  10/27/2017 1,230  09/28/2016 660  04/03/2016 1,120    Review of Systems  Constitutional: Negative for appetite change and unexpected weight change.  Respiratory: Negative for cough and shortness of breath.   Gastrointestinal: Negative for constipation and diarrhea.  Genitourinary: Negative for difficulty urinating.  Neurological: Positive for numbness.   Has been walking more.  Please see HPI. All other systems reviewed and negative.     Objective:   Physical Exam  Constitutional: She appears well-developed and well-nourished.  HENT:  Mouth/Throat: No oropharyngeal exudate.  Eyes: Pupils are equal, round, and reactive to light. EOM are normal.  Neck: Normal range of motion. Neck supple. Thyromegaly present.  Cardiovascular: Normal rate and regular rhythm.  Pulmonary/Chest: Effort normal and breath sounds normal.  Abdominal: Soft. Bowel sounds are normal. There is no tenderness. There is no guarding.  Musculoskeletal: Normal range of motion. She exhibits no edema.  Lymphadenopathy:    She  has no cervical adenopathy.  Psychiatric: She has a normal mood and affect.          Assessment & Plan:

## 2017-11-09 LAB — THYROID PANEL WITH TSH
FREE THYROXINE INDEX: 1.6 (ref 1.2–4.9)
T3 UPTAKE RATIO: 25 % (ref 24–39)
T4 TOTAL: 6.2 ug/dL (ref 4.5–12.0)
TSH: 0.826 u[IU]/mL (ref 0.450–4.500)

## 2017-11-10 ENCOUNTER — Telehealth: Payer: Self-pay

## 2017-11-10 NOTE — Telephone Encounter (Signed)
-----   Message from Dorena Dew, [place] sent at 11/10/2017  5:14 AM EDT ----- Regarding: lab results Please inform patient that thyroid panel with within normal limits. Also, advise Ms. [place] to follow up in office as scheduled.   Donia Pounds  MSN, FNP-C [facility] [address] [place], [place] [address] [phone]

## 2017-11-10 NOTE — Telephone Encounter (Signed)
Called, no answer and mailbox was full. Will try later. Thanks!

## 2017-11-30 ENCOUNTER — Encounter: Payer: Self-pay | Admitting: Infectious Diseases

## 2017-11-30 ENCOUNTER — Other Ambulatory Visit (HOSPITAL_COMMUNITY)
Admission: RE | Admit: 2017-11-30 | Discharge: 2017-11-30 | Disposition: A | Discharge status: Home / Self Care | Payer: 59 | Source: Ambulatory Visit | Attending: Infectious Diseases | Admitting: Infectious Diseases

## 2017-11-30 ENCOUNTER — Ambulatory Visit (INDEPENDENT_AMBULATORY_CARE_PROVIDER_SITE_OTHER): Payer: 59 | Admitting: Infectious Diseases

## 2017-11-30 VITALS — BP 146/106 | HR 77 | Temp 98.9°F | Resp 16 | Ht 62.0 in | Wt 211.0 lb

## 2017-11-30 DIAGNOSIS — Z124 Encounter for screening for malignant neoplasm of cervix: Secondary | ICD-10-CM | POA: Diagnosis present

## 2017-11-30 NOTE — Patient Instructions (Signed)
Will send you a mychart message with your results if normal.   We talked about considering spacing out pap smears from yearly to every 3 years today.   Mammogram due in October 2019.

## 2017-11-30 NOTE — Progress Notes (Signed)
      Subjective:    Shannon [name] is a 52 y.o. female here for an annual pelvic exam and pap smear.   Review of Systems: Current GYN complaints or concerns: none - she is s/p hysterectomy with removal of cervix. She does have a history of abnormal pap smear in the past but never requiring of colpo/LEEP.   Patient denies any abdominal/pelvic pain, problems with bowel movements, urination, vaginal discharge or intercourse.   Past Medical History:  Diagnosis Date  . Arnold-Chiari malformation ([place])   . HIV disease ([place])   . Hypertension   . Multiple thyroid nodules    Fully functioning thyroid with no treatment indicated    Gynecologic History: No obstetric history on file.  No LMP recorded. Patient has had a hysterectomy. Contraception: status post hysterectomy Last Pap: 11/2013. Results were: normal Anal Intercourse: no Last Mammogram: 03/2017. Results were: normal  Objective:  Physical Exam  Constitutional: Well developed, well nourished, no acute distress. She is alert and oriented x3.  Pelvic: External genitalia is normal in appearance. The vagina is normal in appearance. The cervix is surgically absent. Uterus is surgically absent. No adnexal masses or tenderness noted.  Breasts: symmetrical in contour, shape and texture.  Psych: She has a normal mood and affect.    Assessment:  Normal pelvic and bimanual exam. Thin prep pap was obtained and sent for cytology with reflex HPV and GC/C today.   Plan:  Health Maintenance =   Return in 1 year for annual pap screening unless indicated sooner. Could consider pushing out to q3y per guidelines if > 3 consecutively normal studies. No previous HPV done for co-testing - will await these results.   She has been counseled and instructed how to perform monthly self breast exams.  Screening mammogram due in October   She is due for colonoscopy - father has a history of this and we discussed going through with this today.    Contraception / Family Planning =   S/P hysterectomy   HIV =   She will continue her [place] and F/U as scheduled with Dr. Johnnye Sima for ongoing HIV care.   Shannon Madeira, MSN, NP-C [facility] Office: [phone] Pager: [phone]  11/30/17 8:54 AM

## 2017-12-01 LAB — CYTOLOGY - PAP
CHLAMYDIA, DNA PROBE: NEGATIVE
Diagnosis: NEGATIVE
HPV (WINDOPATH): NOT DETECTED
NEISSERIA GONORRHEA: NEGATIVE

## 2017-12-08 NOTE — Progress Notes (Signed)
Pap smear normal, HPV negative. She does not have cervix but does have history of abnormal pap smears. Will consider q3y testing once 3 serial negatives obtained. Repeat in 1 year. MyChart sent to patient.

## 2018-01-10 ENCOUNTER — Ambulatory Visit (HOSPITAL_COMMUNITY): Payer: 59 | Admitting: Psychiatry

## 2018-01-17 ENCOUNTER — Other Ambulatory Visit: Payer: Self-pay | Admitting: Family Medicine

## 2018-01-17 DIAGNOSIS — E0590 Thyrotoxicosis, unspecified without thyrotoxic crisis or storm: Secondary | ICD-10-CM

## 2018-02-09 ENCOUNTER — Ambulatory Visit: Payer: Self-pay | Admitting: Endocrinology

## 2018-02-11 ENCOUNTER — Other Ambulatory Visit: Payer: Self-pay | Admitting: Family Medicine

## 2018-02-11 DIAGNOSIS — I10 Essential (primary) hypertension: Secondary | ICD-10-CM

## 2018-02-17 ENCOUNTER — Encounter (INDEPENDENT_AMBULATORY_CARE_PROVIDER_SITE_OTHER): Payer: 59 | Admitting: *Deleted

## 2018-02-17 VITALS — BP 130/90 | HR 81 | Wt 213.2 lb

## 2018-02-17 DIAGNOSIS — Z006 Encounter for examination for normal comparison and control in clinical research program: Secondary | ICD-10-CM

## 2018-02-18 ENCOUNTER — Other Ambulatory Visit: Payer: Self-pay | Admitting: Infectious Diseases

## 2018-02-18 DIAGNOSIS — B20 Human immunodeficiency virus [HIV] disease: Secondary | ICD-10-CM

## 2018-02-18 NOTE — Progress Notes (Signed)
Shannon [name] was here for her month 73 visit for Reprieve, A Randomized Trial to Prevent Vascular Events in HIV (study drug is Pitavastatin 4mg  or placebo). She denies any current complaints and is in good spirits.She said she had been doing very well at taking her meds, both ARvs and study meds every day. She said she had an upcoming appointment with Dr. Johnnye Sima and would get her flushot then. She will be returning for study in January.

## 2018-02-22 ENCOUNTER — Other Ambulatory Visit: Payer: Self-pay

## 2018-02-22 DIAGNOSIS — E0590 Thyrotoxicosis, unspecified without thyrotoxic crisis or storm: Secondary | ICD-10-CM

## 2018-02-22 MED ORDER — METHIMAZOLE 5 MG PO TABS: ORAL_TABLET | ORAL | 0 refills | Status: DC

## 2018-03-08 ENCOUNTER — Ambulatory Visit (INDEPENDENT_AMBULATORY_CARE_PROVIDER_SITE_OTHER): Payer: 59 | Admitting: Psychiatry

## 2018-03-08 ENCOUNTER — Encounter (HOSPITAL_COMMUNITY): Payer: Self-pay | Admitting: Psychiatry

## 2018-03-08 DIAGNOSIS — F4310 Post-traumatic stress disorder, unspecified: Secondary | ICD-10-CM | POA: Diagnosis not present

## 2018-03-08 DIAGNOSIS — F332 Major depressive disorder, recurrent severe without psychotic features: Secondary | ICD-10-CM

## 2018-03-08 DIAGNOSIS — Z87891 Personal history of nicotine dependence: Secondary | ICD-10-CM

## 2018-03-08 MED ORDER — FLUOXETINE HCL 20 MG PO CAPS: 20.0000 mg | ORAL_CAPSULE | Freq: Every day | ORAL | 0 refills | Status: DC
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
## 2018-03-24 ENCOUNTER — Ambulatory Visit (INDEPENDENT_AMBULATORY_CARE_PROVIDER_SITE_OTHER): Payer: 59 | Admitting: Family Medicine

## 2018-03-24 ENCOUNTER — Encounter: Payer: Self-pay | Admitting: Family Medicine

## 2018-03-24 VITALS — BP 142/80 | HR 74 | Temp 97.8°F | Ht 62.0 in | Wt 216.0 lb

## 2018-03-24 DIAGNOSIS — Z131 Encounter for screening for diabetes mellitus: Secondary | ICD-10-CM | POA: Diagnosis not present

## 2018-03-24 DIAGNOSIS — E0590 Thyrotoxicosis, unspecified without thyrotoxic crisis or storm: Secondary | ICD-10-CM

## 2018-03-24 DIAGNOSIS — Z1211 Encounter for screening for malignant neoplasm of colon: Secondary | ICD-10-CM

## 2018-03-24 DIAGNOSIS — Z862 Personal history of diseases of the blood and blood-forming organs and certain disorders involving the immune mechanism: Secondary | ICD-10-CM

## 2018-03-24 DIAGNOSIS — F17200 Nicotine dependence, unspecified, uncomplicated: Secondary | ICD-10-CM

## 2018-03-24 DIAGNOSIS — I10 Essential (primary) hypertension: Secondary | ICD-10-CM | POA: Diagnosis not present

## 2018-03-24 LAB — POCT URINALYSIS DIP (MANUAL ENTRY)
Bilirubin, UA: NEGATIVE
Blood, UA: NEGATIVE
Glucose, UA: NEGATIVE mg/dL
Ketones, POC UA: NEGATIVE mg/dL
Leukocytes, UA: NEGATIVE
Nitrite, UA: NEGATIVE
Protein Ur, POC: NEGATIVE mg/dL
Spec Grav, UA: 1.015 (ref 1.010–1.025)
Urobilinogen, UA: 0.2 E.U./dL
pH, UA: 6 (ref 5.0–8.0)

## 2018-03-24 LAB — POCT GLYCOSYLATED HEMOGLOBIN (HGB A1C): Hemoglobin A1C: 6 % — AB (ref 4.0–5.6)

## 2018-03-24 NOTE — Progress Notes (Signed)
   Patient Shannon Shannon [name]  Chronic Disease Follow Up Provider: Lanae Boast, Shannon Shannon [name]  SUBJECTIVE:  Patient presents for follow up for the following  chronic conditions.    Hypertension  Blood pressure is well controlled at home. Patient denies chest pain, dyspnea, fatigue, lower extremity edema and syncope. Medication compliance: Yes  Pre-Diabetes She  is not exercising and is not adherent to a low-salt, ADA, heart healthy or carbohydrate modified diet.   Review of Systems  Constitutional: Negative.   HENT: Negative.   Eyes: Negative.   Respiratory: Negative.   Cardiovascular: Negative.   Gastrointestinal: Negative.   Genitourinary: Negative.   Musculoskeletal: Negative.   Skin: Negative.   Neurological: Negative.   Psychiatric/Behavioral: Negative.     OBJECTIVE:  BP (!) 142/80 (BP Location: Left Arm, Patient Position: Sitting, Cuff Size: Small)   Pulse 74   Temp 97.8 F (36.6 C) (Oral)   Ht 5\' 2"  (1.575 m)   Wt 216 lb (98 kg)   SpO2 98%   BMI 39.51 kg/m   Physical Exam  Constitutional: She is oriented to person, place, and time and well-developed, well-nourished, and in no distress. No distress.  HENT:  Head: Normocephalic and atraumatic.  Eyes: Pupils are equal, round, and reactive to light. Conjunctivae and EOM are normal.  Neck: Normal range of motion. Neck supple.  Cardiovascular: Normal rate, regular rhythm and intact distal pulses. Exam reveals no gallop and no friction rub.  No murmur heard. Pulmonary/Chest: Effort normal and breath sounds normal. No respiratory distress. She has no wheezes.  Abdominal: Soft. Bowel sounds are normal. There is no tenderness.  Musculoskeletal: Normal range of motion. She exhibits no edema or tenderness.  Lymphadenopathy:    She has no cervical adenopathy.  Neurological: She is alert and oriented to person, place, and time. Gait normal.  Skin: Skin is warm and dry.  Psychiatric: Mood,  memory, affect and judgment normal.  Nursing note and vitals reviewed.    ASSESSMENT/PLAN: 1. Screening for diabetes mellitus A1c is 6.0. Pre-diabetic. The patient is asked to make an attempt to improve diet and exercise patterns to aid in medical management of this problem.  - POCT urinalysis dipstick - POCT glycosylated hemoglobin (Hb A1C)  2. Screen for colon cancer - Cologuard - HM COLONOSCOPY; Future  3. Essential hypertension The current medical regimen is effective;  continue present plan and medications.  - Comprehensive metabolic panel  4. Hyperthyroidism The current medical regimen is effective;  continue present plan and medications.  - Thyroid Panel With TSH  5. Tobacco dependence Smoking cessation instruction/counseling given:  counseled patient on the dangers of tobacco use, advised patient to stop smoking, and reviewed strategies to maximize success  6. History of anemia Pending labs. Will adjust medications accordingly.    - CBC with Differential - Iron, TIBC and Ferritin Panel      Return to Shannon [name] as scheduled and prn. Patient verbalized understanding and agreed with plan of Shannon [name].   Ms. Shannon Shannon [name], Shannon Shannon Shannon [name] Patient [facility] [address] [place], [place] [address] [phone]

## 2018-03-24 NOTE — Patient Instructions (Addendum)
The 2018 Physical Activity Guidelines recommend the equivalent of 150 minutes per week of moderate to vigorous aerobic activity each week, with muscle-strengthening activities on two days during the week    PRESCRIPTION FOR EXERCISE  Frequency Four to five days per week  Intensity Moderate- During moderate intensity exercise, a person is too winded to sing but is not so winded they cannot talk  Time 30 minutes or a total of 150 minutes per week.   Type Brisk walking PLUS One set each of: 0 Body weight squats  10 0 Plank hold for 30 seconds   Basic bodyweight exercise guide: Squat and plank  (A) Body weight squat: 0 The squat develops strength in the legs and torso. Stand with your feet about shoulder's width apart and slightly externally rotated. Maintain your weight on your heels or midfoot throughout the exercise. Keep your lower back flat; do not allow it to round or to extend excessively. Your knees should remain aligned with your ankles and legs throughout the motion (knees caving inward towards one another is a common problem). Descend until your hips come just below the level of your knees. If strength or mobility limitations prevent you from reaching this depth, begin with partial squats and gradually increase the depth over time. Once you can perform 15 squats with proper technique and full depth, make the exercise more challenging by holding a weight. (B) Standard plank: 0 The basic plank develops torso (core) strength. Maintain a straight torso, not allowing your lower back to sag or your hips to elevate, throughout the exercise. Gradually increase the time you can hold the position.   Hypertension Hypertension is another name for high blood pressure. High blood pressure forces your heart to work harder to pump blood. This can cause problems over time. There are two numbers in a blood pressure reading. There is a top number (systolic) over a bottom number (diastolic). It is best  to have a blood pressure below 120/80. Healthy choices can help lower your blood pressure. You may need medicine to help lower your blood pressure if:  Your blood pressure cannot be lowered with healthy choices.  Your blood pressure is higher than 130/80.  Follow these instructions at home: Eating and drinking  If directed, follow the DASH eating plan. This diet includes: ? Filling half of your plate at each meal with fruits and vegetables. ? Filling one quarter of your plate at each meal with whole grains. Whole grains include whole wheat pasta, brown rice, and whole grain bread. ? Eating or drinking low-fat dairy products, such as skim milk or low-fat yogurt. ? Filling one quarter of your plate at each meal with low-fat (lean) proteins. Low-fat proteins include fish, skinless chicken, eggs, beans, and tofu. ? Avoiding fatty meat, cured and processed meat, or chicken with skin. ? Avoiding premade or processed food.  Eat less than 1,500 mg of salt (sodium) a day.  Limit alcohol use to no more than 1 drink a day for nonpregnant women and 2 drinks a day for men. One drink equals 12 oz of beer, 5 oz of wine, or 1 oz of hard liquor. Lifestyle  Work with your doctor to stay at a healthy weight or to lose weight. Ask your doctor what the best weight is for you.  Get at least 30 minutes of exercise that causes your heart to beat faster (aerobic exercise) most days of the week. This may include walking, swimming, or biking.  Get at least 30  minutes of exercise that strengthens your muscles (resistance exercise) at least 3 days a week. This may include lifting weights or pilates.  Do not use any products that contain nicotine or tobacco. This includes cigarettes and e-cigarettes. If you need help quitting, ask your doctor.  Check your blood pressure at home as told by your doctor.  Keep all follow-up visits as told by your doctor. This is important. Medicines  Take over-the-counter and  prescription medicines only as told by your doctor. Follow directions carefully.  Do not skip doses of blood pressure medicine. The medicine does not work as well if you skip doses. Skipping doses also puts you at risk for problems.  Ask your doctor about side effects or reactions to medicines that you should watch for. Contact a doctor if:  You think you are having a reaction to the medicine you are taking.  You have headaches that keep coming back (recurring).  You feel dizzy.  You have swelling in your ankles.  You have trouble with your vision. Get help right away if:  You get a very bad headache.  You start to feel confused.  You feel weak or numb.  You feel faint.  You get very bad pain in your: ? Chest. ? Belly (abdomen).  You throw up (vomit) more than once.  You have trouble breathing. Summary  Hypertension is another name for high blood pressure.  Making healthy choices can help lower blood pressure. If your blood pressure cannot be controlled with healthy choices, you may need to take medicine. This information is not intended to replace advice given to you by your health care provider. Make sure you discuss any questions you have with your health care provider. Document Released: 11/11/2007 Document Revised: 04/22/2016 Document Reviewed: 04/22/2016 Elsevier Interactive Patient Education  [employer].

## 2018-03-25 LAB — COMPREHENSIVE METABOLIC PANEL
ALT: 18 IU/L (ref 0–32)
AST: 15 IU/L (ref 0–40)
Albumin/Globulin Ratio: 1.7 (ref 1.2–2.2)
Albumin: 4.2 g/dL (ref 3.5–5.5)
Alkaline Phosphatase: 83 IU/L (ref 39–117)
BUN/Creatinine Ratio: 12 (ref 9–23)
BUN: 12 mg/dL (ref 6–24)
Bilirubin Total: 0.2 mg/dL (ref 0.0–1.2)
CO2: 23 mmol/L (ref 20–29)
Calcium: 9.4 mg/dL (ref 8.7–10.2)
Chloride: 105 mmol/L (ref 96–106)
Creatinine, Ser: 1 mg/dL (ref 0.57–1.00)
GFR calc Af Amer: 74 mL/min/{1.73_m2} (ref >59)
GFR calc non Af Amer: 64 mL/min/{1.73_m2} (ref >59)
Globulin, Total: 2.5 g/dL (ref 1.5–4.5)
Glucose: 83 mg/dL (ref 65–99)
Potassium: 4.2 mmol/L (ref 3.5–5.2)
Sodium: 140 mmol/L (ref 134–144)
Total Protein: 6.7 g/dL (ref 6.0–8.5)

## 2018-03-25 LAB — CBC WITH DIFFERENTIAL/PLATELET
Basophils Absolute: 0 10*3/uL (ref 0.0–0.2)
Basos: 1 %
EOS (ABSOLUTE): 0.1 10*3/uL (ref 0.0–0.4)
Eos: 1 %
Hematocrit: 40 % (ref 34.0–46.6)
Hemoglobin: 14 g/dL (ref 11.1–15.9)
Immature Grans (Abs): 0 10*3/uL (ref 0.0–0.1)
Immature Granulocytes: 0 %
Lymphocytes Absolute: 4.1 10*3/uL — ABNORMAL HIGH (ref 0.7–3.1)
Lymphs: 52 %
MCH: 29.3 pg (ref 26.6–33.0)
MCHC: 35 g/dL (ref 31.5–35.7)
MCV: 84 fL (ref 79–97)
Monocytes Absolute: 1 10*3/uL — ABNORMAL HIGH (ref 0.1–0.9)
Monocytes: 12 %
Neutrophils Absolute: 2.7 10*3/uL (ref 1.4–7.0)
Neutrophils: 34 %
Platelets: 232 10*3/uL (ref 150–450)
RBC: 4.78 x10E6/uL (ref 3.77–5.28)
RDW: 14.2 % (ref 12.3–15.4)
WBC: 8 10*3/uL (ref 3.4–10.8)

## 2018-03-25 LAB — IRON,TIBC AND FERRITIN PANEL
Ferritin: 66 ng/mL (ref 15–150)
Iron Saturation: 24 % (ref 15–55)
Iron: 66 ug/dL (ref 27–159)
Total Iron Binding Capacity: 273 ug/dL (ref 250–450)
UIBC: 207 ug/dL (ref 131–425)

## 2018-03-25 LAB — THYROID PANEL WITH TSH
Free Thyroxine Index: 1.5 (ref 1.2–4.9)
T3 Uptake Ratio: 25 % (ref 24–39)
T4, Total: 6 ug/dL (ref 4.5–12.0)
TSH: 0.994 u[IU]/mL (ref 0.450–4.500)

## 2018-03-30 ENCOUNTER — Encounter: Payer: Self-pay | Admitting: Family Medicine

## 2018-04-24 NOTE — Progress Notes (Signed)
Patient ID: Shannon [name], female   DOB: 03-13-1966, 52 y.o.   MRN: [id number]           Referring physician: Smith Robert, [place]  Reason for Appointment: Goiter, follow-up visit    History of Present Illness:   She was first seen in 08/2016 for her goiter  The patient's thyroid enlargement was first discovered in 2011, probably on a routine physical exam She initially was evaluated by a thyroid ultrasound and also at that time she had needle aspiration biopsy on the dominant nodules on each side, Previous biopsy of right-sided nodule showed predominantly microfollicular pattern.  Since she had a low normal TSH and a nuclear scan was done in 2018 which showed small areas of warm and cold activity without any dominant focus  RECENT history:  Her PCP has referred her for follow-up Records from prior evaluation and management by PCP, labs and other studies were reviewed again in detail  Again she does have some sensation of pressure and tightness in her lower neck and occasional choking while eating or drinking She is concerned that she may have some difficulty breathing at night but she does have sleep apnea No recent difficulty swallowing  Although she has no history of palpitations or rapid heartbeat her TSH was low normal and 4/19 and apparently her PCP has started her on methimazole 5 mg twice daily since then She has not felt any different with this and her labs has been more recently normal  Lab Results  Component Value Date   FREET4 0.79 08/28/2016   FREET4 1.0 06/29/2016   TSH 0.994 03/24/2018   TSH 0.826 11/08/2017   TSH 0.346 (L) 09/22/2017    She has had an ultrasound exam in 07/2016 with the following results:  Single RIGHT solid nodule measuring 3.0 cm; Other 2 dimensions: 1.9 cm x 2.3 cm) and single LEFT (designated #3) thyroid nodule meet criteria for biopsy, as designated by the newly established ACR TI-RADS criteria, and referral for biopsy is recommended.  The  nodules show increase in size compared to the ultrasound in 2014, at that time the left-sided nodule measured 3 cm x 2.5 cm and now this measures 3.4 cm x 3.3 cm.  Previous management of the right-sided nodule was 2.1 x 1.9 cm  Thyroid biopsy in 08/2009  THYROID, FINE NEEDLE ASPIRATION, RIGHT: Follicular epithelium in a predominantly microfollicular pattern. Based on this material, a follicular neoplasm can not be entirely ruled out.  THYROID,FINE NEEDLE ASPIRATON,LEFT: Benign. Findings consistent with a non-neoplastic goiter.   Allergies as of 04/25/2018      Reactions   Shellfish Allergy Anaphylaxis   Shrimp      Medication List        Accurate as of 04/25/18  3:37 PM. Always use your most recent med list.          benazepril 10 MG tablet Commonly known as:  LOTENSIN TAKE 1 TABLET(10 MG) BY MOUTH DAILY   emtricitabine-rilpivir-tenofovir AF 200-25-25 MG Tabs tablet Commonly known as:  ODEFSEY Take 1 tablet by mouth daily with breakfast.   FLUoxetine 20 MG capsule Commonly known as:  PROZAC Take 1 capsule (20 mg total) by mouth daily.   gabapentin 100 MG capsule Commonly known as:  NEURONTIN Take 1 capsule (100 mg total) by mouth at bedtime.   levothyroxine 25 MCG tablet Commonly known as:  SYNTHROID, LEVOTHROID Take 25 mcg by mouth daily before breakfast.   methimazole 5 MG tablet Commonly known as:  TAPAZOLE  TAKE 1 TABLET(5 MG) BY MOUTH TWICE DAILY   Pitavastatin Calcium 4 MG Tabs Take 4 mg by mouth daily. This is study provided medication. Do not fill prescription. May be placebo. Please check with Research team before prescribing other lipid lowering drugs       Allergies:  Allergies  Allergen Reactions  . Shellfish Allergy Anaphylaxis    Shrimp     Past Medical History:  Diagnosis Date  . Arnold-Chiari malformation ([place])   . HIV disease ([place])   . Hypertension   . Multiple thyroid nodules    Fully functioning thyroid with no treatment  indicated    Past Surgical History:  Procedure Laterality Date  . ABDOMINAL HYSTERECTOMY    . BRAIN SURGERY    . CHOLECYSTECTOMY      Family History  Problem Relation Age of Onset  . Asthma Mother   . Hypertension Mother   . Diabetes Mother   . Diabetes Father   . Cancer Father        colon at 51 yo  . Breast cancer Neg Hx     Social History:  reports that she has been smoking cigarettes. She has a 12.50 pack-year smoking history. She has never used smokeless tobacco. She reports that she drinks about 1.0 standard drinks of alcohol per week. She reports that she does not use drugs.    Review of Systems   She has had hypertension treated by her PCP with variable control  BP Readings from Last 3 Encounters:  04/25/18 (!) 138/94  03/24/18 (!) 142/80  02/17/18 130/90   Has had some recent weight gain although she had lost previously She says that she is watching her portions, fat intake and not able to lose weight recently  Wt Readings from Last 3 Encounters:  04/25/18 217 lb (98.4 kg)  03/24/18 216 lb (98 kg)  02/17/18 213 lb 4 oz (96.7 kg)    Examination:   BP (!) 138/94   Pulse 72   Ht 5\' 2"  (1.575 m)   Wt 217 lb (98.4 kg)   SpO2 98%   BMI 39.69 kg/m   No proptosis of the eyes  The thyroid is enlarged as follows Right lobe is better palpated on swallowing and mostly in the lateral lobe, but 2-2.5 times normal Left lobe is smooth and enlarged about 2.5-3 times, more so laterally also No distinct nodules are felt Neck circumference over the thyroid is 42.5 cm  There is no stridor. Pemberton sign is positive with the patient experiences choking on raising her hands over her head There is no lymphadenopathy.     Biceps reflexes appear normal  Assessment/Plan:  Multinodular goiter, long-standing She does have minor local pressure symptoms in her neck and has a positive Pemberton sign  Largest nodule 3.4 cm on the left previously She has had biopsy on  the nodules but this was done in 2011 and showed  microfollicular pattern on the right side  On exam her nodules and thyroid size appears to be same although neck circumference is larger, this may be related to her weight gain  For further evaluation she agrees to do an ultrasound since she did not have any hot nodules on previous nuclear scan However she is not very keen on surgical removal of the thyroid at this point  Abnormal TSH: Discussed that this was only mild and not associated with high T4 and T3 levels and she does not need to be on methimazole which needs to  be stopped now This may possibly be causing some indirect weight gain also which she is concerned about  HYPERTENSION: Needs follow-up with PCP However may consider nephrology consultation with her difficulty with control   Total visit time for evaluation and management of multiple problems and counseling =25 minutes  Elayne Snare 04/25/2018

## 2018-04-25 ENCOUNTER — Encounter: Payer: Self-pay | Admitting: Endocrinology

## 2018-04-25 ENCOUNTER — Ambulatory Visit: Payer: 59 | Admitting: Endocrinology

## 2018-04-25 VITALS — BP 138/94 | HR 72 | Ht 62.0 in | Wt 217.0 lb

## 2018-04-25 DIAGNOSIS — E042 Nontoxic multinodular goiter: Secondary | ICD-10-CM

## 2018-04-25 DIAGNOSIS — R7989 Other specified abnormal findings of blood chemistry: Secondary | ICD-10-CM

## 2018-04-25 NOTE — Patient Instructions (Signed)
Stop Methimazole 

## 2018-04-28 ENCOUNTER — Other Ambulatory Visit: Payer: Self-pay

## 2018-05-09 ENCOUNTER — Other Ambulatory Visit: Payer: 59

## 2018-05-12 ENCOUNTER — Inpatient Hospital Stay: Admission: RE | Admit: 2018-05-12 | Payer: Self-pay | Source: Ambulatory Visit

## 2018-05-12 ENCOUNTER — Other Ambulatory Visit: Payer: 59

## 2018-05-13 ENCOUNTER — Other Ambulatory Visit: Payer: Self-pay

## 2018-05-23 ENCOUNTER — Ambulatory Visit: Payer: Self-pay | Admitting: Infectious Diseases

## 2018-05-24 ENCOUNTER — Ambulatory Visit (HOSPITAL_COMMUNITY): Payer: 59 | Admitting: Psychiatry

## 2018-06-09 ENCOUNTER — Other Ambulatory Visit: Payer: Self-pay | Admitting: *Deleted

## 2018-06-14 ENCOUNTER — Ambulatory Visit: Payer: 59 | Admitting: Infectious Diseases

## 2018-06-14 ENCOUNTER — Encounter: Payer: 59 | Admitting: *Deleted

## 2018-06-21 ENCOUNTER — Encounter: Payer: Self-pay | Admitting: *Deleted

## 2018-06-21 VITALS — BP 159/109 | HR 79 | Temp 98.0°F | Wt 216.2 lb

## 2018-06-21 DIAGNOSIS — Z006 Encounter for examination for normal comparison and control in clinical research program: Secondary | ICD-10-CM

## 2018-06-22 NOTE — Research (Signed)
Shannon [name] came in for her month 8 Reprieve visit. She is taking Odefsey and study drug daily. She reports no medication changes, no muscle weakness, and grade one muscle aches due to occassional thigh cramping. She will return for her month 60 visit on 5/5.

## 2018-06-23 ENCOUNTER — Encounter: Payer: Self-pay | Admitting: Family Medicine

## 2018-06-23 ENCOUNTER — Ambulatory Visit (INDEPENDENT_AMBULATORY_CARE_PROVIDER_SITE_OTHER): Payer: Self-pay | Admitting: Family Medicine

## 2018-06-23 VITALS — BP 139/84 | HR 81 | Temp 98.9°F | Resp 16 | Ht 62.0 in | Wt 217.0 lb

## 2018-06-23 DIAGNOSIS — I10 Essential (primary) hypertension: Secondary | ICD-10-CM

## 2018-06-23 LAB — POCT URINALYSIS DIPSTICK
Bilirubin, UA: NEGATIVE
Blood, UA: NEGATIVE
Glucose, UA: NEGATIVE
Ketones, UA: NEGATIVE
Leukocytes, UA: NEGATIVE
Nitrite, UA: NEGATIVE
Protein, UA: NEGATIVE
Spec Grav, UA: 1.015 (ref 1.010–1.025)
Urobilinogen, UA: 0.2 E.U./dL
pH, UA: 5.5 (ref 5.0–8.0)

## 2018-06-23 MED ORDER — BENAZEPRIL HCL 10 MG PO TABS: ORAL_TABLET | ORAL | 2 refills | Status: DC

## 2018-06-23 NOTE — Patient Instructions (Signed)
You are looking good! I will see 10 pounds less of you in 3 months.    Hypertension Hypertension is another name for high blood pressure. High blood pressure forces your heart to work harder to pump blood. This can cause problems over time. There are two numbers in a blood pressure reading. There is a top number (systolic) over a bottom number (diastolic). It is best to have a blood pressure below 120/80. Healthy choices can help lower your blood pressure. You may need medicine to help lower your blood pressure if:  Your blood pressure cannot be lowered with healthy choices.  Your blood pressure is higher than 130/80. Follow these instructions at home: Eating and drinking   If directed, follow the DASH eating plan. This diet includes: ? Filling half of your plate at each meal with fruits and vegetables. ? Filling one quarter of your plate at each meal with whole grains. Whole grains include whole wheat pasta, brown rice, and whole grain bread. ? Eating or drinking low-fat dairy products, such as skim milk or low-fat yogurt. ? Filling one quarter of your plate at each meal with low-fat (lean) proteins. Low-fat proteins include fish, skinless chicken, eggs, beans, and tofu. ? Avoiding fatty meat, cured and processed meat, or chicken with skin. ? Avoiding premade or processed food.  Eat less than 1,500 mg of salt (sodium) a day.  Limit alcohol use to no more than 1 drink a day for nonpregnant women and 2 drinks a day for men. One drink equals 12 oz of beer, 5 oz of wine, or 1 oz of hard liquor. Lifestyle  Work with your doctor to stay at a healthy weight or to lose weight. Ask your doctor what the best weight is for you.  Get at least 30 minutes of exercise that causes your heart to beat faster (aerobic exercise) most days of the week. This may include walking, swimming, or biking.  Get at least 30 minutes of exercise that strengthens your muscles (resistance exercise) at least 3 days a  week. This may include lifting weights or pilates.  Do not use any products that contain nicotine or tobacco. This includes cigarettes and e-cigarettes. If you need help quitting, ask your doctor.  Check your blood pressure at home as told by your doctor.  Keep all follow-up visits as told by your doctor. This is important. Medicines  Take over-the-counter and prescription medicines only as told by your doctor. Follow directions carefully.  Do not skip doses of blood pressure medicine. The medicine does not work as well if you skip doses. Skipping doses also puts you at risk for problems.  Ask your doctor about side effects or reactions to medicines that you should watch for. Contact a doctor if:  You think you are having a reaction to the medicine you are taking.  You have headaches that keep coming back (recurring).  You feel dizzy.  You have swelling in your ankles.  You have trouble with your vision. Get help right away if:  You get a very bad headache.  You start to feel confused.  You feel weak or numb.  You feel faint.  You get very bad pain in your: ? Chest. ? Belly (abdomen).  You throw up (vomit) more than once.  You have trouble breathing. Summary  Hypertension is another name for high blood pressure.  Making healthy choices can help lower blood pressure. If your blood pressure cannot be controlled with healthy choices, you may need  to take medicine. This information is not intended to replace advice given to you by your health care provider. Make sure you discuss any questions you have with your health care provider. Document Released: 11/11/2007 Document Revised: 04/22/2016 Document Reviewed: 04/22/2016 Elsevier Interactive Patient Education  2019 [employer].

## 2018-06-23 NOTE — Progress Notes (Signed)
  Patient [facility]   Progress Note: General Provider: Lanae Boast, FNP  SUBJECTIVE:   Shannon [name] is a 53 y.o. female who  has a past medical history of Arnold-Chiari malformation ([place]), HIV disease ([place]), Hypertension, and Multiple thyroid nodules.. Patient presents today for Hypertension doing well without complaints. No side effects from medications.   Review of Systems  Constitutional: Negative.   HENT: Negative.   Eyes: Negative.   Respiratory: Negative.   Cardiovascular: Negative.   Gastrointestinal: Negative.   Genitourinary: Negative.   Musculoskeletal: Negative.   Skin: Negative.   Neurological: Negative.   Psychiatric/Behavioral: Negative.      OBJECTIVE: BP 139/84 (BP Location: Left Arm, Patient Position: Sitting, Cuff Size: Large)   Pulse 81   Temp 98.9 F (37.2 C) (Oral)   Resp 16   Ht 5\' 2"  (1.575 m)   Wt 217 lb (98.4 kg)   SpO2 98%   BMI 39.69 kg/m   Wt Readings from Last 3 Encounters:  06/23/18 217 lb (98.4 kg)  06/21/18 216 lb 4 oz (98.1 kg)  04/25/18 217 lb (98.4 kg)     Physical Exam Vitals signs and nursing note reviewed.  Constitutional:      General: She is not in acute distress.    Appearance: She is well-developed.  HENT:     Head: Normocephalic and atraumatic.  Eyes:     Conjunctiva/sclera: Conjunctivae normal.     Pupils: Pupils are equal, round, and reactive to light.  Neck:     Musculoskeletal: Normal range of motion.  Cardiovascular:     Rate and Rhythm: Normal rate and regular rhythm.     Heart sounds: Normal heart sounds.  Pulmonary:     Effort: Pulmonary effort is normal. No respiratory distress.     Breath sounds: Normal breath sounds.  Abdominal:     General: Bowel sounds are normal. There is no distension.     Palpations: Abdomen is soft.  Musculoskeletal: Normal range of motion.  Skin:    General: Skin is warm and dry.  Neurological:     Mental Status: She is  alert and oriented to person, place, and time.  Psychiatric:        Behavior: Behavior normal.        Thought Content: Thought content normal.     ASSESSMENT/PLAN: 1. Essential hypertension No medication changes warranted at the present time.   - Urinalysis Dipstick - benazepril (LOTENSIN) 10 MG tablet; TAKE 1 TABLET(10 MG) BY MOUTH DAILY  Dispense: 90 tablet; Refill: 2    Return in about 3 months (around 09/22/2018) for HTn.    The patient was given clear instructions to go to ER or return to medical center if symptoms do not improve, worsen or new problems develop. The patient verbalized understanding and agreed with plan of care.   [name], FNP-BC Patient [facility] [address] [place], [place] [address] [phone]

## 2018-07-15 ENCOUNTER — Ambulatory Visit: Payer: Self-pay | Admitting: Infectious Diseases

## 2018-07-15 ENCOUNTER — Ambulatory Visit: Payer: Self-pay | Admitting: Licensed Clinical Social Worker

## 2018-07-25 ENCOUNTER — Other Ambulatory Visit: Payer: Self-pay

## 2018-07-28 ENCOUNTER — Ambulatory Visit: Payer: Self-pay | Admitting: Endocrinology

## 2018-09-22 ENCOUNTER — Ambulatory Visit: Payer: Self-pay | Admitting: Family Medicine

## 2018-10-04 ENCOUNTER — Emergency Department (HOSPITAL_BASED_OUTPATIENT_CLINIC_OR_DEPARTMENT_OTHER): Payer: Managed Care, Other (non HMO)

## 2018-10-04 ENCOUNTER — Encounter (HOSPITAL_BASED_OUTPATIENT_CLINIC_OR_DEPARTMENT_OTHER): Payer: Self-pay | Admitting: Adult Health

## 2018-10-04 ENCOUNTER — Other Ambulatory Visit: Payer: Self-pay

## 2018-10-04 ENCOUNTER — Telehealth: Payer: Self-pay | Admitting: Physician Assistant

## 2018-10-04 ENCOUNTER — Observation Stay (HOSPITAL_BASED_OUTPATIENT_CLINIC_OR_DEPARTMENT_OTHER)
Admission: EM | Admit: 2018-10-04 | Discharge: 2018-10-05 | Disposition: A | Discharge status: Home / Self Care | Payer: Managed Care, Other (non HMO) | Attending: Family Medicine | Admitting: Family Medicine

## 2018-10-04 ENCOUNTER — Telehealth: Payer: Self-pay

## 2018-10-04 DIAGNOSIS — E669 Obesity, unspecified: Secondary | ICD-10-CM | POA: Insufficient documentation

## 2018-10-04 DIAGNOSIS — Z21 Asymptomatic human immunodeficiency virus [HIV] infection status: Secondary | ICD-10-CM | POA: Insufficient documentation

## 2018-10-04 DIAGNOSIS — Z79890 Hormone replacement therapy (postmenopausal): Secondary | ICD-10-CM | POA: Insufficient documentation

## 2018-10-04 DIAGNOSIS — I208 Other forms of angina pectoris: Secondary | ICD-10-CM

## 2018-10-04 DIAGNOSIS — Z79899 Other long term (current) drug therapy: Secondary | ICD-10-CM | POA: Diagnosis not present

## 2018-10-04 DIAGNOSIS — E785 Hyperlipidemia, unspecified: Secondary | ICD-10-CM | POA: Insufficient documentation

## 2018-10-04 DIAGNOSIS — Z20828 Contact with and (suspected) exposure to other viral communicable diseases: Secondary | ICD-10-CM | POA: Insufficient documentation

## 2018-10-04 DIAGNOSIS — E0789 Other specified disorders of thyroid: Secondary | ICD-10-CM | POA: Diagnosis not present

## 2018-10-04 DIAGNOSIS — F17210 Nicotine dependence, cigarettes, uncomplicated: Secondary | ICD-10-CM | POA: Insufficient documentation

## 2018-10-04 DIAGNOSIS — R0602 Shortness of breath: Secondary | ICD-10-CM

## 2018-10-04 DIAGNOSIS — E039 Hypothyroidism, unspecified: Secondary | ICD-10-CM | POA: Insufficient documentation

## 2018-10-04 DIAGNOSIS — I10 Essential (primary) hypertension: Secondary | ICD-10-CM | POA: Diagnosis not present

## 2018-10-04 DIAGNOSIS — R0789 Other chest pain: Secondary | ICD-10-CM | POA: Diagnosis not present

## 2018-10-04 DIAGNOSIS — Z86718 Personal history of other venous thrombosis and embolism: Secondary | ICD-10-CM | POA: Diagnosis not present

## 2018-10-04 DIAGNOSIS — F329 Major depressive disorder, single episode, unspecified: Secondary | ICD-10-CM | POA: Diagnosis not present

## 2018-10-04 DIAGNOSIS — R079 Chest pain, unspecified: Secondary | ICD-10-CM | POA: Diagnosis present

## 2018-10-04 DIAGNOSIS — M7989 Other specified soft tissue disorders: Secondary | ICD-10-CM

## 2018-10-04 HISTORY — DX: Acute embolism and thrombosis of unspecified deep veins of unspecified lower extremity: I82.409

## 2018-10-04 LAB — URINALYSIS, ROUTINE W REFLEX MICROSCOPIC
Bilirubin Urine: NEGATIVE
Glucose, UA: NEGATIVE mg/dL
Hgb urine dipstick: NEGATIVE
Ketones, ur: NEGATIVE mg/dL
Leukocytes,Ua: NEGATIVE
Nitrite: NEGATIVE
Protein, ur: NEGATIVE mg/dL
Specific Gravity, Urine: <1.005 — ABNORMAL LOW (ref 1.005–1.030)
pH: 6.5 (ref 5.0–8.0)

## 2018-10-04 LAB — CBC WITH DIFFERENTIAL/PLATELET
Abs Immature Granulocytes: 0.01 10*3/uL (ref 0.00–0.07)
Basophils Absolute: 0 10*3/uL (ref 0.0–0.1)
Basophils Relative: 1 %
Eosinophils Absolute: 0.1 10*3/uL (ref 0.0–0.5)
Eosinophils Relative: 1 %
HCT: 41.9 % (ref 36.0–46.0)
Hemoglobin: 13.7 g/dL (ref 12.0–15.0)
Immature Granulocytes: 0 %
Lymphocytes Relative: 44 %
Lymphs Abs: 3.3 10*3/uL (ref 0.7–4.0)
MCH: 28.2 pg (ref 26.0–34.0)
MCHC: 32.7 g/dL (ref 30.0–36.0)
MCV: 86.4 fL (ref 80.0–100.0)
Monocytes Absolute: 0.7 10*3/uL (ref 0.1–1.0)
Monocytes Relative: 10 %
Neutro Abs: 3.2 10*3/uL (ref 1.7–7.7)
Neutrophils Relative %: 44 %
Platelets: 219 10*3/uL (ref 150–400)
RBC: 4.85 MIL/uL (ref 3.87–5.11)
RDW: 15.6 % — ABNORMAL HIGH (ref 11.5–15.5)
WBC: 7.4 10*3/uL (ref 4.0–10.5)
nRBC: 0 % (ref 0.0–0.2)

## 2018-10-04 LAB — TROPONIN I
Troponin I: <0.03 ng/mL (ref <0.03)
Troponin I: <0.03 ng/mL (ref <0.03)

## 2018-10-04 LAB — COMPREHENSIVE METABOLIC PANEL
ALT: 18 U/L (ref 0–44)
AST: 16 U/L (ref 15–41)
Albumin: 3.7 g/dL (ref 3.5–5.0)
Alkaline Phosphatase: 70 U/L (ref 38–126)
Anion gap: 5 (ref 5–15)
BUN: 10 mg/dL (ref 6–20)
CO2: 24 mmol/L (ref 22–32)
Calcium: 8.9 mg/dL (ref 8.9–10.3)
Chloride: 110 mmol/L (ref 98–111)
Creatinine, Ser: 0.94 mg/dL (ref 0.44–1.00)
GFR calc Af Amer: >60 mL/min (ref >60)
GFR calc non Af Amer: >60 mL/min (ref >60)
Glucose, Bld: 132 mg/dL — ABNORMAL HIGH (ref 70–99)
Potassium: 3.9 mmol/L (ref 3.5–5.1)
Sodium: 139 mmol/L (ref 135–145)
Total Bilirubin: 0.5 mg/dL (ref 0.3–1.2)
Total Protein: 7 g/dL (ref 6.5–8.1)

## 2018-10-04 LAB — SARS CORONAVIRUS 2 BY RT PCR (HOSPITAL ORDER, PERFORMED IN ~~LOC~~ HOSPITAL LAB): SARS Coronavirus 2: NEGATIVE

## 2018-10-04 MED ORDER — GABAPENTIN 100 MG PO CAPS
100.0000 mg | ORAL_CAPSULE | Freq: Every day | ORAL | Status: DC
  Administered 2018-10-04: 100 mg via ORAL
  Filled 2018-10-04: qty 1

## 2018-10-04 MED ORDER — BENAZEPRIL HCL 5 MG PO TABS
10.0000 mg | ORAL_TABLET | Freq: Every day | ORAL | Status: DC
  Administered 2018-10-05: 10 mg via ORAL
  Filled 2018-10-04: qty 2

## 2018-10-04 MED ORDER — SODIUM CHLORIDE 0.9% FLUSH
3.0000 mL | Freq: Once | INTRAVENOUS | Status: DC
  Filled 2018-10-04: qty 3

## 2018-10-04 MED ORDER — LEVOTHYROXINE SODIUM 25 MCG PO TABS
25.0000 ug | ORAL_TABLET | Freq: Every day | ORAL | Status: DC
  Filled 2018-10-04: qty 1

## 2018-10-04 MED ORDER — SODIUM CHLORIDE 0.9% FLUSH
3.0000 mL | Freq: Two times a day (BID) | INTRAVENOUS | Status: DC
  Administered 2018-10-04 – 2018-10-05 (×2): 3 mL via INTRAVENOUS

## 2018-10-04 MED ORDER — SODIUM CHLORIDE 0.9% FLUSH: 3.0000 mL | INTRAVENOUS | Status: DC | PRN

## 2018-10-04 MED ORDER — SODIUM CHLORIDE 0.9 % IV SOLN: 250.0000 mL | INTRAVENOUS | Status: DC | PRN

## 2018-10-04 MED ORDER — ONDANSETRON HCL 4 MG PO TABS: 4.0000 mg | ORAL_TABLET | Freq: Four times a day (QID) | ORAL | Status: DC | PRN

## 2018-10-04 MED ORDER — HYDROCODONE-ACETAMINOPHEN 5-325 MG PO TABS: 1.0000 | ORAL_TABLET | ORAL | Status: DC | PRN

## 2018-10-04 MED ORDER — FLUOXETINE HCL 20 MG PO CAPS
20.0000 mg | ORAL_CAPSULE | Freq: Every day | ORAL | Status: DC
  Administered 2018-10-05: 20 mg via ORAL
  Filled 2018-10-04: qty 1

## 2018-10-04 MED ORDER — SODIUM CHLORIDE 0.9 % IV BOLUS
1000.0000 mL | Freq: Once | INTRAVENOUS | Status: AC
  Administered 2018-10-04: 1000 mL via INTRAVENOUS

## 2018-10-04 MED ORDER — ENOXAPARIN SODIUM 40 MG/0.4ML ~~LOC~~ SOLN
40.0000 mg | SUBCUTANEOUS | Status: DC
  Administered 2018-10-04: 40 mg via SUBCUTANEOUS
  Filled 2018-10-04: qty 0.4

## 2018-10-04 MED ORDER — ONDANSETRON HCL 4 MG/2ML IJ SOLN: 4.0000 mg | Freq: Four times a day (QID) | INTRAMUSCULAR | Status: DC | PRN

## 2018-10-04 MED ORDER — ACETAMINOPHEN 325 MG PO TABS: 650.0000 mg | ORAL_TABLET | Freq: Four times a day (QID) | ORAL | Status: DC | PRN

## 2018-10-04 MED ORDER — METHIMAZOLE 5 MG PO TABS
5.0000 mg | ORAL_TABLET | Freq: Two times a day (BID) | ORAL | Status: DC
  Filled 2018-10-04 (×2): qty 1

## 2018-10-04 MED ORDER — ASPIRIN 81 MG PO CHEW
324.0000 mg | CHEWABLE_TABLET | Freq: Once | ORAL | Status: AC
  Administered 2018-10-04: 324 mg via ORAL
  Filled 2018-10-04: qty 4

## 2018-10-04 MED ORDER — ACETAMINOPHEN 650 MG RE SUPP: 650.0000 mg | Freq: Four times a day (QID) | RECTAL | Status: DC | PRN

## 2018-10-04 MED ORDER — EMTRICITAB-RILPIVIR-TENOFOV AF 200-25-25 MG PO TABS
1.0000 | ORAL_TABLET | Freq: Every day | ORAL | Status: DC
  Administered 2018-10-05: 1 via ORAL
  Filled 2018-10-04 (×2): qty 1

## 2018-10-04 MED ORDER — IOHEXOL 350 MG/ML SOLN
100.0000 mL | Freq: Once | INTRAVENOUS | Status: AC | PRN
  Administered 2018-10-04: 100 mL via INTRAVENOUS

## 2018-10-04 NOTE — ED Notes (Signed)
Pt lying flat for orthostatic VS 
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
## 2018-10-04 NOTE — ED Triage Notes (Signed)
Presents with productive cough, chest heaviness, bilateral shoulder pain and L knee swelling. She has a HX of DVT, this does not feel the same. The symptoms began Friday and have worsened. Endorses SOB.

## 2018-10-04 NOTE — ED Notes (Signed)
Assisted pt to [facility].

## 2018-10-04 NOTE — H&P (Signed)
[facility]                                                                                    Patient Demographics  Shannon [name], is a 53 y.o. female  CSN: [id number]  MRN: [id number]  DOB - 11/27/1965  Admit Date - 10/04/2018  Outpatient Primary MD for the patient is Lanae Boast, [place]   With History of -  Past Medical History:  Diagnosis Date  . Arnold-Chiari malformation ([place])   . DVT (deep venous thrombosis) ([place])   . HIV disease ([place])   . Hypertension   . Multiple thyroid nodules    Fully functioning thyroid with no treatment indicated      Past Surgical History:  Procedure Laterality Date  . ABDOMINAL HYSTERECTOMY    . BRAIN SURGERY    . CHOLECYSTECTOMY      in for   Chief Complaint  Patient presents with  . Chest Pain     HPI  Shannon [name]  is a 53 y.o. female, with past medical history significant for HIV disease, hypertension, and hyperlipidemia for evaluation of chest pain that has been on and off for the last 2 weeks, last episode around 6 hours prior to presentation and is described as pressure, nonradiating, no associated nausea or vomiting, but mild shortness of breath .  Patient denies any history of fever or chills.    Review of Systems    In addition to the HPI above,  No Fever-chills, No Headache, No changes with Vision or hearing, No problems swallowing food or Liquids, No Chest pain, Cough or Shortness of Breath, No Abdominal pain, No Nausea or Vommitting, Bowel movements are regular, No Blood in stool or Urine, No dysuria, No new skin rashes or bruises, No new joints pains-aches,  No new weakness, tingling, numbness in any extremity, No recent weight gain or loss, No polyuria, polydypsia or polyphagia, No significant Mental Stressors.  A full 10 point Review of Systems was done, except as stated above, all other Review of Systems were negative.   Social History Social History   Tobacco Use  .  Smoking status: Current Every Day Smoker    Packs/day: 0.50    Years: 25.00    Pack years: 12.50    Types: Cigarettes    Last attempt to quit: 03/24/2017    Years since quitting: 1.5  . Smokeless tobacco: Never Used  . Tobacco comment: Patient trying to quit  Substance Use Topics  . Alcohol use: Yes    Alcohol/week: 1.0 standard drinks    Types: 1 Standard drinks or equivalent per week    Comment: rare     Family History Family History  Problem Relation Age of Onset  . Asthma Mother   . Hypertension Mother   . Diabetes Mother   . Diabetes Father   . Cancer Father        colon at 42 yo  . Breast cancer Neg Hx      Prior to Admission medications   Medication Sig Start Date End Date Taking? Authorizing Provider  benazepril (LOTENSIN) 10 MG tablet TAKE 1 TABLET(10 MG) BY MOUTH DAILY 06/23/18  Lanae Boast, FNP  emtricitabine-rilpivir-tenofovir AF (ODEFSEY) 200-25-25 MG TABS tablet Take 1 tablet by mouth daily with breakfast. 11/08/17   Campbell Riches, MD  FLUoxetine (PROZAC) 20 MG capsule Take 1 capsule (20 mg total) by mouth daily. 03/08/18   Arfeen, Arlyce Harman, MD  gabapentin (NEURONTIN) 100 MG capsule Take 1 capsule (100 mg total) by mouth at bedtime. Patient not taking: Reported on 06/23/2018 09/22/17   Dorena Dew, FNP  levothyroxine (SYNTHROID, LEVOTHROID) 25 MCG tablet Take 25 mcg by mouth daily before breakfast.    [provider]  methimazole (TAPAZOLE) 5 MG tablet TAKE 1 TABLET(5 MG) BY MOUTH TWICE DAILY Patient not taking: Reported on 06/23/2018 02/22/18   Lanae Boast, FNP    Allergies  Allergen Reactions  . Shellfish Allergy Anaphylaxis    Shrimp     Physical Exam  Vitals  Blood pressure 129/87, pulse 71, temperature 98.5 F (36.9 C), temperature source Oral, resp. rate (!) 21, height 5\' 3"  (1.6 m), weight 95.3 kg, SpO2 97 %.   1. General very pleasant, no acute distress  2. Normal affect and insight, Not Suicidal or Homicidal, Awake  Alert, Oriented X 3.  3. No F.N deficits, grossly..  4. Ears and Eyes appear Normal grossly.  5. Supple Neck, No JVD, No cervical lymphadenopathy appriciated, No Carotid Bruits.  6. Symmetrical Chest wall movement, Good air movement bilaterally, CTAB.  7. RRR, No Gallops, Rubs or Murmurs, No Parasternal Heave.  8. Positive Bowel Sounds, Abdomen Soft, Non tender, No organomegaly appriciated,No rebound -guarding or rigidity.  9.  No Cyanosis, Normal Skin Turgor, No Skin Rash or Bruise.  10. Good muscle tone,  joints appear normal , no effusions, Normal ROM.    Data Review  CBC Recent Labs  Lab 10/04/18 1201  WBC 7.4  HGB 13.7  HCT 41.9  PLT 219  MCV 86.4  MCH 28.2  MCHC 32.7  RDW 15.6*  LYMPHSABS 3.3  MONOABS 0.7  EOSABS 0.1  BASOSABS 0.0   ------------------------------------------------------------------------------------------------------------------  Chemistries  Recent Labs  Lab 10/04/18 1201  NA 139  K 3.9  CL 110  CO2 24  GLUCOSE 132*  BUN 10  CREATININE 0.94  CALCIUM 8.9  AST 16  ALT 18  ALKPHOS 70  BILITOT 0.5   ------------------------------------------------------------------------------------------------------------------ estimated creatinine clearance is 75.2 mL/min (by C-G formula based on SCr of 0.94 mg/dL). ------------------------------------------------------------------------------------------------------------------ No results for input(s): TSH, T4TOTAL, T3FREE, THYROIDAB in the last 72 hours.  Invalid input(s): FREET3   Coagulation profile No results for input(s): INR, PROTIME in the last 168 hours. ------------------------------------------------------------------------------------------------------------------- No results for input(s): DDIMER in the last 72 hours. -------------------------------------------------------------------------------------------------------------------  Cardiac Enzymes Recent Labs  Lab  10/04/18 1201  TROPONINI <0.03   ------------------------------------------------------------------------------------------------------------------ Invalid input(s): POCBNP   ---------------------------------------------------------------------------------------------------------------  Urinalysis    Component Value Date/Time   COLORURINE YELLOW 10/04/2018 1505   APPEARANCEUR CLEAR 10/04/2018 1505   LABSPEC <1.005 (L) 10/04/2018 1505   PHURINE 6.5 10/04/2018 1505   GLUCOSEU NEGATIVE 10/04/2018 1505   GLUCOSEU NEG mg/dL 04/26/2008 1545   HGBUR NEGATIVE 10/04/2018 1505   BILIRUBINUR NEGATIVE 10/04/2018 1505   BILIRUBINUR neg 06/23/2018 1616   KETONESUR NEGATIVE 10/04/2018 1505   PROTEINUR NEGATIVE 10/04/2018 1505   UROBILINOGEN 0.2 06/23/2018 1616   UROBILINOGEN 0.2 03/24/2017 1453   NITRITE NEGATIVE 10/04/2018 1505   LEUKOCYTESUR NEGATIVE 10/04/2018 1505    ----------------------------------------------------------------------------------------------------------------     Imaging results:   Dg Chest 2 View  Result Date: 10/04/2018 CLINICAL DATA:  Shortness  of breath and chest heaviness EXAM: CHEST - 2 VIEW COMPARISON:  August 26, 2017 FINDINGS: No edema or consolidation. Heart size and pulmonary vascularity are normal. No adenopathy. No bone lesions. IMPRESSION: No edema or consolidation. Electronically Signed   By: Lowella Grip III M.D.   On: 10/04/2018 11:53   Ct Angio Chest Pe W And/or Wo Contrast  Result Date: 10/04/2018 CLINICAL DATA:  Chest pain and cough. History of deep venous thrombosis EXAM: CT ANGIOGRAPHY CHEST WITH CONTRAST TECHNIQUE: Multidetector CT imaging of the chest was performed using the standard protocol during bolus administration of intravenous contrast. Multiplanar CT image reconstructions and MIPs were obtained to evaluate the vascular anatomy. CONTRAST:  173mL OMNIPAQUE IOHEXOL 350 MG/ML SOLN COMPARISON:  October 04, 2018 chest radiograph  FINDINGS: Cardiovascular: There is no demonstrable pulmonary embolus. There is no evident thoracic aortic aneurysm or dissection. The visualized great vessels appear unremarkable. There is no pericardial effusion or pericardial thickening. Mediastinum/Nodes: There is diffuse thyroid enlargement with multiple nodular lesions throughout the thyroid. There is a dominant mass in the left lobe of the thyroid measuring 1.8 x 1.8 cm. There is no appreciable thoracic adenopathy. No esophageal lesions are evident. Lungs/Pleura: There is no edema or consolidation. There is mosaic attenuation throughout portions of the lungs bilaterally, particularly in the lower lobes, likely due to underlying small airways obstructive disease. No pleural effusion or pleural thickening evident. Upper Abdomen: Visualized upper abdominal structures appear unremarkable. Musculoskeletal: There is degenerative change in the thoracic spine. There are no blastic or lytic bone lesions. No intramuscular lesions are evident. Review of the MIP images confirms the above findings. IMPRESSION: 1. No demonstrable pulmonary embolus. No thoracic aortic aneurysm or dissection. 2. Diffuse thyroid enlargement. Dominant mass left lobe thyroid. This finding may warrant nonemergent thyroid ultrasound to further evaluate. 3. Mosaic attenuation in portions of the lungs, likely indicative of a degree of small airways obstructive disease. No edema or consolidation. No well-defined ground-glass opacity. No pleural effusions. 4.  No evident thoracic adenopathy. Electronically Signed   By: Lowella Grip III M.D.   On: 10/04/2018 12:58   Dg Knee Complete 4 Views Left  Result Date: 10/04/2018 CLINICAL DATA:  53 year old female with a history of 2 weeks left knee pain EXAM: LEFT KNEE - COMPLETE 4+ VIEW COMPARISON:  03/05/2010 FINDINGS: No acute displaced fracture. Medial greater than lateral joint space narrowing with marginal osteophyte formation. Degenerative  changes of the patellofemoral joint. No joint effusion. No radiopaque foreign body. IMPRESSION: Negative for acute bony abnormality. Tricompartmental osteoarthritis Electronically Signed   By: Corrie Mckusick D.O.   On: 10/04/2018 11:56    My personal review of EKG: Rhythm NSR, Rate 88 bpm with no acute changes    Assessment & Plan  Chest pain Serial troponins Check COVID-19 and follow  Hypertension Continue with Lotensin  History of HIV Not on treatment  DVT Prophylaxis Lovenox  AM Labs Ordered, also please review Full Orders    Code Status full  Disposition Plan: Home  Time spent in minutes : 43 minutes  Condition GUARDED   @SIGNATURE @

## 2018-10-04 NOTE — ED Notes (Signed)
Patient transported to X-ray 

## 2018-10-04 NOTE — Progress Notes (Signed)
Transfer from [facility]. Shannon [name] is a 53 year old female with pmh HTN, HLD, HIV, DVT and obesity; who presented with complaints of chest pain worsened with exertion. 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  CT angiogram of the chest showed no signs of a pulmonary embolus, but did reveal thyroid enlargement with 1.8 cm.  Heart score approximately 5.  TR[facility] called to admit for chest pain rule out.  Transferring to a telemetry bed as observation.  Patient may need to be ruled out for COVID.

## 2018-10-04 NOTE — Telephone Encounter (Signed)
Patient called and states she is having "heavyness" on mid chest, shoulder pain, and Knee pain. She denies sob or difficulty breathing. I advised that due to the chest pain and shoulder pain she should be evaluated in ER. She verbalized understanding.

## 2018-10-04 NOTE — ED Notes (Signed)
Report to Joellen Jersey, Therapist, sports at [facility]

## 2018-10-04 NOTE — Progress Notes (Signed)
E-Visit for [employer] Virus Screening  Based on what you have shared with me, you need to seek an evaluation for a severe illness that is causing your symptoms which may be coronavirus or some other illness. I recommend that you be seen and evaluated "face to face". Our Emergency Departments are best equipped to handle patients with severe symptoms.   I recommend the following:  If you are having a true medical emergency please call 911. If you are considered high risk for Corona virus because of a known exposure, fever, shortness of breath and cough, OR if you have severe symptoms of any kind, seek medical care at an emergency room.  Please call ahead and tell them that you were seen by telemedicine and they have recommended that you have a face to face evaluation. [facility] Emergency Department [place], [place], [place] [address] [phone]  [facility] Emergency Department [place], [place], [place] [address] [facility] Emergency Department [place], [place], [place] [address] [phone]  [facility] Emergency Department [place], [place], [place] [address] [facility] Emergency Department [place], [place], [place] [address] [phone]  NOTE: If you entered your credit card information for this eVisit, you will not be charged. You may see a "hold" on your card for the $35 but that hold will drop off and you will not have a charge processed.   Your e-visit answers were reviewed by a board certified advanced clinical practitioner to complete your personal care plan.  Thank you for using e-Visits.  ===View-only below this line===   ----- Message -----    From: Shannon [name]    Sent: 10/04/2018  9:07 AM EDT      To: E-Visit Mailing List Subject: E-Visit Submission: CoronaVirus ([id number])  Screening  E-Visit Submission: CoronaVirus ([id number]) Screening --------------------------------  Question: Do you have any of the following?  Answer:   Shortness of breath  Question: If you are experiencing trouble breathing please select the severity of this:  Answer:   I have mild trouble breathing but not very often  Question: Do you have any of the following additional symptoms?  Answer:   Stuffy nose            Body aches            Headache            Leg swelling  Question: Have you had a fever? Answer:   No  Question: Have others in your home or workplace had similar symptoms? Answer:   No  Question: When did your symptoms start? Answer:   Friday 4/24  Question: Have you recently visited any of the following countries? Answer:   None of these  Question: If you have traveled anywhere in the last  2 months please document where you have visited: Answer:   No  Question: Have you recently been around others from these countries or visited these countries who have had coughing or fever? Answer:   No  Question: Have you recently been around anyone who has been diagnosed with Corona virus? Answer:   No  Question: Have you been taking any medications? Answer:   Yes  Question: If taking medications for these symptoms, please list the names and whether they are helping or not Answer:   Tylenol not helpful  Question: Are you  treated for any of the following conditions: Asthma, COPD, Diabetes, Renal Failure (on Dialysis), AIDS, any Neuromuscular disease that effects the clearing of secretions, Heart Failure, or Heart Disease? Answer:   Yes  Question: Please enter a phone number where you can be reached if we have additional questions about your symptoms Answer:   [phone]  Question: Please list your medication allergies that you may have ? (If 'none' , please list as 'none') Answer:   Shellfish  Question: Please list any additional comments  Answer:

## 2018-10-05 LAB — TROPONIN I
Troponin I: <0.03 ng/mL (ref <0.03)
Troponin I: <0.03 ng/mL (ref <0.03)

## 2018-10-05 MED ORDER — BENAZEPRIL HCL 10 MG PO TABS: 10.0000 mg | ORAL_TABLET | Freq: Every day | ORAL | 2 refills | Status: DC

## 2018-10-05 MED ORDER — LEVOTHYROXINE SODIUM 25 MCG PO TABS: 25.0000 ug | ORAL_TABLET | Freq: Every day | ORAL | 3 refills | Status: DC

## 2018-10-05 MED ORDER — ACETAMINOPHEN 325 MG PO TABS: 650.0000 mg | ORAL_TABLET | Freq: Four times a day (QID) | ORAL | 0 refills | Status: DC | PRN

## 2018-10-05 NOTE — Discharge Summary (Signed)
COTINA FREEDMAN, is a 53 y.o. female  DOB 09-29-1965  MRN [id number].  Admission date:  10/04/2018  Admitting Physician  Norval Morton, MD  Discharge Date:  10/05/2018   Primary MD  Lanae Boast, FNP  Recommendations for primary care physician for things to follow:   1) smoking cessation strongly advised 2) follow-up with infectious disease specialist Dr. Johnnye Sima for management of your HIV 3) follow-up to primary care physician for reevaluation if chest pain persist and for possible stress test as an outpatient   Admission Diagnosis  Chest pressure [R07.89]   Discharge Diagnosis  Chest pressure [R07.89]    Active Problems:   Chest pain      Past Medical History:  Diagnosis Date  . Arnold-Chiari malformation ([place])   . DVT (deep venous thrombosis) ([place])   . HIV disease ([place])   . Hypertension   . Multiple thyroid nodules    Fully functioning thyroid with no treatment indicated    Past Surgical History:  Procedure Laterality Date  . ABDOMINAL HYSTERECTOMY    . BRAIN SURGERY    . CHOLECYSTECTOMY         HPI  from the history and physical done on the day of admission:     Yatzary Merriweather  is a 53 y.o. female, with past medical history significant for HIV disease, hypertension, and hyperlipidemia for evaluation of chest pain that has been on and off for the last 2 weeks, last episode around 6 hours prior to presentation and is described as pressure, nonradiating, no associated nausea or vomiting, but mild shortness of breath .  Patient denies any history of fever or chills.    Hospital Course:     1) Atypical chest pain--- cardiovascular risk factors include status post menopause, HLD, HTN, and tobacco abuse--- CTA chest negative for acute findings, patient ruled out for ACS by serial troponins and EKG. chest pain is reproducible with palpation and with positional change---.Marland Kitchen  Cardiovascular risk factor modifications discussed, follow-up as outpatient with PCP for possible stress test  2)HIV-- compliance with ODEFSEY strongly advised, follow-up with ID specialist Dr. Johnnye Sima advised  3)HTN--- stable, low-salt diet advised, continue benazepril  4)Hypothyroidism----continue levothyroxine,   5)Dominant left thyroid lobe mass--- current ultrasound as outpatient advised  6) tobacco abuse--smoking cessation strongly advised  Discharge Condition: stable  Follow UP  Follow-up Information    [place] [facility] up on 10/10/2018.   Specialty:  Internal Medicine Why:  3 pm Contact information: [place] [id number] [place] [phone]          Diet and Activity recommendation:  As advised  Discharge Instructions  * Discharge Instructions    Call MD for:  difficulty breathing, headache or visual disturbances   Complete by:  As directed    Call MD for:  persistant dizziness or light-headedness   Complete by:  As directed    Call MD for:  persistant nausea and vomiting   Complete by:  As directed  Call MD for:  severe uncontrolled pain   Complete by:  As directed    Call MD for:  temperature >100.4   Complete by:  As directed    Diet - low sodium heart healthy   Complete by:  As directed    Discharge instructions   Complete by:  As directed    1) smoking cessation strongly advised 2) follow-up with infectious disease specialist Dr. Johnnye Sima for management of your HIV 3) follow-up to primary care physician for reevaluation if chest pain persist and for possible stress test as an outpatient   Increase activity slowly   Complete by:  As directed         Discharge Medications     Allergies as of 10/05/2018      Reactions   Shellfish Allergy Anaphylaxis   Shrimp      Medication List    TAKE these medications   acetaminophen 325 MG tablet Commonly known as:  TYLENOL Take 2 tablets (650  mg total) by mouth every 6 (six) hours as needed for mild pain, fever or headache (or Fever >/= 101).   benazepril 10 MG tablet Commonly known as:  LOTENSIN Take 1 tablet (10 mg total) by mouth daily.   emtricitabine-rilpivir-tenofovir AF 200-25-25 MG Tabs tablet Commonly known as:  ODEFSEY Take 1 tablet by mouth daily with breakfast.   FLUoxetine 20 MG capsule Commonly known as:  PROZAC Take 1 capsule (20 mg total) by mouth daily.   gabapentin 100 MG capsule Commonly known as:  NEURONTIN Take 1 capsule (100 mg total) by mouth at bedtime.   levothyroxine 25 MCG tablet Commonly known as:  SYNTHROID Take 1 tablet (25 mcg total) by mouth daily before breakfast.   methimazole 5 MG tablet Commonly known as:  TAPAZOLE TAKE 1 TABLET(5 MG) BY MOUTH TWICE DAILY       Major procedures and Radiology Reports - PLEASE review detailed and final reports for all details, in brief -   Dg Chest 2 View  Result Date: 10/04/2018 CLINICAL DATA:  Shortness of breath and chest heaviness EXAM: CHEST - 2 VIEW COMPARISON:  August 26, 2017 FINDINGS: No edema or consolidation. Heart size and pulmonary vascularity are normal. No adenopathy. No bone lesions. IMPRESSION: No edema or consolidation. Electronically Signed   By: Lowella Grip III M.D.   On: 10/04/2018 11:53   Ct Angio Chest Pe W And/or Wo Contrast  Result Date: 10/04/2018 CLINICAL DATA:  Chest pain and cough. History of deep venous thrombosis EXAM: CT ANGIOGRAPHY CHEST WITH CONTRAST TECHNIQUE: Multidetector CT imaging of the chest was performed using the standard protocol during bolus administration of intravenous contrast. Multiplanar CT image reconstructions and MIPs were obtained to evaluate the vascular anatomy. CONTRAST:  158mL OMNIPAQUE IOHEXOL 350 MG/ML SOLN COMPARISON:  October 04, 2018 chest radiograph FINDINGS: Cardiovascular: There is no demonstrable pulmonary embolus. There is no evident thoracic aortic aneurysm or dissection. The  visualized great vessels appear unremarkable. There is no pericardial effusion or pericardial thickening. Mediastinum/Nodes: There is diffuse thyroid enlargement with multiple nodular lesions throughout the thyroid. There is a dominant mass in the left lobe of the thyroid measuring 1.8 x 1.8 cm. There is no appreciable thoracic adenopathy. No esophageal lesions are evident. Lungs/Pleura: There is no edema or consolidation. There is mosaic attenuation throughout portions of the lungs bilaterally, particularly in the lower lobes, likely due to underlying small airways obstructive disease. No pleural effusion or pleural thickening evident. Upper Abdomen: Visualized upper abdominal structures  appear unremarkable. Musculoskeletal: There is degenerative change in the thoracic spine. There are no blastic or lytic bone lesions. No intramuscular lesions are evident. Review of the MIP images confirms the above findings. IMPRESSION: 1. No demonstrable pulmonary embolus. No thoracic aortic aneurysm or dissection. 2. Diffuse thyroid enlargement. Dominant mass left lobe thyroid. This finding may warrant nonemergent thyroid ultrasound to further evaluate. 3. Mosaic attenuation in portions of the lungs, likely indicative of a degree of small airways obstructive disease. No edema or consolidation. No well-defined ground-glass opacity. No pleural effusions. 4.  No evident thoracic adenopathy. Electronically Signed   By: Lowella Grip III M.D.   On: 10/04/2018 12:58   Dg Knee Complete 4 Views Left  Result Date: 10/04/2018 CLINICAL DATA:  53 year old female with a history of 2 weeks left knee pain EXAM: LEFT KNEE - COMPLETE 4+ VIEW COMPARISON:  03/05/2010 FINDINGS: No acute displaced fracture. Medial greater than lateral joint space narrowing with marginal osteophyte formation. Degenerative changes of the patellofemoral joint. No joint effusion. No radiopaque foreign body. IMPRESSION: Negative for acute bony abnormality.  Tricompartmental osteoarthritis Electronically Signed   By: Corrie Mckusick D.O.   On: 10/04/2018 11:56    Micro Results    Recent Results (from the past 240 hour(s))  SARS Coronavirus 2 [facility] order, Performed in [facility] hospital lab)     Status: None   Collection Time: 10/04/18  6:02 PM  Result Value Ref Range Status   SARS Coronavirus 2 NEGATIVE NEGATIVE Final    Comment: (NOTE) If result is NEGATIVE SARS-CoV-2 target nucleic acids are NOT DETECTED. The SARS-CoV-2 RNA is generally detectable in upper and lower  respiratory specimens during the acute phase of infection. The lowest  concentration of SARS-CoV-2 viral copies this assay can detect is 250  copies / mL. A negative result does not preclude SARS-CoV-2 infection  and should not be used as the sole basis for treatment or other  patient management decisions.  A negative result may occur with  improper specimen collection / handling, submission of specimen other  than nasopharyngeal swab, presence of viral mutation(s) within the  areas targeted by this assay, and inadequate number of viral copies  (<250 copies / mL). A negative result must be combined with clinical  observations, patient history, and epidemiological information. If result is POSITIVE SARS-CoV-2 target nucleic acids are DETECTED. The SARS-CoV-2 RNA is generally detectable in upper and lower  respiratory specimens dur ing the acute phase of infection.  Positive  results are indicative of active infection with SARS-CoV-2.  Clinical  correlation with patient history and other diagnostic information is  necessary to determine patient infection status.  Positive results do  not rule out bacterial infection or co-infection with other viruses. If result is PRESUMPTIVE POSTIVE SARS-CoV-2 nucleic acids MAY BE PRESENT.   A presumptive positive result was obtained on the submitted specimen  and confirmed on repeat testing.  While 2019 novel coronavirus   (SARS-CoV-2) nucleic acids may be present in the submitted sample  additional confirmatory testing may be necessary for epidemiological  and / or clinical management purposes  to differentiate between  SARS-CoV-2 and other Sarbecovirus currently known to infect humans.  If clinically indicated additional testing with an alternate test  methodology [phone]) is advised. The SARS-CoV-2 RNA is generally  detectable in upper and lower respiratory sp ecimens during the acute  phase of infection. The expected result is Negative. Fact Sheet for Patients:  StrictlyIdeas.no Fact Sheet for Healthcare Providers: BankingDealers.co.za This  test is not yet approved or cleared by the [place] and has been authorized for detection and/or diagnosis of SARS-CoV-2 by FDA under an Emergency Use Authorization (EUA).  This EUA will remain in effect (meaning this test can be used) for the duration of the COVID-19 declaration under Section 564(b)(1) of the Act, 21 U.S.C. section 360bbb-3(b)(1), unless the authorization is terminated or revoked sooner. Performed at [facility], [place] [address] [place], [place] [address]        Today   Subjective    [facility] today has no new concerns, chest pain-free...  However on palpation and with positional change chest pain reproducible          Patient has been seen and examined prior to discharge   Objective   Blood pressure (!) 150/86, pulse 78, temperature 98.2 F (36.8 C), temperature source Oral, resp. rate 18, height 5\' 3"  (1.6 m), weight 95.3 kg, SpO2 96 %.   Intake/Output Summary (Last 24 hours) at 10/05/2018 1043 Last data filed at 10/05/2018 0452 Gross per 24 hour  Intake 480 ml  Output -  Net 480 ml    Exam Gen:- Awake Alert, no acute distress  HEENT:- [place].AT, No sclera icterus Neck-Supple Neck,No JVD,.  Lungs-  CTAB , good air movement bilaterally  CV- S1, S2  normal, regular, on palpation of anterior chest wall and with positional change chest pain reproducible   Abd-  +ve B.Sounds, Abd Soft, No tenderness,    Extremity/Skin:- No  edema,   good pulses Psych-affect is appropriate, oriented x3 Neuro-no new focal deficits, no tremors    Data Review   CBC w Diff:  Lab Results  Component Value Date   WBC 7.4 10/04/2018   HGB 13.7 10/04/2018   HGB 14.0 03/24/2018   HCT 41.9 10/04/2018   HCT 40.0 03/24/2018   PLT 219 10/04/2018   PLT 232 03/24/2018   LYMPHOPCT 44 10/04/2018   BANDSPCT 0 08/15/2008   MONOPCT 10 10/04/2018   EOSPCT 1 10/04/2018   BASOPCT 1 10/04/2018    CMP:  Lab Results  Component Value Date   NA 139 10/04/2018   NA 140 03/24/2018   K 3.9 10/04/2018   CL 110 10/04/2018   CO2 24 10/04/2018   BUN 10 10/04/2018   BUN 12 03/24/2018   CREATININE 0.94 10/04/2018   CREATININE 0.99 10/27/2017   PROT 7.0 10/04/2018   PROT 6.7 03/24/2018   ALBUMIN 3.7 10/04/2018   ALBUMIN 4.2 03/24/2018   BILITOT 0.5 10/04/2018   BILITOT 0.2 03/24/2018   ALKPHOS 70 10/04/2018   AST 16 10/04/2018   ALT 18 10/04/2018  .   Total Discharge time is about 33 minutes  Roxan Hockey M.D on 10/05/2018 at 10:43 AM  Go to [email or web address] -  for contact info  [facility] - Office  [phone]

## 2018-10-05 NOTE — TOC Transition Note (Signed)
Transition of Care [facility]) - CM/SW Discharge Note   Patient Details  Name: Shannon [name] MRN: [id number] Date of Birth: 03-Dec-1965  Transition [facility]) CM/SW Contact:  Shannon Mayo, RN Phone Number: 10/05/2018, 9:57 AM   Clinical Narrative:    From home with brother and daughter, pta indep, chest pain, covid negative, hx of 042, she states she has a follow up apt at the [facility] on 5/4 at 3 pm.  She states she has no problems getting her medications and she is not being prescribed anything new on this admit, she has her medicines at home filled already.  She has no problems with transportation.  She has no other needs.  This NCM informed RN to contact NCM if they decide to discharge patient on a new medication, because then she may need ast with getting it.    Final next level of care: Home/Self Care Barriers to Discharge: No Barriers Identified   Patient Goals and CMS Choice Patient states their goals for this hospitalization and ongoing recovery are:: feel better   Choice offered to / list presented to : NA  Discharge Placement                       Discharge Plan and Services   Discharge Planning Services: CM Consult Post Acute Care Choice: NA          DME Arranged: N/A DME Agency: NA                  Social Determinants of Health (SDOH) Interventions     Readmission Risk Interventions Readmission Risk Prevention Plan 10/05/2018  Post Dischage Appt Complete  Medication Screening Complete  Transportation Screening Complete  Some recent data might be hidden

## 2018-10-05 NOTE — Discharge Instructions (Signed)
1) smoking cessation strongly advised 2) follow-up with infectious disease specialist Dr. Johnnye Sima for management of your HIV 3) follow-up to primary care physician for reevaluation if chest pain persist and for possible stress test as an outpatient

## 2018-10-05 NOTE — Progress Notes (Signed)
AVS given to patient, instructions reviewed. PIV removed. Patient disharged via wheelchair.

## 2018-10-07 ENCOUNTER — Telehealth: Payer: Self-pay

## 2018-10-07 DIAGNOSIS — F4310 Post-traumatic stress disorder, unspecified: Secondary | ICD-10-CM

## 2018-10-07 MED ORDER — FLUOXETINE HCL 20 MG PO CAPS: 20.0000 mg | ORAL_CAPSULE | Freq: Every day | ORAL | 0 refills | Status: DC

## 2018-10-07 NOTE — Telephone Encounter (Signed)
Message sent to provider 

## 2018-10-07 NOTE — Telephone Encounter (Signed)
Refill sent into pharmacy. Thanks!  

## 2018-10-10 ENCOUNTER — Ambulatory Visit: Payer: Self-pay | Admitting: Family Medicine

## 2018-10-11 ENCOUNTER — Encounter: Payer: Self-pay | Admitting: *Deleted

## 2018-10-17 ENCOUNTER — Ambulatory Visit: Payer: Self-pay | Admitting: Family Medicine

## 2018-11-02 ENCOUNTER — Telehealth: Payer: Self-pay

## 2018-11-02 NOTE — Telephone Encounter (Signed)
Called and spoke with patient for COVID 19 Screening. Patient had no risk factors and is cleared to come into office for appointment. Thanks! 

## 2018-11-03 ENCOUNTER — Ambulatory Visit (INDEPENDENT_AMBULATORY_CARE_PROVIDER_SITE_OTHER): Payer: 59 | Admitting: Family Medicine

## 2018-11-03 ENCOUNTER — Other Ambulatory Visit: Payer: Self-pay

## 2018-11-03 ENCOUNTER — Encounter: Payer: Self-pay | Admitting: Family Medicine

## 2018-11-03 VITALS — BP 128/84 | HR 76 | Temp 99.1°F | Resp 16 | Ht 62.0 in | Wt 214.0 lb

## 2018-11-03 DIAGNOSIS — I10 Essential (primary) hypertension: Secondary | ICD-10-CM

## 2018-11-03 DIAGNOSIS — L918 Other hypertrophic disorders of the skin: Secondary | ICD-10-CM | POA: Diagnosis not present

## 2018-11-03 DIAGNOSIS — E042 Nontoxic multinodular goiter: Secondary | ICD-10-CM | POA: Diagnosis not present

## 2018-11-03 DIAGNOSIS — F4310 Post-traumatic stress disorder, unspecified: Secondary | ICD-10-CM

## 2018-11-03 LAB — POCT URINALYSIS DIPSTICK
Bilirubin, UA: NEGATIVE
Blood, UA: NEGATIVE
Glucose, UA: NEGATIVE
Ketones, UA: NEGATIVE
Nitrite, UA: NEGATIVE
Protein, UA: NEGATIVE
Spec Grav, UA: 1.02 (ref 1.010–1.025)
Urobilinogen, UA: 0.2 E.U./dL
pH, UA: 5.5 (ref 5.0–8.0)

## 2018-11-03 MED ORDER — FLUOXETINE HCL 20 MG PO CAPS: 20.0000 mg | ORAL_CAPSULE | Freq: Every day | ORAL | 1 refills | Status: DC

## 2018-11-03 NOTE — Patient Instructions (Signed)
I ordered an ultrasound of your thyroid. Someone will call you to schedule this. I sent refills to the pharmacy. It was good to see you. I will be in touch with your lab results.   Skin Tag, Adult  A skin tag (acrochordon) is a soft, extra growth of skin. Most skin tags are flesh-colored and rarely bigger than a pencil eraser. They commonly form near areas where there are folds in the skin, such as the armpit or groin. Skin tags are not dangerous, and they do not spread from person to person (are not contagious). You may have one skin tag or several. Skin tags do not require treatment. However, your health care provider may recommend removal of a skin tag if it:  Gets irritated from clothing.  Bleeds.  Is visible and unsightly. Your health care provider can remove skin tags with a simple surgical procedure or a procedure that involves freezing the skin tag. Follow these instructions at home:  Watch for any changes in your skin tag. A normal skin tag does not require any other special care at home.  Take over-the-counter and prescription medicines only as told by your health care provider.  Keep all follow-up visits as told by your health care provider. This is important. Contact a health care provider if:  You have a skin tag that: ? Becomes painful. ? Changes color. ? Bleeds. ? Swells.  You develop more skin tags. This information is not intended to replace advice given to you by your health care provider. Make sure you discuss any questions you have with your health care provider. Document Released: 06/09/2015 Document Revised: 01/19/2016 Document Reviewed: 06/09/2015 Elsevier Interactive Patient Education  2019 [employer].

## 2018-11-03 NOTE — Progress Notes (Signed)
[facility]   Progress Note: General Provider: Lanae Boast, FNP  SUBJECTIVE:   Shannon [name] is a 53 y.o. female who  has a past medical history of Arnold-Chiari malformation ([place]), DVT (deep venous thrombosis) ([place]), HIV disease ([place]), Hypertension, and Multiple thyroid nodules.. Patient presents today for Hypertension Patient followed by endocrinology for thyroid nodules and Infectious Disease for HIV. Patient hospitalized from 4/28-4/29/2020 due to atypical chest pain. She reports that the pain is resolved after taking tylenol. She reports doing well on prozac. She states that her depression is well controlled while on medications. She denies psychosis, delusional thinking, suicidal/homicidal ideations, intent or plan.  She reports compliance with medications and denies side effects at the present time. She is concerned about her thyroid and has not seen the endocrinologist in almost a year per patient.  Also has a lesion that she would like to have evaluated.   Review of Systems  Constitutional: Negative.   HENT: Negative.   Eyes: Negative.   Respiratory: Negative.   Cardiovascular: Negative.   Gastrointestinal: Negative.   Genitourinary: Negative.   Musculoskeletal: Negative.   Skin: Negative.   Neurological: Negative.   Psychiatric/Behavioral: Negative.      OBJECTIVE: BP 128/84 (BP Location: Left Arm, Patient Position: Sitting, Cuff Size: Normal)   Pulse 76   Temp 99.1 F (37.3 C) (Oral)   Resp 16   Ht 5\' 2"  (1.575 m)   Wt 214 lb (97.1 kg)   SpO2 100%   BMI 39.14 kg/m   Wt Readings from Last 3 Encounters:  11/03/18 214 lb (97.1 kg)  10/04/18 210 lb (95.3 kg)  06/23/18 217 lb (98.4 kg)     Physical Exam Vitals signs and nursing note reviewed.  Constitutional:      General: She is not in acute distress.    Appearance: Normal appearance.  HENT:     Head: Normocephalic and atraumatic.  Eyes:   Extraocular Movements: Extraocular movements intact.     Conjunctiva/sclera: Conjunctivae normal.     Pupils: Pupils are equal, round, and reactive to light.  Cardiovascular:     Rate and Rhythm: Normal rate.  Pulmonary:     Effort: Pulmonary effort is normal.  Musculoskeletal: Normal range of motion.  Skin:    Findings: Lesion (skin tag noted to left neck. ) present.  Neurological:     Mental Status: She is alert and oriented to person, place, and time. Mental status is at baseline.  Psychiatric:        Mood and Affect: Mood normal.        Behavior: Behavior normal.        Thought Content: Thought content normal.        Judgment: Judgment normal.     ASSESSMENT/PLAN:  1. Essential hypertension - Urinalysis Dipstick  2. PTSD (post-traumatic stress disorder) - FLUoxetine (PROZAC) 20 MG capsule; Take 1 capsule (20 mg total) by mouth daily.  Dispense: 90 capsule; Refill: 1  3. Multinodular goiter - Thyroid Panel With TSH - US THYROID; Future  4. Skin tag  No medication changes warranted at the present time.  Patient is being followed by a specialist for HIV and thyroid. U/s ordered. Patient advised to continue with follow up appointments. PCP will continue to monitor progress.       Return in about 3 months (around 02/03/2019) for HTN and thyroid.    The patient was given clear instructions to go to ER or return  to medical center if symptoms do not improve, worsen or new problems develop. The patient verbalized understanding and agreed with plan of care.   [name], FNP-BC [name] [address] [place], [place] [address] [phone]

## 2018-11-04 LAB — THYROID PANEL WITH TSH
Free Thyroxine Index: 2 (ref 1.2–4.9)
T3 Uptake Ratio: 27 % (ref 24–39)
T4, Total: 7.4 ug/dL (ref 4.5–12.0)
TSH: 0.128 u[IU]/mL — ABNORMAL LOW (ref 0.450–4.500)

## 2018-11-07 ENCOUNTER — Other Ambulatory Visit: Payer: 59

## 2018-11-07 ENCOUNTER — Encounter (INDEPENDENT_AMBULATORY_CARE_PROVIDER_SITE_OTHER): Payer: 59 | Admitting: *Deleted

## 2018-11-07 ENCOUNTER — Other Ambulatory Visit: Payer: Self-pay

## 2018-11-07 VITALS — BP 161/108 | HR 77 | Temp 98.5°F | Wt 212.2 lb

## 2018-11-07 DIAGNOSIS — Z006 Encounter for examination for normal comparison and control in clinical research program: Secondary | ICD-10-CM

## 2018-11-07 DIAGNOSIS — B20 Human immunodeficiency virus [HIV] disease: Secondary | ICD-10-CM

## 2018-11-07 DIAGNOSIS — Z79899 Other long term (current) drug therapy: Secondary | ICD-10-CM

## 2018-11-07 DIAGNOSIS — Z113 Encounter for screening for infections with a predominantly sexual mode of transmission: Secondary | ICD-10-CM

## 2018-11-07 NOTE — Research (Signed)
Shannon [name] was here for her month 54 visit for [employer]. She was hospitalized in April for symptoms concerning for Covid, but had tested negative. She was having chest pressure, SOB and fatigue. She said it only lasted about 4 days and had no fever/chills. She denies any current problems. She will be returning in [place] for the next study visit.

## 2018-11-08 LAB — T-HELPER CELL (CD4) - (RCID CLINIC ONLY)
CD4 % Helper T Cell: 35 % (ref 33–65)
CD4 T Cell Abs: 1032 /uL (ref 400–1790)

## 2018-11-14 ENCOUNTER — Ambulatory Visit (HOSPITAL_COMMUNITY): Admission: RE | Admit: 2018-11-14 | Payer: Managed Care, Other (non HMO) | Source: Ambulatory Visit

## 2018-11-15 LAB — COMPREHENSIVE METABOLIC PANEL
AG Ratio: 1.3 (calc) (ref 1.0–2.5)
ALT: 15 U/L (ref 6–29)
AST: 15 U/L (ref 10–35)
Albumin: 3.9 g/dL (ref 3.6–5.1)
Alkaline phosphatase (APISO): 72 U/L (ref 37–153)
BUN: 9 mg/dL (ref 7–25)
CO2: 26 mmol/L (ref 20–32)
Calcium: 9.4 mg/dL (ref 8.6–10.4)
Chloride: 108 mmol/L (ref 98–110)
Creat: 0.98 mg/dL (ref 0.50–1.05)
Globulin: 3 g/dL (calc) (ref 1.9–3.7)
Glucose, Bld: 126 mg/dL — ABNORMAL HIGH (ref 65–99)
Potassium: 4.1 mmol/L (ref 3.5–5.3)
Sodium: 141 mmol/L (ref 135–146)
Total Bilirubin: 0.4 mg/dL (ref 0.2–1.2)
Total Protein: 6.9 g/dL (ref 6.1–8.1)

## 2018-11-15 LAB — LIPID PANEL
Cholesterol: 203 mg/dL — ABNORMAL HIGH (ref <200)
HDL: 48 mg/dL — ABNORMAL LOW (ref >=50)
LDL Cholesterol (Calc): 138 mg/dL (calc) — ABNORMAL HIGH
Non-HDL Cholesterol (Calc): 155 mg/dL (calc) — ABNORMAL HIGH (ref <130)
Total CHOL/HDL Ratio: 4.2 (calc) (ref <5.0)
Triglycerides: 80 mg/dL (ref <150)

## 2018-11-15 LAB — RPR: RPR Ser Ql: NONREACTIVE

## 2018-11-15 LAB — CBC
HCT: 44.5 % (ref 35.0–45.0)
Hemoglobin: 14.6 g/dL (ref 11.7–15.5)
MCH: 28.1 pg (ref 27.0–33.0)
MCHC: 32.8 g/dL (ref 32.0–36.0)
MCV: 85.7 fL (ref 80.0–100.0)
MPV: 9.9 fL (ref 7.5–12.5)
Platelets: 251 10*3/uL (ref 140–400)
RBC: 5.19 10*6/uL — ABNORMAL HIGH (ref 3.80–5.10)
RDW: 14 % (ref 11.0–15.0)
WBC: 6.8 10*3/uL (ref 3.8–10.8)

## 2018-11-15 LAB — HIV-1 RNA QUANT-NO REFLEX-BLD
HIV 1 RNA Quant: <20 copies/mL
HIV-1 RNA Quant, Log: <1.3 Log copies/mL

## 2018-11-22 ENCOUNTER — Ambulatory Visit (HOSPITAL_COMMUNITY): Admission: RE | Admit: 2018-11-22 | Payer: Managed Care, Other (non HMO) | Source: Ambulatory Visit

## 2018-11-22 ENCOUNTER — Encounter (HOSPITAL_COMMUNITY): Payer: Self-pay

## 2018-12-09 ENCOUNTER — Other Ambulatory Visit: Payer: Self-pay | Admitting: Infectious Diseases

## 2018-12-09 DIAGNOSIS — B20 Human immunodeficiency virus [HIV] disease: Secondary | ICD-10-CM

## 2018-12-13 ENCOUNTER — Encounter: Payer: Self-pay | Admitting: Infectious Diseases

## 2018-12-13 ENCOUNTER — Ambulatory Visit (INDEPENDENT_AMBULATORY_CARE_PROVIDER_SITE_OTHER): Payer: Managed Care, Other (non HMO) | Admitting: Infectious Diseases

## 2018-12-13 ENCOUNTER — Other Ambulatory Visit: Payer: Self-pay

## 2018-12-13 VITALS — BP 148/94 | HR 86 | Temp 98.3°F | Ht 62.0 in | Wt 213.0 lb

## 2018-12-13 DIAGNOSIS — F17200 Nicotine dependence, unspecified, uncomplicated: Secondary | ICD-10-CM

## 2018-12-13 DIAGNOSIS — B20 Human immunodeficiency virus [HIV] disease: Secondary | ICD-10-CM | POA: Diagnosis not present

## 2018-12-13 DIAGNOSIS — Z113 Encounter for screening for infections with a predominantly sexual mode of transmission: Secondary | ICD-10-CM

## 2018-12-13 DIAGNOSIS — E041 Nontoxic single thyroid nodule: Secondary | ICD-10-CM

## 2018-12-13 DIAGNOSIS — I10 Essential (primary) hypertension: Secondary | ICD-10-CM | POA: Diagnosis not present

## 2018-12-13 DIAGNOSIS — Z79899 Other long term (current) drug therapy: Secondary | ICD-10-CM

## 2018-12-13 NOTE — Assessment & Plan Note (Signed)
Needs to follow up with PCP.  She is aware.

## 2018-12-13 NOTE — Assessment & Plan Note (Signed)
Encouraged to quit smoking.  

## 2018-12-13 NOTE — Assessment & Plan Note (Addendum)
She is doing well Will schedule for mammo, pap.  She also needs colonoscopy.  PNVx 23- she defers.  Offered/refused condoms.  Continue odefsey rtc in 9 months.

## 2018-12-13 NOTE — Assessment & Plan Note (Signed)
>>  ASSESSMENT AND PLAN FOR THYROID  NODULE WRITTEN ON 12/13/2018  3:31 PM BY HATCHER, JEFFREY C, MD  Encouraged her to f/u with Endo. She states she will do this.

## 2018-12-13 NOTE — Assessment & Plan Note (Signed)
Encouraged her to f/u with Endo. She states she will do this.

## 2018-12-13 NOTE — Progress Notes (Signed)
   Subjective:    Patient ID: Shannon [name], female    DOB: July 09, 1965, 53 y.o.   MRN: [id number]  HPI 53yo F HIV+, previous Chairi 1 repair (2000). Prev taking atripla, changed to [facility] 2016. Takes with food.  Has not had PAP or colon (scheduled, rescheduled) She had thyroid scan on 09-10-16 showing:Multinodular thyroid gland demonstrating small warm and cold nodules in both lobes. Last endo appt ? Working from home. Unable to get financial aid due to her prev student loans.  Has therapist as well as Dr Sharl Ma for psychiatry (on prozac). Still some depression, "I eat cheesecake".  Has been feeling "great". Stays busy walking her dog as well. Daughter graduated from college.  Last CMP showed Glc 126. Was in hospital 09-2018 with (reproducible, pleuritic) chest pain, troponins and cardiac CT (-).   HIV 1 RNA Quant (copies/mL)  Date Value  11/07/2018 <20 NOT DETECTED  10/27/2017 <20 DETECTED (A)  09/28/2016 <20 NOT DETECTED   CD4 T Cell Abs (/uL)  Date Value  11/07/2018 1,032  10/27/2017 1,230  09/28/2016 660   Last PAP 11-2017.  Last Mammo 01-2018 (usually gets at work).   Review of Systems  Constitutional: Negative for appetite change, chills, fever and unexpected weight change.  Respiratory: Negative for shortness of breath.   Cardiovascular: Negative for chest pain.  Gastrointestinal: Negative for constipation and diarrhea.  Genitourinary: Negative for difficulty urinating.  Neurological: Negative for headaches.   No sick exposures.     Objective:   Physical Exam Constitutional:      Appearance: Normal appearance. She is obese.  HENT:     Mouth/Throat:     Mouth: Mucous membranes are moist.     Pharynx: Oropharynx is clear. No oropharyngeal exudate.  Eyes:     Extraocular Movements: Extraocular movements intact.     Pupils: Pupils are equal, round, and reactive to light.  Neck:     Musculoskeletal: Normal range of motion and neck supple.  Cardiovascular:   Rate and Rhythm: Normal rate and regular rhythm.  Pulmonary:     Effort: Pulmonary effort is normal.     Breath sounds: Normal breath sounds.  Abdominal:     General: Bowel sounds are normal. There is no distension.     Palpations: Abdomen is soft.     Tenderness: There is no abdominal tenderness.  Musculoskeletal:     Right lower leg: No edema.     Left lower leg: No edema.  Neurological:     Mental Status: She is alert.  Psychiatric:        Mood and Affect: Mood normal.       Assessment & Plan:

## 2019-01-04 ENCOUNTER — Other Ambulatory Visit: Payer: Self-pay | Admitting: Infectious Diseases

## 2019-01-04 DIAGNOSIS — B20 Human immunodeficiency virus [HIV] disease: Secondary | ICD-10-CM

## 2019-01-04 DIAGNOSIS — Z1231 Encounter for screening mammogram for malignant neoplasm of breast: Secondary | ICD-10-CM

## 2019-01-14 ENCOUNTER — Other Ambulatory Visit: Payer: Self-pay | Admitting: Infectious Diseases

## 2019-01-14 DIAGNOSIS — B20 Human immunodeficiency virus [HIV] disease: Secondary | ICD-10-CM

## 2019-02-03 ENCOUNTER — Other Ambulatory Visit: Payer: Self-pay

## 2019-02-03 ENCOUNTER — Encounter: Payer: Self-pay | Admitting: Family Medicine

## 2019-02-03 ENCOUNTER — Ambulatory Visit (INDEPENDENT_AMBULATORY_CARE_PROVIDER_SITE_OTHER): Payer: 59 | Admitting: Family Medicine

## 2019-02-03 VITALS — BP 126/80 | HR 86 | Temp 98.4°F | Resp 16 | Ht 62.0 in | Wt 215.0 lb

## 2019-02-03 DIAGNOSIS — B379 Candidiasis, unspecified: Secondary | ICD-10-CM

## 2019-02-03 DIAGNOSIS — I10 Essential (primary) hypertension: Secondary | ICD-10-CM

## 2019-02-03 DIAGNOSIS — T3695XA Adverse effect of unspecified systemic antibiotic, initial encounter: Secondary | ICD-10-CM

## 2019-02-03 DIAGNOSIS — M25571 Pain in right ankle and joints of right foot: Secondary | ICD-10-CM

## 2019-02-03 DIAGNOSIS — H6692 Otitis media, unspecified, left ear: Secondary | ICD-10-CM | POA: Diagnosis not present

## 2019-02-03 MED ORDER — AMOXICILLIN-POT CLAVULANATE 875-125 MG PO TABS: 1.0000 | ORAL_TABLET | Freq: Two times a day (BID) | ORAL | 0 refills | Status: AC

## 2019-02-03 MED ORDER — FLUCONAZOLE 150 MG PO TABS: 150.0000 mg | ORAL_TABLET | Freq: Once | ORAL | 0 refills | Status: AC

## 2019-02-03 MED ORDER — BENAZEPRIL HCL 10 MG PO TABS: 10.0000 mg | ORAL_TABLET | Freq: Every day | ORAL | 2 refills | Status: DC

## 2019-02-03 NOTE — Patient Instructions (Signed)
RICE Therapy for Routine Care of Injuries Many injuries can be cared for with rest, ice, compression, and elevation (RICE therapy). This includes:  Resting the injured part.  Putting ice on the injury.  Putting pressure (compression) on the injury.  Raising the injured part (elevation). Using RICE therapy can help to lessen pain and swelling. Supplies needed:  Ice.  Plastic bag.  Towel.  Elastic bandage.  Pillow or pillows to raise (elevate) your injured body part. How to care for your injury with RICE therapy Rest Limit your normal activities, and try not to use the injured part of your body. You can go back to your normal activities when your doctor says it is okay to do them and you feel okay. Ask your doctor if you should do exercises to help your injury get better. Ice Put ice on the injured area. Do not put ice on your bare skin.  Put ice in a plastic bag.  Place a towel between your skin and the bag.  Leave the ice on for 20 minutes, 2-3 times a day. Use ice on as many days as told by your doctor.  Compression Compression means putting pressure on the injured area. This can be done with an elastic bandage. If an elastic bandage has been put on your injury:  Do not wrap the bandage too tight. Wrap the bandage more loosely if part of your body away from the bandage is blue, swollen, cold, painful, or loses feeling (gets numb).  Take off the bandage and put it on again. Do this every 3-4 hours or as told by your doctor.  See your doctor if the bandage seems to make your problems worse.  Elevation Elevation means keeping the injured area raised. If you can, raise the injured area above your heart or the center of your chest. Contact a doctor if:  You keep having pain and swelling.  Your symptoms get worse. Get help right away if:  You have sudden bad pain at your injury or lower than your injury.  You have redness or more swelling around your injury.  You  have tingling or numbness at your injury or lower than your injury, and it does not go away when you take off the bandage. Summary  Many injuries can be cared for using rest, ice, compression, and elevation (RICE therapy).  You can go back to your normal activities when you feel okay and your doctor says it is okay.  Put ice on the injured area as told by your doctor.  Get help if your symptoms get worse or if you keep having pain and swelling. This information is not intended to replace advice given to you by your health care provider. Make sure you discuss any questions you have with your health care provider. Document Released: 11/11/2007 Document Revised: 02/12/2017 Document Reviewed: 02/12/2017 Elsevier Patient Education  2020 [employer]. Hypertension, Adult Hypertension is another name for high blood pressure. High blood pressure forces your heart to work harder to pump blood. This can cause problems over time. There are two numbers in a blood pressure reading. There is a top number (systolic) over a bottom number (diastolic). It is best to have a blood pressure that is below 120/80. Healthy choices can help lower your blood pressure, or you may need medicine to help lower it. What are the causes? The cause of this condition is not known. Some conditions may be related to high blood pressure. What increases the risk?  Smoking.  Having type 2 diabetes mellitus, high cholesterol, or both.  Not getting enough exercise or physical activity.  Being overweight.  Having too much fat, sugar, calories, or salt (sodium) in your diet.  Drinking too much alcohol.  Having long-term (chronic) kidney disease.  Having a family history of high blood pressure.  Age. Risk increases with age.  Race. You may be at higher risk if you are African American.  Gender. Men are at higher risk than women before age 69. After age 83, women are at higher risk than men.  Having obstructive sleep  apnea.  Stress. What are the signs or symptoms?  High blood pressure may not cause symptoms. Very high blood pressure (hypertensive crisis) may cause: ? Headache. ? Feelings of worry or nervousness (anxiety). ? Shortness of breath. ? Nosebleed. ? A feeling of being sick to your stomach (nausea). ? Throwing up (vomiting). ? Changes in how you see. ? Very bad chest pain. ? Seizures. How is this treated?  This condition is treated by making healthy lifestyle changes, such as: ? Eating healthy foods. ? Exercising more. ? Drinking less alcohol.  Your health care provider may prescribe medicine if lifestyle changes are not enough to get your blood pressure under control, and if: ? Your top number is above 130. ? Your bottom number is above 80.  Your personal target blood pressure may vary. Follow these instructions at home: Eating and drinking   If told, follow the DASH eating plan. To follow this plan: ? Fill one half of your plate at each meal with fruits and vegetables. ? Fill one fourth of your plate at each meal with whole grains. Whole grains include whole-wheat pasta, brown rice, and whole-grain bread. ? Eat or drink low-fat dairy products, such as skim milk or low-fat yogurt. ? Fill one fourth of your plate at each meal with low-fat (lean) proteins. Low-fat proteins include fish, chicken without skin, eggs, beans, and tofu. ? Avoid fatty meat, cured and processed meat, or chicken with skin. ? Avoid pre-made or processed food.  Eat less than 1,500 mg of salt each day.  Do not drink alcohol if: ? Your doctor tells you not to drink. ? You are pregnant, may be pregnant, or are planning to become pregnant.  If you drink alcohol: ? Limit how much you use to:  0-1 drink a day for women.  0-2 drinks a day for men. ? Be aware of how much alcohol is in your drink. In the [place] one drink equals one 12 oz bottle of beer (355 mL), one 5 oz glass of wine (148 mL), or one 1 oz  glass of hard liquor (44 mL). Lifestyle   Work with your doctor to stay at a healthy weight or to lose weight. Ask your doctor what the best weight is for you.  Get at least 30 minutes of exercise most days of the week. This may include walking, swimming, or biking.  Get at least 30 minutes of exercise that strengthens your muscles (resistance exercise) at least 3 days a week. This may include lifting weights or doing Pilates.  Do not use any products that contain nicotine or tobacco, such as cigarettes, e-cigarettes, and chewing tobacco. If you need help quitting, ask your doctor.  Check your blood pressure at home as told by your doctor.  Keep all follow-up visits as told by your doctor. This is important. Medicines  Take over-the-counter and prescription medicines only as told by your doctor. Follow  directions carefully.  Do not skip doses of blood pressure medicine. The medicine does not work as well if you skip doses. Skipping doses also puts you at risk for problems.  Ask your doctor about side effects or reactions to medicines that you should watch for. Contact a doctor if you:  Think you are having a reaction to the medicine you are taking.  Have headaches that keep coming back (recurring).  Feel dizzy.  Have swelling in your ankles.  Have trouble with your vision. Get help right away if you:  Get a very bad headache.  Start to feel mixed up (confused).  Feel weak or numb.  Feel faint.  Have very bad pain in your: ? Chest. ? Belly (abdomen).  Throw up more than once.  Have trouble breathing. Summary  Hypertension is another name for high blood pressure.  High blood pressure forces your heart to work harder to pump blood.  For most people, a normal blood pressure is less than 120/80.  Making healthy choices can help lower blood pressure. If your blood pressure does not get lower with healthy choices, you may need to take medicine. This information is  not intended to replace advice given to you by your health care provider. Make sure you discuss any questions you have with your health care provider. Document Released: 11/11/2007 Document Revised: 02/02/2018 Document Reviewed: 02/02/2018 Elsevier Patient Education  2020 [employer].

## 2019-02-03 NOTE — Progress Notes (Signed)
Patient [facility]   Progress Note: General Provider: Lanae Boast, FNP  SUBJECTIVE:   Shannon [name] is a 53 y.o. female who  has a past medical history of Arnold-Chiari malformation ([place]), DVT (deep venous thrombosis) ([place]), HIV disease ([place]), Hypertension, and Multiple thyroid nodules.. Patient presents today for Dizziness, Hypertension (1 week ), Ear Pain (left ear pain/trouble hearing x 1 week ), and Vaginitis (wants rx for yeast infection due to being on abx.) Patient with left ear pain and on amoxicillin 500mg  BID. Has taken 3 doses without any relief. Describes the pain as throbbing. Mild swelling noted to the left jaw. She states that she has mild swelling in the left ankle. Denies specific injury. Mildly painful x 2 weeks. Has taken tylenol with mild relief.    Review of Systems  Constitutional: Negative.   HENT: Positive for ear pain.   Eyes: Negative.   Respiratory: Negative.   Cardiovascular: Negative.   Gastrointestinal: Negative.   Genitourinary: Negative.   Musculoskeletal: Positive for joint pain (right ankle).  Skin: Negative.   Neurological: Negative.   Psychiatric/Behavioral: Negative.      OBJECTIVE: BP 126/80 (BP Location: Left Arm, Patient Position: Sitting, Cuff Size: Large)   Pulse 86   Temp 98.4 F (36.9 C) (Oral)   Resp 16   Ht 5\' 2"  (1.575 m)   Wt 215 lb (97.5 kg)   SpO2 98%   BMI 39.32 kg/m   Wt Readings from Last 3 Encounters:  02/03/19 215 lb (97.5 kg)  12/13/18 213 lb (96.6 kg)  11/07/18 212 lb 4 oz (96.3 kg)     Physical Exam Vitals signs and nursing note reviewed.  Constitutional:      General: She is not in acute distress.    Appearance: Normal appearance.  HENT:     Head: Normocephalic and atraumatic.     Right Ear: Tympanic membrane normal.     Left Ear: Swelling and tenderness present. There is impacted cerumen.  Eyes:     Extraocular Movements: Extraocular movements intact.     Conjunctiva/sclera: Conjunctivae normal.     Pupils: Pupils are equal, round, and reactive to light.  Cardiovascular:     Rate and Rhythm: Normal rate and regular rhythm.     Heart sounds: No murmur.  Pulmonary:     Effort: Pulmonary effort is normal.     Breath sounds: Normal breath sounds.  Musculoskeletal: Normal range of motion.     Right ankle: She exhibits swelling. Tenderness.  Skin:    General: Skin is warm and dry.  Neurological:     Mental Status: She is alert and oriented to person, place, and time.  Psychiatric:        Mood and Affect: Mood normal.        Behavior: Behavior normal.        Thought Content: Thought content normal.        Judgment: Judgment normal.     ASSESSMENT/PLAN:  1. Essential hypertension - Urinalysis Dipstick - benazepril (LOTENSIN) 10 MG tablet; Take 1 tablet (10 mg total) by mouth daily.  Dispense: 30 tablet; Refill: 2  2. Antibiotic-induced yeast infection - fluconazole (DIFLUCAN) 150 MG tablet; Take 1 tablet (150 mg total) by mouth once for 1 dose.  Dispense: 1 tablet; Refill: 0  3. Left otitis media, unspecified otitis media type - amoxicillin-clavulanate (AUGMENTIN) 875-125 MG tablet; Take 1 tablet by mouth every 12 (twelve) hours for 7 days.  Dispense:  14 tablet; Refill: 0  4. Acute right ankle pain RICE therapy and otc analgesics.   Return in about 3 months (around 05/06/2019) for htn.    The patient was given clear instructions to go to ER or return to medical center if symptoms do not improve, worsen or new problems develop. The patient verbalized understanding and agreed with plan of care.   [name], FNP-BC [facility] [address] [place], [place] [address] [phone]

## 2019-02-15 ENCOUNTER — Encounter (HOSPITAL_COMMUNITY): Payer: Self-pay

## 2019-02-15 ENCOUNTER — Encounter (HOSPITAL_COMMUNITY): Payer: Self-pay | Admitting: *Deleted

## 2019-02-16 ENCOUNTER — Encounter: Payer: 59 | Admitting: *Deleted

## 2019-02-20 ENCOUNTER — Ambulatory Visit: Payer: Managed Care, Other (non HMO)

## 2019-02-24 ENCOUNTER — Encounter (INDEPENDENT_AMBULATORY_CARE_PROVIDER_SITE_OTHER): Payer: 59 | Admitting: *Deleted

## 2019-02-24 ENCOUNTER — Other Ambulatory Visit: Payer: Self-pay

## 2019-02-24 VITALS — BP 142/98 | HR 80 | Temp 98.9°F

## 2019-02-24 DIAGNOSIS — Z006 Encounter for examination for normal comparison and control in clinical research program: Secondary | ICD-10-CM

## 2019-02-24 NOTE — Research (Signed)
Shannon [name] was here for her month 35 visit for Reprieve. She has been doing well except for some left ear pain and some swelling in the left ankle which has resolved.Her adherence has been excellent with all her medications. She will be returning in December for the next visit.

## 2019-03-07 ENCOUNTER — Other Ambulatory Visit: Payer: Self-pay

## 2019-03-07 DIAGNOSIS — I10 Essential (primary) hypertension: Secondary | ICD-10-CM

## 2019-03-07 DIAGNOSIS — F4310 Post-traumatic stress disorder, unspecified: Secondary | ICD-10-CM

## 2019-03-07 MED ORDER — FLUOXETINE HCL 20 MG PO CAPS: 20.0000 mg | ORAL_CAPSULE | Freq: Every day | ORAL | 1 refills | Status: DC

## 2019-03-07 MED ORDER — BENAZEPRIL HCL 10 MG PO TABS: 10.0000 mg | ORAL_TABLET | Freq: Every day | ORAL | 2 refills | Status: DC

## 2019-05-12 ENCOUNTER — Ambulatory Visit: Payer: Self-pay | Admitting: Family Medicine

## 2019-05-23 ENCOUNTER — Encounter: Payer: Managed Care, Other (non HMO) | Admitting: *Deleted

## 2019-05-26 ENCOUNTER — Encounter: Payer: Managed Care, Other (non HMO) | Admitting: *Deleted

## 2019-05-31 ENCOUNTER — Encounter (INDEPENDENT_AMBULATORY_CARE_PROVIDER_SITE_OTHER): Payer: Self-pay | Admitting: *Deleted

## 2019-05-31 ENCOUNTER — Other Ambulatory Visit: Payer: Self-pay

## 2019-05-31 VITALS — BP 133/87 | HR 83 | Temp 99.0°F | Wt 214.0 lb

## 2019-05-31 DIAGNOSIS — Z006 Encounter for examination for normal comparison and control in clinical research program: Secondary | ICD-10-CM

## 2019-05-31 NOTE — Research (Signed)
Shannon [name] is here for her month 15 Reprieve visit. No new complaints or medications. Verbalized excellent adherence with her ARV and study medications. Denied any muscle aches or weakness. She will return in April to see Dr. Johnnye Sima and for her next study visit.

## 2019-07-05 ENCOUNTER — Encounter: Payer: Managed Care, Other (non HMO) | Admitting: *Deleted

## 2019-08-11 ENCOUNTER — Other Ambulatory Visit: Payer: Self-pay

## 2019-08-11 DIAGNOSIS — I10 Essential (primary) hypertension: Secondary | ICD-10-CM

## 2019-08-11 MED ORDER — BENAZEPRIL HCL 10 MG PO TABS: 10.0000 mg | ORAL_TABLET | Freq: Every day | ORAL | 2 refills | Status: DC

## 2019-08-20 ENCOUNTER — Other Ambulatory Visit: Payer: Self-pay | Admitting: Infectious Diseases

## 2019-08-20 DIAGNOSIS — B20 Human immunodeficiency virus [HIV] disease: Secondary | ICD-10-CM

## 2019-08-31 ENCOUNTER — Other Ambulatory Visit: Payer: Self-pay

## 2019-08-31 ENCOUNTER — Ambulatory Visit (INDEPENDENT_AMBULATORY_CARE_PROVIDER_SITE_OTHER): Payer: 59 | Admitting: Nurse Practitioner

## 2019-08-31 ENCOUNTER — Encounter: Payer: Self-pay | Admitting: Nurse Practitioner

## 2019-08-31 VITALS — BP 142/90 | HR 84 | Temp 98.3°F | Ht 62.0 in | Wt 213.8 lb

## 2019-08-31 DIAGNOSIS — Z0000 Encounter for general adult medical examination without abnormal findings: Secondary | ICD-10-CM | POA: Diagnosis not present

## 2019-08-31 DIAGNOSIS — Z1211 Encounter for screening for malignant neoplasm of colon: Secondary | ICD-10-CM | POA: Diagnosis not present

## 2019-08-31 DIAGNOSIS — B20 Human immunodeficiency virus [HIV] disease: Secondary | ICD-10-CM | POA: Diagnosis not present

## 2019-08-31 DIAGNOSIS — I10 Essential (primary) hypertension: Secondary | ICD-10-CM

## 2019-08-31 LAB — POCT URINALYSIS DIPSTICK
Bilirubin, UA: NEGATIVE
Blood, UA: NEGATIVE
Glucose, UA: NEGATIVE
Ketones, UA: NEGATIVE
Leukocytes, UA: NEGATIVE
Nitrite, UA: NEGATIVE
Protein, UA: NEGATIVE
Spec Grav, UA: 1.015 (ref 1.010–1.025)
Urobilinogen, UA: 0.2 E.U./dL
pH, UA: 6.5 (ref 5.0–8.0)

## 2019-08-31 MED ORDER — BENAZEPRIL HCL 10 MG PO TABS: 10.0000 mg | ORAL_TABLET | Freq: Every day | ORAL | 0 refills | Status: DC

## 2019-08-31 NOTE — Progress Notes (Signed)
[place] [place], [place]  [address] Phone:  [phone]   Fax:  [phone]  Established Patient Office Visit  Subjective:  Patient ID: Shannon [name], female    DOB: [birth year]  Age: 54 y.o. MRN: [id number]  CC:  Chief Complaint  Patient presents with  . Follow-up    EST Care - HTN    HPI Shannon [name] presents for follow up.  She  has a past medical history of Arnold-Chiari malformation ([place]), DVT (deep venous thrombosis) ([place]), HIV disease ([place]), Hypertension, and Multiple thyroid nodules.  She is a previous patient of Shannon [name], [place].   Abdominal Pain Patient complains of abdominal pain. The pain is described as cramping, and is 5/10 in intensity. The patient is experiencing RUQ pain without radiation. Onset was 2 months ago. Symptoms have been unchanged. Aggravating factors: eating.  Alleviating factors: none. Associated symptoms: none. The patient denies anorexia, belching, chills, constipation, diarrhea, dysuria, fever, nausea and vomiting. She has not noticed a pattern. She admits that she needs to cut the carbs. She feels like the carbs are making her sleep. She is cutting her carbs. She has not cut sugar but she is more aware.   Hypertension Patient is here for follow-up of elevated blood pressure. She is exercising and is adherent to a low-salt diet. Blood pressure does well controlled at home. Cardiac symptoms: none. Patient denies chest pain, dyspnea, fatigue, irregular heart beat, lower extremity edema, palpitations and syncope. Cardiovascular risk factors: obesity (BMI >= 30 kg/m2). Use of agents associated with hypertension: none. History of target organ damage: none.  Past Medical History:  Diagnosis Date  . Arnold-Chiari malformation ([place])   . DVT (deep venous thrombosis) ([place])   . HIV disease ([place])   . Hypertension   . Multiple thyroid nodules    Fully functioning thyroid with no treatment indicated     Past Surgical History:  Procedure Laterality Date  . ABDOMINAL HYSTERECTOMY    . BRAIN SURGERY    . CHOLECYSTECTOMY      Family History  Problem Relation Age of Onset  . Asthma Mother   . Hypertension Mother   . Diabetes Mother   . Diabetes Father   . Cancer Father        colon at 67 yo  . Breast cancer Neg Hx     Social History   Socioeconomic History  . Marital status: Single    Spouse name: Not on file  . Number of children: Not on file  . Years of education: Not on file  . Highest education level: Not on file  Occupational History  . Not on file  Tobacco Use  . Smoking status: Current Every Day Smoker    Packs/day: 0.50    Years: 25.00    Pack years: 12.50    Types: Cigarettes    Last attempt to quit: 03/24/2017    Years since quitting: 2.4  . Smokeless tobacco: Never Used  . Tobacco comment: Patient trying to quit  Substance and Sexual Activity  . Alcohol use: Yes    Alcohol/week: 1.0 standard drinks    Types: 1 Standard drinks or equivalent per week    Comment: rare  . Drug use: No  . Sexual activity: Not Currently    Birth control/protection: Surgical    Comment: declined condoms  Other Topics Concern  . Not on file  Social History Narrative  . Not on file   Social  Determinants of Health   Financial Resource Strain:   . Difficulty of Paying Living Expenses:   Food Insecurity:   . Worried About Charity fundraiser in the Last Year:   . Arboriculturist in the Last Year:   Transportation Needs:   . Film/video editor (Medical):   Marland Kitchen Lack of Transportation (Non-Medical):   Physical Activity:   . Days of Exercise per Week:   . Minutes of Exercise per Session:   Stress:   . Feeling of Stress :   Social Connections:   . Frequency of Communication with Friends and Family:   . Frequency of Social Gatherings with Friends and Family:   . Attends Religious Services:   . Active Member of Clubs or Organizations:   . Attends Theatre manager Meetings:   Marland Kitchen Marital Status:   Intimate Partner Violence:   . Fear of Current or Ex-Partner:   . Emotionally Abused:   Marland Kitchen Physically Abused:   . Sexually Abused:     Outpatient Medications Prior to Visit  Medication Sig Dispense Refill  . acetaminophen (TYLENOL) 325 MG tablet Take 2 tablets (650 mg total) by mouth every 6 (six) hours as needed for mild pain, fever or headache (or Fever >/= 101). 12 tablet 0  . FLUoxetine (PROZAC) 20 MG capsule Take 1 capsule (20 mg total) by mouth daily. 90 capsule 1  . ODEFSEY 200-25-25 MG TABS tablet TAKE 1 TABLET BY MOUTH DAILY WITH BREAKFAST 30 tablet 5  . benazepril (LOTENSIN) 10 MG tablet Take 1 tablet (10 mg total) by mouth daily. 30 tablet 2   No facility-administered medications prior to visit.    Allergies  Allergen Reactions  . Shellfish Allergy Anaphylaxis    Shrimp     ROS Review of Systems  All other systems reviewed and are negative.     Objective:    Physical Exam  Constitutional: She is oriented to person, place, and time. She appears well-developed.  Obese  HENT:  Head: Normocephalic.  Cardiovascular: Normal rate, regular rhythm, normal heart sounds and intact distal pulses.  Pulmonary/Chest: Effort normal and breath sounds normal.  Abdominal:  Hypoactive bowel sounds  Musculoskeletal:        General: Normal range of motion.     Cervical back: Normal range of motion.  Neurological: She is alert and oriented to person, place, and time.  Skin: Skin is warm and dry.  Psychiatric: She has a normal mood and affect. Her behavior is normal. Judgment and thought content normal.    BP (!) 142/90 Comment: NO BP MED IN 2 DAYS  Pulse 84   Temp 98.3 F (36.8 C) (Oral)   Ht 5\' 2"  (1.575 m)   Wt 213 lb 12.8 oz (97 kg)   SpO2 100%   BMI 39.10 kg/m  Wt Readings from Last 3 Encounters:  08/31/19 213 lb 12.8 oz (97 kg)  05/31/19 214 lb (97.1 kg)  02/03/19 215 lb (97.5 kg)     Health Maintenance Due   Topic Date Due  . TETANUS/TDAP  Never done  . COLONOSCOPY  Never done    There are no preventive care reminders to display for this patient.  Lab Results  Component Value Date   TSH 0.128 (L) 11/03/2018   Lab Results  Component Value Date   WBC 6.8 11/07/2018   HGB 14.6 11/07/2018   HCT 44.5 11/07/2018   MCV 85.7 11/07/2018   PLT 251 11/07/2018   Lab Results  Component Value Date   NA 141 11/07/2018   K 4.1 11/07/2018   CO2 26 11/07/2018   GLUCOSE 126 (H) 11/07/2018   BUN 9 11/07/2018   CREATININE 0.98 11/07/2018   BILITOT 0.4 11/07/2018   ALKPHOS 70 10/04/2018   AST 15 11/07/2018   ALT 15 11/07/2018   PROT 6.9 11/07/2018   ALBUMIN 3.7 10/04/2018   CALCIUM 9.4 11/07/2018   ANIONGAP 5 10/04/2018   Lab Results  Component Value Date   CHOL 203 (H) 11/07/2018   Lab Results  Component Value Date   HDL 48 (L) 11/07/2018   Lab Results  Component Value Date   LDLCALC 138 (H) 11/07/2018   Lab Results  Component Value Date   TRIG 80 11/07/2018   Lab Results  Component Value Date   CHOLHDL 4.2 11/07/2018   Lab Results  Component Value Date   HGBA1C 6.0 (A) 03/24/2018      Assessment & Plan:   Problem List Items Addressed This Visit      Unprioritized   Essential hypertension - Primary   Relevant Medications   benazepril (LOTENSIN) 10 MG tablet   Other Relevant Orders   POCT urinalysis dipstick (Completed)   Comp. Metabolic Panel (12)   Human immunodeficiency virus (HIV) disease ([place])    Other Visit Diagnoses    Healthcare maintenance       Labs pending Referral to gastroenterology   Relevant Orders   CBC with Differential/Platelet   Lipid panel   TSH   Vitamin B12   Magnesium   VITAMIN D 25 Hydroxy (Vit-D Deficiency, Fractures)   Encounter for screening colonoscopy       Screening colonoscopy pending   Relevant Orders   Ambulatory referral to Gastroenterology      Meds ordered this encounter  Medications  . benazepril (LOTENSIN) 10  MG tablet    Sig: Take 1 tablet (10 mg total) by mouth daily.    Dispense:  90 tablet    Refill:  0    Order Specific Question:   Supervising Provider    Answer:   Tresa Garter [id number]    Follow-up: Return in about 6 months (around 03/02/2020).    Vevelyn Francois, NP

## 2019-08-31 NOTE — Patient Instructions (Signed)
Colonoscopy, Adult A colonoscopy is a procedure to look at the entire large intestine. This procedure is done using a long, thin, flexible tube that has a camera on the end. You may have a colonoscopy:  As a part of normal colorectal screening.  If you have certain symptoms, such as: ? A low number of red blood cells in your blood (anemia). ? Diarrhea that does not go away. ? Pain in your abdomen. ? Blood in your stool. A colonoscopy can help screen for and diagnose medical problems, including:  Tumors.  Extra tissue that grows where mucus forms (polyps).  Inflammation.  Areas of bleeding. Tell your health care provider about:  Any allergies you have.  All medicines you are taking, including vitamins, herbs, eye drops, creams, and over-the-counter medicines.  Any problems you or family members have had with anesthetic medicines.  Any blood disorders you have.  Any surgeries you have had.  Any medical conditions you have.  Any problems you have had with having bowel movements.  Whether you are pregnant or may be pregnant. What are the risks? Generally, this is a safe procedure. However, problems may occur, including:  Bleeding.  Damage to your intestine.  Allergic reactions to medicines given during the procedure.  Infection. This is rare. What happens before the procedure? Eating and drinking restrictions Follow instructions from your health care provider about eating or drinking restrictions, which may include:  A few days before the procedure: ? Follow a low-fiber diet. ? Avoid nuts, seeds, dried fruit, raw fruits, and vegetables.  1-3 days before the procedure: ? Eat only gelatin dessert or ice pops. ? Drink only clear liquids, such as water, clear juice, clear broth or bouillon, black coffee or tea, or clear soft drinks or sports drinks. ? Avoid liquids that contain red or purple dye.  The day of the procedure: ? Do not eat solid foods. You may  continue to drink clear liquids until up to 2 hours before the procedure. ? Do not eat or drink anything starting 2 hours before the procedure, or within the time period that your health care provider recommends. Bowel prep If you were prescribed a bowel prep to take by mouth (orally) to clean out your colon:  Take it as told by your health care provider. Starting the day before your procedure, you will need to drink a large amount of liquid medicine. The liquid will cause you to have many bowel movements of loose stool until your stool becomes almost clear or light green.  If your skin or the opening between the buttocks (anus) gets irritated from diarrhea, you may relieve the irritation using: ? Wipes with medicine in them, such as adult wet wipes with aloe and vitamin E. ? A product to soothe skin, such as petroleum jelly.  If you vomit while drinking the bowel prep: ? Take a break for up to 60 minutes. ? Begin the bowel prep again. ? Call your health care provider if you keep vomiting or you cannot take the bowel prep without vomiting.  To clean out your colon, you may also be given: ? Laxative medicines. These help you have a bowel movement. ? Instructions for enema use. An enema is liquid medicine injected into your rectum. Medicines Ask your health care provider about:  Changing or stopping your regular medicines or supplements. This is especially important if you are taking iron supplements, diabetes medicines, or blood thinners.  Taking medicines such as aspirin and ibuprofen. These medicines  can thin your blood. Do not take these medicines unless your health care provider tells you to take them.  Taking over-the-counter medicines, vitamins, herbs, and supplements. General instructions  Ask your health care provider what steps will be taken to help prevent infection. These may include washing skin with a germ-killing soap.  Plan to have someone take you home from the hospital  or clinic. What happens during the procedure?   An IV will be inserted into one of your veins.  You may be given one or more of the following: ? A medicine to help you relax (sedative). ? A medicine to numb the area (local anesthetic). ? A medicine to make you fall asleep (general anesthetic). This is rarely needed.  You will lie on your side with your knees bent.  The tube will: ? Have oil or gel put on it (be lubricated). ? Be inserted into your anus. ? Be gently eased through all parts of your large intestine.  Air will be sent into your colon to keep it open. This may cause some pressure or cramping.  Images will be taken with the camera and will appear on a screen.  A small tissue sample may be removed to be looked at under a microscope (biopsy). The tissue may be sent to a lab for testing if any signs of problems are found.  If small polyps are found, they may be removed and checked for cancer cells.  When the procedure is finished, the tube will be removed. The procedure may vary among health care providers and hospitals. What happens after the procedure?  Your blood pressure, heart rate, breathing rate, and blood oxygen level will be monitored until you leave the hospital or clinic.  You may have a small amount of blood in your stool.  You may pass gas and have mild cramping or bloating in your abdomen. This is caused by the air that was used to open your colon during the exam.  Do not drive for 24 hours after the procedure.  It is up to you to get the results of your procedure. Ask your health care provider, or the department that is doing the procedure, when your results will be ready. Summary  A colonoscopy is a procedure to look at the entire large intestine.  Follow instructions from your health care provider about eating and drinking before the procedure.  If you were prescribed an oral bowel prep to clean out your colon, take it as told by your health care  provider.  During the colonoscopy, a flexible tube with a camera on its end is inserted into the anus and then passed into the other parts of the large intestine. This information is not intended to replace advice given to you by your health care provider. Make sure you discuss any questions you have with your health care provider. Document Revised: 12/16/2018 Document Reviewed: 12/16/2018 Elsevier Patient Education  [place]. Hypokalemia Hypokalemia means that the amount of potassium in the blood is lower than normal. Potassium is a chemical (electrolyte) that helps regulate the amount of fluid in the body. It also stimulates muscle tightening (contraction) and helps nerves work properly. Normally, most of the body's potassium is inside cells, and only a very small amount is in the blood. Because the amount in the blood is so small, minor changes to potassium levels in the blood can be life-threatening. What are the causes? This condition may be caused by:  Antibiotic medicine.  Diarrhea  or vomiting. Taking too much of a medicine that helps you have a bowel movement (laxative) can cause diarrhea and lead to hypokalemia.  Chronic kidney disease (CKD).  Medicines that help the body get rid of excess fluid (diuretics).  Eating disorders, such as bulimia.  Low magnesium levels in the body.  Sweating a lot. What are the signs or symptoms? Symptoms of this condition include:  Weakness.  Constipation.  Fatigue.  Muscle cramps.  Mental confusion.  Skipped heartbeats or irregular heartbeat (palpitations).  Tingling or numbness. How is this diagnosed? This condition is diagnosed with a blood test. How is this treated? This condition may be treated by:  Taking potassium supplements by mouth.  Adjusting the medicines that you take.  Eating more foods that contain a lot of potassium. If your potassium level is very low, you may need to get potassium through an IV and  be monitored in the hospital. Follow these instructions at home:   Take over-the-counter and prescription medicines only as told by your health care provider. This includes vitamins and supplements.  Eat a healthy diet. A healthy diet includes fresh fruits and vegetables, whole grains, healthy fats, and lean proteins.  If instructed, eat more foods that contain a lot of potassium. This includes: ? Nuts, such as peanuts and pistachios. ? Seeds, such as sunflower seeds and pumpkin seeds. ? Peas, lentils, and lima beans. ? Whole grain and bran cereals and breads. ? Fresh fruits and vegetables, such as apricots, avocado, bananas, cantaloupe, kiwi, oranges, tomatoes, asparagus, and potatoes. ? Orange juice. ? Tomato juice. ? Red meats. ? Yogurt.  Keep all follow-up visits as told by your health care provider. This is important. Contact a health care provider if you:  Have weakness that gets worse.  Feel your heart pounding or racing.  Vomit.  Have diarrhea.  Have diabetes (diabetes mellitus) and you have trouble keeping your blood sugar (glucose) in your target range. Get help right away if you:  Have chest pain.  Have shortness of breath.  Have vomiting or diarrhea that lasts for more than 2 days.  Faint. Summary  Hypokalemia means that the amount of potassium in the blood is lower than normal.  This condition is diagnosed with a blood test.  Hypokalemia may be treated by taking potassium supplements, adjusting the medicines that you take, or eating more foods that are high in potassium.  If your potassium level is very low, you may need to get potassium through an IV and be monitored in the hospital. This information is not intended to replace advice given to you by your health care provider. Make sure you discuss any questions you have with your health care provider. Document Revised: 01/05/2018 Document Reviewed: 01/05/2018 Elsevier Patient Education  [place].

## 2019-09-01 ENCOUNTER — Other Ambulatory Visit: Payer: Self-pay | Admitting: Nurse Practitioner

## 2019-09-01 DIAGNOSIS — E559 Vitamin D deficiency, unspecified: Secondary | ICD-10-CM

## 2019-09-01 LAB — MAGNESIUM: Magnesium: 2.1 mg/dL (ref 1.6–2.3)

## 2019-09-01 LAB — COMP. METABOLIC PANEL (12)
AST: 20 IU/L (ref 0–40)
Albumin/Globulin Ratio: 1.8 (ref 1.2–2.2)
Albumin: 4.5 g/dL (ref 3.8–4.9)
Alkaline Phosphatase: 98 IU/L (ref 39–117)
BUN/Creatinine Ratio: 9 (ref 9–23)
BUN: 8 mg/dL (ref 6–24)
Bilirubin Total: 0.4 mg/dL (ref 0.0–1.2)
Calcium: 9.4 mg/dL (ref 8.7–10.2)
Chloride: 104 mmol/L (ref 96–106)
Creatinine, Ser: 0.91 mg/dL (ref 0.57–1.00)
GFR calc Af Amer: 82 mL/min/{1.73_m2} (ref >59)
GFR calc non Af Amer: 71 mL/min/{1.73_m2} (ref >59)
Globulin, Total: 2.5 g/dL (ref 1.5–4.5)
Glucose: 107 mg/dL — ABNORMAL HIGH (ref 65–99)
Potassium: 4.4 mmol/L (ref 3.5–5.2)
Sodium: 139 mmol/L (ref 134–144)
Total Protein: 7 g/dL (ref 6.0–8.5)

## 2019-09-01 LAB — LIPID PANEL
Chol/HDL Ratio: 4.6 ratio — ABNORMAL HIGH (ref 0.0–4.4)
Cholesterol, Total: 220 mg/dL — ABNORMAL HIGH (ref 100–199)
HDL: 48 mg/dL (ref >39)
LDL Chol Calc (NIH): 152 mg/dL — ABNORMAL HIGH (ref 0–99)
Triglycerides: 114 mg/dL (ref 0–149)
VLDL Cholesterol Cal: 20 mg/dL (ref 5–40)

## 2019-09-01 LAB — CBC WITH DIFFERENTIAL/PLATELET
Basophils Absolute: 0.1 10*3/uL (ref 0.0–0.2)
Basos: 1 %
EOS (ABSOLUTE): 0 10*3/uL (ref 0.0–0.4)
Eos: 0 %
Hematocrit: 44.5 % (ref 34.0–46.6)
Hemoglobin: 14.8 g/dL (ref 11.1–15.9)
Immature Grans (Abs): 0 10*3/uL (ref 0.0–0.1)
Immature Granulocytes: 0 %
Lymphocytes Absolute: 3.6 10*3/uL — ABNORMAL HIGH (ref 0.7–3.1)
Lymphs: 47 %
MCH: 28.8 pg (ref 26.6–33.0)
MCHC: 33.3 g/dL (ref 31.5–35.7)
MCV: 87 fL (ref 79–97)
Monocytes Absolute: 0.6 10*3/uL (ref 0.1–0.9)
Monocytes: 9 %
Neutrophils Absolute: 3.2 10*3/uL (ref 1.4–7.0)
Neutrophils: 43 %
Platelets: 225 10*3/uL (ref 150–450)
RBC: 5.13 x10E6/uL (ref 3.77–5.28)
RDW: 14.1 % (ref 11.7–15.4)
WBC: 7.4 10*3/uL (ref 3.4–10.8)

## 2019-09-01 LAB — TSH: TSH: 0.481 u[IU]/mL (ref 0.450–4.500)

## 2019-09-01 LAB — VITAMIN D 25 HYDROXY (VIT D DEFICIENCY, FRACTURES): Vit D, 25-Hydroxy: 12.7 ng/mL — ABNORMAL LOW (ref 30.0–100.0)

## 2019-09-01 LAB — VITAMIN B12: Vitamin B-12: 426 pg/mL (ref 232–1245)

## 2019-09-01 MED ORDER — ERGOCALCIFEROL 1.25 MG (50000 UT) PO CAPS: 50000.0000 [IU] | ORAL_CAPSULE | ORAL | 0 refills | Status: DC

## 2019-09-09 ENCOUNTER — Other Ambulatory Visit: Payer: Self-pay | Admitting: Internal Medicine

## 2019-09-09 DIAGNOSIS — F4310 Post-traumatic stress disorder, unspecified: Secondary | ICD-10-CM

## 2019-09-21 ENCOUNTER — Other Ambulatory Visit: Payer: Managed Care, Other (non HMO)

## 2019-09-21 ENCOUNTER — Other Ambulatory Visit: Payer: Self-pay

## 2019-09-21 ENCOUNTER — Other Ambulatory Visit (HOSPITAL_COMMUNITY)
Admission: RE | Admit: 2019-09-21 | Discharge: 2019-09-21 | Disposition: A | Discharge status: Home / Self Care | Payer: Managed Care, Other (non HMO) | Source: Ambulatory Visit | Attending: Infectious Diseases | Admitting: Infectious Diseases

## 2019-09-21 ENCOUNTER — Encounter (INDEPENDENT_AMBULATORY_CARE_PROVIDER_SITE_OTHER): Payer: Self-pay | Admitting: *Deleted

## 2019-09-21 VITALS — BP 132/84 | HR 81 | Temp 98.6°F | Wt 214.5 lb

## 2019-09-21 DIAGNOSIS — Z006 Encounter for examination for normal comparison and control in clinical research program: Secondary | ICD-10-CM

## 2019-09-21 DIAGNOSIS — B20 Human immunodeficiency virus [HIV] disease: Secondary | ICD-10-CM

## 2019-09-21 DIAGNOSIS — Z113 Encounter for screening for infections with a predominantly sexual mode of transmission: Secondary | ICD-10-CM | POA: Insufficient documentation

## 2019-09-21 NOTE — Addendum Note (Signed)
Addended byMeriel Pica F on: 09/21/2019 09:01 AM   Modules accepted: Orders

## 2019-09-21 NOTE — Research (Signed)
Shannon [name] is here for her Month 43 Reprieve visit. States that she has been having some diarrhea since last week. Usually occurs once a day but she says that there has been days it occurred 2-3 times. Denied any nausea, vomiting or abdominal discomfort. Encouraged her hydrate and to watch her diet for foods that may be contributing to symptoms. She has also been experiencing intermittent leg cramps especially at night since January. We discussed eating some foods higher in potassium to see if that may help. She is scheduled to see Dr. Johnnye Sima 4/29. Verbalized excellent adherence with her medications. She will return in August for her next study visit.

## 2019-09-22 LAB — URINE CYTOLOGY ANCILLARY ONLY
Chlamydia: NEGATIVE
Comment: NEGATIVE
Comment: NORMAL
Neisseria Gonorrhea: NEGATIVE

## 2019-09-22 LAB — T-HELPER CELL (CD4) - (RCID CLINIC ONLY)
CD4 % Helper T Cell: 36 % (ref 33–65)
CD4 T Cell Abs: 1048 /uL (ref 400–1790)

## 2019-09-24 LAB — RPR: RPR Ser Ql: NONREACTIVE

## 2019-09-24 LAB — COMPREHENSIVE METABOLIC PANEL
AG Ratio: 1.5 (calc) (ref 1.0–2.5)
ALT: 15 U/L (ref 6–29)
AST: 15 U/L (ref 10–35)
Albumin: 4.1 g/dL (ref 3.6–5.1)
Alkaline phosphatase (APISO): 78 U/L (ref 37–153)
BUN: 13 mg/dL (ref 7–25)
CO2: 26 mmol/L (ref 20–32)
Calcium: 9.5 mg/dL (ref 8.6–10.4)
Chloride: 109 mmol/L (ref 98–110)
Creat: 0.98 mg/dL (ref 0.50–1.05)
Globulin: 2.7 g/dL (calc) (ref 1.9–3.7)
Glucose, Bld: 148 mg/dL — ABNORMAL HIGH (ref 65–99)
Potassium: 4.5 mmol/L (ref 3.5–5.3)
Sodium: 144 mmol/L (ref 135–146)
Total Bilirubin: 0.3 mg/dL (ref 0.2–1.2)
Total Protein: 6.8 g/dL (ref 6.1–8.1)

## 2019-09-24 LAB — HIV-1 RNA QUANT-NO REFLEX-BLD
HIV 1 RNA Quant: <20 copies/mL — AB
HIV-1 RNA Quant, Log: <1.3 Log copies/mL — AB

## 2019-09-24 LAB — CBC
HCT: 44.2 % (ref 35.0–45.0)
Hemoglobin: 14.6 g/dL (ref 11.7–15.5)
MCH: 28.6 pg (ref 27.0–33.0)
MCHC: 33 g/dL (ref 32.0–36.0)
MCV: 86.7 fL (ref 80.0–100.0)
MPV: 9.8 fL (ref 7.5–12.5)
Platelets: 242 10*3/uL (ref 140–400)
RBC: 5.1 10*6/uL (ref 3.80–5.10)
RDW: 13.9 % (ref 11.0–15.0)
WBC: 6.2 10*3/uL (ref 3.8–10.8)

## 2019-10-05 ENCOUNTER — Encounter: Payer: Managed Care, Other (non HMO) | Admitting: Infectious Diseases

## 2019-10-10 ENCOUNTER — Ambulatory Visit (INDEPENDENT_AMBULATORY_CARE_PROVIDER_SITE_OTHER): Payer: 59 | Admitting: Infectious Diseases

## 2019-10-10 ENCOUNTER — Other Ambulatory Visit: Payer: Self-pay

## 2019-10-10 ENCOUNTER — Encounter: Payer: Self-pay | Admitting: Infectious Diseases

## 2019-10-10 VITALS — BP 151/102 | HR 84 | Wt 216.0 lb

## 2019-10-10 DIAGNOSIS — I10 Essential (primary) hypertension: Secondary | ICD-10-CM | POA: Diagnosis not present

## 2019-10-10 DIAGNOSIS — Z79899 Other long term (current) drug therapy: Secondary | ICD-10-CM | POA: Diagnosis not present

## 2019-10-10 DIAGNOSIS — B20 Human immunodeficiency virus [HIV] disease: Secondary | ICD-10-CM

## 2019-10-10 DIAGNOSIS — E041 Nontoxic single thyroid nodule: Secondary | ICD-10-CM

## 2019-10-10 DIAGNOSIS — Z113 Encounter for screening for infections with a predominantly sexual mode of transmission: Secondary | ICD-10-CM

## 2019-10-10 NOTE — Assessment & Plan Note (Signed)
Needs PAP, mammo, colon (she has gotten a call from them).  Will give her info on COVID vax Has condoms and boyfriend.  Will see her back in 9 months.

## 2019-10-10 NOTE — Assessment & Plan Note (Signed)
Having a "no good day" with multiple stressors. Is still taking ACE-I.  Appreciate PCP f/u.

## 2019-10-10 NOTE — Assessment & Plan Note (Signed)
>>  ASSESSMENT AND PLAN FOR THYROID  NODULE WRITTEN ON 10/10/2019  4:01 PM BY HATCHER, JEFFREY C, MD  Will get her in with endo.  Her last TSH was normal.

## 2019-10-10 NOTE — Progress Notes (Signed)
   Subjective:    Patient ID: Shannon [name], female    DOB: [birth year], 54 y.o.   MRN: [id number]  HPI 54yo F HIV+, previous Chairi 1 repair (2000). Prev taking atripla, changed to [facility] 2016. Takes with food.  Has not had PAP or colon (scheduled, rescheduled) She had thyroid scan on 4-5-18showing:Multinodular thyroid gland demonstrating small warm and cold nodules in both lobes. Last endo appt ? Working from home.  Has not seen psychiatry for awhile, ? Has not had her colon/mammo/pap since last visit.   Wt has gone up, has been walking, doing stretches. Watching her diet.   Has not got COVID vax.   HIV 1 RNA Quant (copies/mL)  Date Value  09/21/2019 <20 DETECTED (A)  11/07/2018 <20 NOT DETECTED  10/27/2017 <20 DETECTED (A)   CD4 T Cell Abs (/uL)  Date Value  09/21/2019 1,048  11/07/2018 1,032  10/27/2017 1,230    Review of Systems  Constitutional: Negative for appetite change and unexpected weight change.  Cardiovascular: Negative for chest pain.  Gastrointestinal: Negative for constipation and diarrhea.  Genitourinary: Negative for difficulty urinating.  Neurological: Negative for headaches.       Objective:   Physical Exam Vitals reviewed.  Constitutional:      Appearance: Normal appearance. She is obese.  HENT:     Mouth/Throat:     Mouth: Mucous membranes are moist.     Pharynx: No oropharyngeal exudate.  Eyes:     Extraocular Movements: Extraocular movements intact.     Pupils: Pupils are equal, round, and reactive to light.  Cardiovascular:     Rate and Rhythm: Normal rate and regular rhythm.  Pulmonary:     Effort: Pulmonary effort is normal.     Breath sounds: Normal breath sounds.  Abdominal:     General: Bowel sounds are normal. There is no distension.     Palpations: Abdomen is soft.     Tenderness: There is no abdominal tenderness.  Musculoskeletal:        General: Normal range of motion.     Cervical back: Normal range of motion  and neck supple.     Right lower leg: No edema.     Left lower leg: No edema.  Neurological:     General: No focal deficit present.     Mental Status: She is alert.  Psychiatric:        Mood and Affect: Mood normal.           Assessment & Plan:

## 2019-10-10 NOTE — Assessment & Plan Note (Signed)
Will get her in with endo.  Her last TSH was normal.

## 2019-10-20 ENCOUNTER — Other Ambulatory Visit: Payer: Self-pay

## 2019-10-23 ENCOUNTER — Telehealth: Payer: Self-pay | Admitting: Endocrinology

## 2019-10-23 NOTE — Telephone Encounter (Signed)
Has the patient requested a switch.  Is not going to be a new patient visit anyway

## 2019-10-23 NOTE — Telephone Encounter (Signed)
Left a message for the pt for clarity

## 2019-10-23 NOTE — Telephone Encounter (Signed)
FYI

## 2019-10-23 NOTE — Telephone Encounter (Signed)
We received a referral for this pt, she was last seen by Dr. Dwyane Dee on 04/25/18 so she is not a new pt. Dr Dwyane Dee do you want [place] to change her to you?

## 2019-10-23 NOTE — Telephone Encounter (Signed)
Disregard, wrong question asked. Is it ok to switch the pt to Dr. Loanne Drilling?

## 2019-10-24 ENCOUNTER — Other Ambulatory Visit: Payer: Self-pay

## 2019-10-24 ENCOUNTER — Other Ambulatory Visit (INDEPENDENT_AMBULATORY_CARE_PROVIDER_SITE_OTHER): Payer: Managed Care, Other (non HMO)

## 2019-10-24 ENCOUNTER — Other Ambulatory Visit: Payer: Self-pay | Admitting: Endocrinology

## 2019-10-24 ENCOUNTER — Ambulatory Visit: Payer: 59 | Admitting: Endocrinology

## 2019-10-24 DIAGNOSIS — E042 Nontoxic multinodular goiter: Secondary | ICD-10-CM | POA: Diagnosis not present

## 2019-10-24 NOTE — Patient Instructions (Signed)
[id number] 

## 2019-10-24 NOTE — Telephone Encounter (Signed)
FYI

## 2019-10-24 NOTE — Telephone Encounter (Signed)
Pt did not want to switch, she is now set for labs and a visit for Dr. Dwyane Dee this week

## 2019-10-25 LAB — T3, FREE: T3, Free: 4 pg/mL (ref 2.3–4.2)

## 2019-10-25 LAB — T4, FREE: Free T4: 0.92 ng/dL (ref 0.60–1.60)

## 2019-10-25 LAB — TSH: TSH: 0.61 u[IU]/mL (ref 0.35–4.50)

## 2019-10-27 ENCOUNTER — Ambulatory Visit: Payer: 59 | Admitting: Endocrinology

## 2019-11-03 ENCOUNTER — Encounter: Payer: Self-pay | Admitting: Endocrinology

## 2019-11-03 ENCOUNTER — Ambulatory Visit: Payer: 59 | Admitting: Endocrinology

## 2019-11-03 ENCOUNTER — Other Ambulatory Visit: Payer: Self-pay

## 2019-11-03 VITALS — BP 144/72 | HR 80 | Ht 62.0 in | Wt 217.2 lb

## 2019-11-03 DIAGNOSIS — E042 Nontoxic multinodular goiter: Secondary | ICD-10-CM | POA: Diagnosis not present

## 2019-11-03 DIAGNOSIS — I10 Essential (primary) hypertension: Secondary | ICD-10-CM

## 2019-11-03 NOTE — Progress Notes (Signed)
Patient ID: Shannon [name], female   DOB: [birth year], 54 y.o.   MRN: [id number]             Reason for Appointment: Goiter, follow-up visit    History of Present Illness:   She was first seen in 08/2016 for her goiter  The patient's thyroid enlargement was first discovered in 2011, probably on a routine physical exam She initially was evaluated by a thyroid ultrasound and also at that time she had needle aspiration biopsy on the dominant nodules on each side, Previous biopsy of right-sided nodule showed predominantly microfollicular pattern.  Since she had a low normal TSH and a nuclear scan was done in 2018 which showed small areas of warm and cold activity without any dominant focus  RECENT history:  Her PCP has referred her for follow-up for repeat consultation She was last seen in 04/2018  She is now complaining of increased sensation of pressure and tightness in her lower neck especially with certain movements of her head and when trying to lie down to sleep Still has occasional choking while eating or drinking Also at times has difficulty swallowing  She did have transient subclinical hyperthyroidism in 10/2018 but subsequently thyroid labs have been normal  Lab Results  Component Value Date   FREET4 0.92 10/24/2019   FREET4 0.79 08/28/2016   FREET4 1.0 06/29/2016   TSH 0.61 10/24/2019   TSH 0.481 08/31/2019   TSH 0.128 (L) 11/03/2018    She has had an ultrasound exam in 07/2016 with the following results:  Single RIGHT solid nodule measuring 3.0 cm; Other 2 dimensions: 1.9 cm x 2.3 cm) and single LEFT (designated #3) thyroid nodule meet criteria for biopsy, as designated by the newly established ACR TI-RADS criteria, and referral for biopsy is recommended.  The nodules show increase in size compared to the ultrasound in 2014, at that time the left-sided nodule measured 3 cm x 2.5 cm and now this measures 3.4 cm x 3.3 cm.  Previous management of the right-sided  nodule was 2.1 x 1.9 cm  Thyroid biopsy in 08/2009  THYROID, FINE NEEDLE ASPIRATION, RIGHT: Follicular epithelium in a predominantly microfollicular pattern. Based on this material, a follicular neoplasm can not be entirely ruled out.  THYROID,FINE NEEDLE ASPIRATON,LEFT: Benign. Findings consistent with a non-neoplastic goiter.   Allergies as of 11/03/2019      Reactions   Shellfish Allergy Anaphylaxis   Shrimp      Medication List       Accurate as of Nov 03, 2019  2:53 PM. If you have any questions, ask your nurse or doctor.        acetaminophen 325 MG tablet Commonly known as: TYLENOL Take 2 tablets (650 mg total) by mouth every 6 (six) hours as needed for mild pain, fever or headache (or Fever >/= 101).   benazepril 10 MG tablet Commonly known as: LOTENSIN Take 1 tablet (10 mg total) by mouth daily.   ergocalciferol 1.25 MG (50000 UT) capsule Commonly known as: VITAMIN D2 Take 1 capsule (50,000 Units total) by mouth once a week. X 12 weeks.   FLUoxetine 20 MG capsule Commonly known as: PROZAC TAKE 1 CAPSULE DAILY   Odefsey 200-25-25 MG Tabs tablet Generic drug: emtricitabine-rilpivir-tenofovir AF TAKE 1 TABLET BY MOUTH DAILY WITH BREAKFAST       Allergies:  Allergies  Allergen Reactions  . Shellfish Allergy Anaphylaxis    Shrimp     Past Medical History:  Diagnosis Date  . Arnold-Chiari  malformation ([place])   . DVT (deep venous thrombosis) ([place])   . HIV disease ([place])   . Hypertension   . Multiple thyroid nodules    Fully functioning thyroid with no treatment indicated    Past Surgical History:  Procedure Laterality Date  . ABDOMINAL HYSTERECTOMY    . BRAIN SURGERY    . CHOLECYSTECTOMY      Family History  Problem Relation Age of Onset  . Asthma Mother   . Hypertension Mother   . Diabetes Mother   . Diabetes Father   . Cancer Father        colon at 50 yo  . Breast cancer Neg Hx     Social History:  reports that she has been  smoking cigarettes. She has a 12.50 pack-year smoking history. She has never used smokeless tobacco. She reports current alcohol use of about 1.0 standard drinks of alcohol per week. She reports that she does not use drugs.    Review of Systems   She has had hypertension treated by her PCP with variable control  BP Readings from Last 3 Encounters:  11/03/19 (!) 144/72  10/10/19 (!) 151/102  09/21/19 132/84   Recent weight is about the same  Wt Readings from Last 3 Encounters:  11/03/19 217 lb 3.2 oz (98.5 kg)  10/10/19 216 lb (98 kg)  09/21/19 214 lb 8 oz (97.3 kg)    Examination:   BP (!) 144/72 (BP Location: Left Arm, Patient Position: Sitting, Cuff Size: Large)   Pulse 80   Ht 5\' 2"  (1.575 m)   Wt 217 lb 3.2 oz (98.5 kg)   SpO2 97%   BMI 39.73 kg/m   Right lobe is palpable mostly in the lateral lobe, about 2 times normal and relatively firm Left lobe is enlarged about 3 times normal, smooth and relatively firm without distinct nodule outlined. Isthmus is not clearly palpable but left lobe extends towards the isthmus Neck circumference over the thyroid is 43 cm Trachea appears slightly deviated to the right  There is no stridor.  Pemberton sign is positive and she has a choking on raising her hands over her head There is no lymphadenopathy anteriorly and laterally.     No tremor  Assessment/Plan:  Multinodular goiter, long-standing, since about 2011  Previous consultation notes and outside records as well as her progress was reviewed in detail  Largest nodule 3.4 cm on the left previously She has had biopsy on the nodules in 2011 and showed  microfollicular pattern on the right side She is now complaining of increased pressure and tightness in her neck and some discomfort with certain positions or movement of her neck.  Also has a little difficulty swallowing  On exam her nodules and thyroid size appears to be same; neck circumference is almost the same Some of  her thyroid enlargement may be substernal  Thyroid levels are normal although previously had transiently decreased TSH Because of this she is not a candidate for I-131 treatment  Discussed the need for consideration of surgical removal of the thyroid to relieve her symptoms Today showed her the her anatomical model of the thyroid and potential for compression of trachea and esophagus  Patient is willing to do this but wants to think about it more and also discussed with surgeon Explained to her that she will likely need a total thyroidectomy Also explained the small risks of hypoparathyroidism and rare laryngeal nerve injury.  She wants to wait at least a month to  schedule the surgery and she will let us know when she is ready, will likely refer her at that time  Hypertension: Blood pressure is better today although has been somewhat labile in the past, to continue with PCP  Elayne Snare 11/03/2019

## 2019-11-19 ENCOUNTER — Other Ambulatory Visit: Payer: Self-pay | Admitting: Nurse Practitioner

## 2019-11-19 DIAGNOSIS — E559 Vitamin D deficiency, unspecified: Secondary | ICD-10-CM

## 2020-01-12 ENCOUNTER — Telehealth: Payer: Self-pay

## 2020-01-12 DIAGNOSIS — I10 Essential (primary) hypertension: Secondary | ICD-10-CM

## 2020-01-12 MED ORDER — BENAZEPRIL HCL 10 MG PO TABS: 10.0000 mg | ORAL_TABLET | Freq: Every day | ORAL | 0 refills | Status: DC

## 2020-01-12 NOTE — Telephone Encounter (Signed)
Refill for benazepril sent to pharmacy.

## 2020-01-15 ENCOUNTER — Other Ambulatory Visit: Payer: Self-pay

## 2020-01-15 DIAGNOSIS — I10 Essential (primary) hypertension: Secondary | ICD-10-CM

## 2020-01-15 MED ORDER — BENAZEPRIL HCL 10 MG PO TABS: 10.0000 mg | ORAL_TABLET | Freq: Every day | ORAL | 0 refills | Status: DC

## 2020-01-18 ENCOUNTER — Other Ambulatory Visit: Payer: Self-pay

## 2020-01-18 DIAGNOSIS — I10 Essential (primary) hypertension: Secondary | ICD-10-CM

## 2020-01-18 MED ORDER — BENAZEPRIL HCL 10 MG PO TABS: 10.0000 mg | ORAL_TABLET | Freq: Every day | ORAL | 0 refills | Status: DC

## 2020-01-19 ENCOUNTER — Telehealth: Payer: Self-pay | Admitting: Family Medicine

## 2020-01-19 NOTE — Telephone Encounter (Signed)
Done

## 2020-01-22 ENCOUNTER — Encounter: Payer: 59 | Admitting: *Deleted

## 2020-01-24 ENCOUNTER — Encounter (INDEPENDENT_AMBULATORY_CARE_PROVIDER_SITE_OTHER): Payer: Self-pay | Admitting: *Deleted

## 2020-01-24 ENCOUNTER — Other Ambulatory Visit: Payer: Self-pay

## 2020-01-24 VITALS — BP 142/89 | HR 78 | Temp 98.5°F | Wt 221.0 lb

## 2020-01-24 DIAGNOSIS — Z006 Encounter for examination for normal comparison and control in clinical research program: Secondary | ICD-10-CM

## 2020-01-24 NOTE — Research (Signed)
Shannon [name] is here for her Month 5 Reprieve visit. No new complaints or concerns. Denied any muscle aches or weakness. She did get the Alphonsa Overall Covid-19 vaccine on 11/16/19. Verbalized excellent adherence with her ARV and study medications. She will return in December for her next study visit.

## 2020-02-27 ENCOUNTER — Other Ambulatory Visit: Payer: Self-pay | Admitting: Family Medicine

## 2020-02-27 DIAGNOSIS — I10 Essential (primary) hypertension: Secondary | ICD-10-CM

## 2020-02-28 ENCOUNTER — Ambulatory Visit: Payer: Self-pay | Admitting: Nurse Practitioner

## 2020-03-01 ENCOUNTER — Ambulatory Visit: Payer: Self-pay | Admitting: Nurse Practitioner

## 2020-03-04 ENCOUNTER — Other Ambulatory Visit: Payer: Managed Care, Other (non HMO)

## 2020-03-04 ENCOUNTER — Other Ambulatory Visit: Payer: Self-pay | Admitting: Endocrinology

## 2020-03-04 DIAGNOSIS — E042 Nontoxic multinodular goiter: Secondary | ICD-10-CM

## 2020-03-07 ENCOUNTER — Ambulatory Visit: Payer: Managed Care, Other (non HMO) | Admitting: Endocrinology

## 2020-03-12 ENCOUNTER — Other Ambulatory Visit: Payer: Self-pay | Admitting: Infectious Diseases

## 2020-03-12 DIAGNOSIS — B20 Human immunodeficiency virus [HIV] disease: Secondary | ICD-10-CM

## 2020-03-27 ENCOUNTER — Other Ambulatory Visit: Payer: Managed Care, Other (non HMO)

## 2020-03-27 ENCOUNTER — Ambulatory Visit: Payer: Managed Care, Other (non HMO) | Admitting: Endocrinology

## 2020-04-08 ENCOUNTER — Ambulatory Visit: Payer: Managed Care, Other (non HMO) | Admitting: Endocrinology

## 2020-04-08 ENCOUNTER — Other Ambulatory Visit: Payer: Self-pay | Admitting: Nurse Practitioner

## 2020-04-08 DIAGNOSIS — I10 Essential (primary) hypertension: Secondary | ICD-10-CM

## 2020-05-22 ENCOUNTER — Encounter: Payer: 59 | Admitting: *Deleted

## 2020-05-27 ENCOUNTER — Encounter: Payer: 59 | Admitting: *Deleted

## 2020-05-29 IMAGING — CR CHEST - 2 VIEW
2 series · 2 of 2 positions shown · non-contrast
Comparison: August 26, 2017

CLINICAL DATA: Shortness of breath and chest heaviness

EXAM:
CHEST - 2 VIEW

[w chest pa]
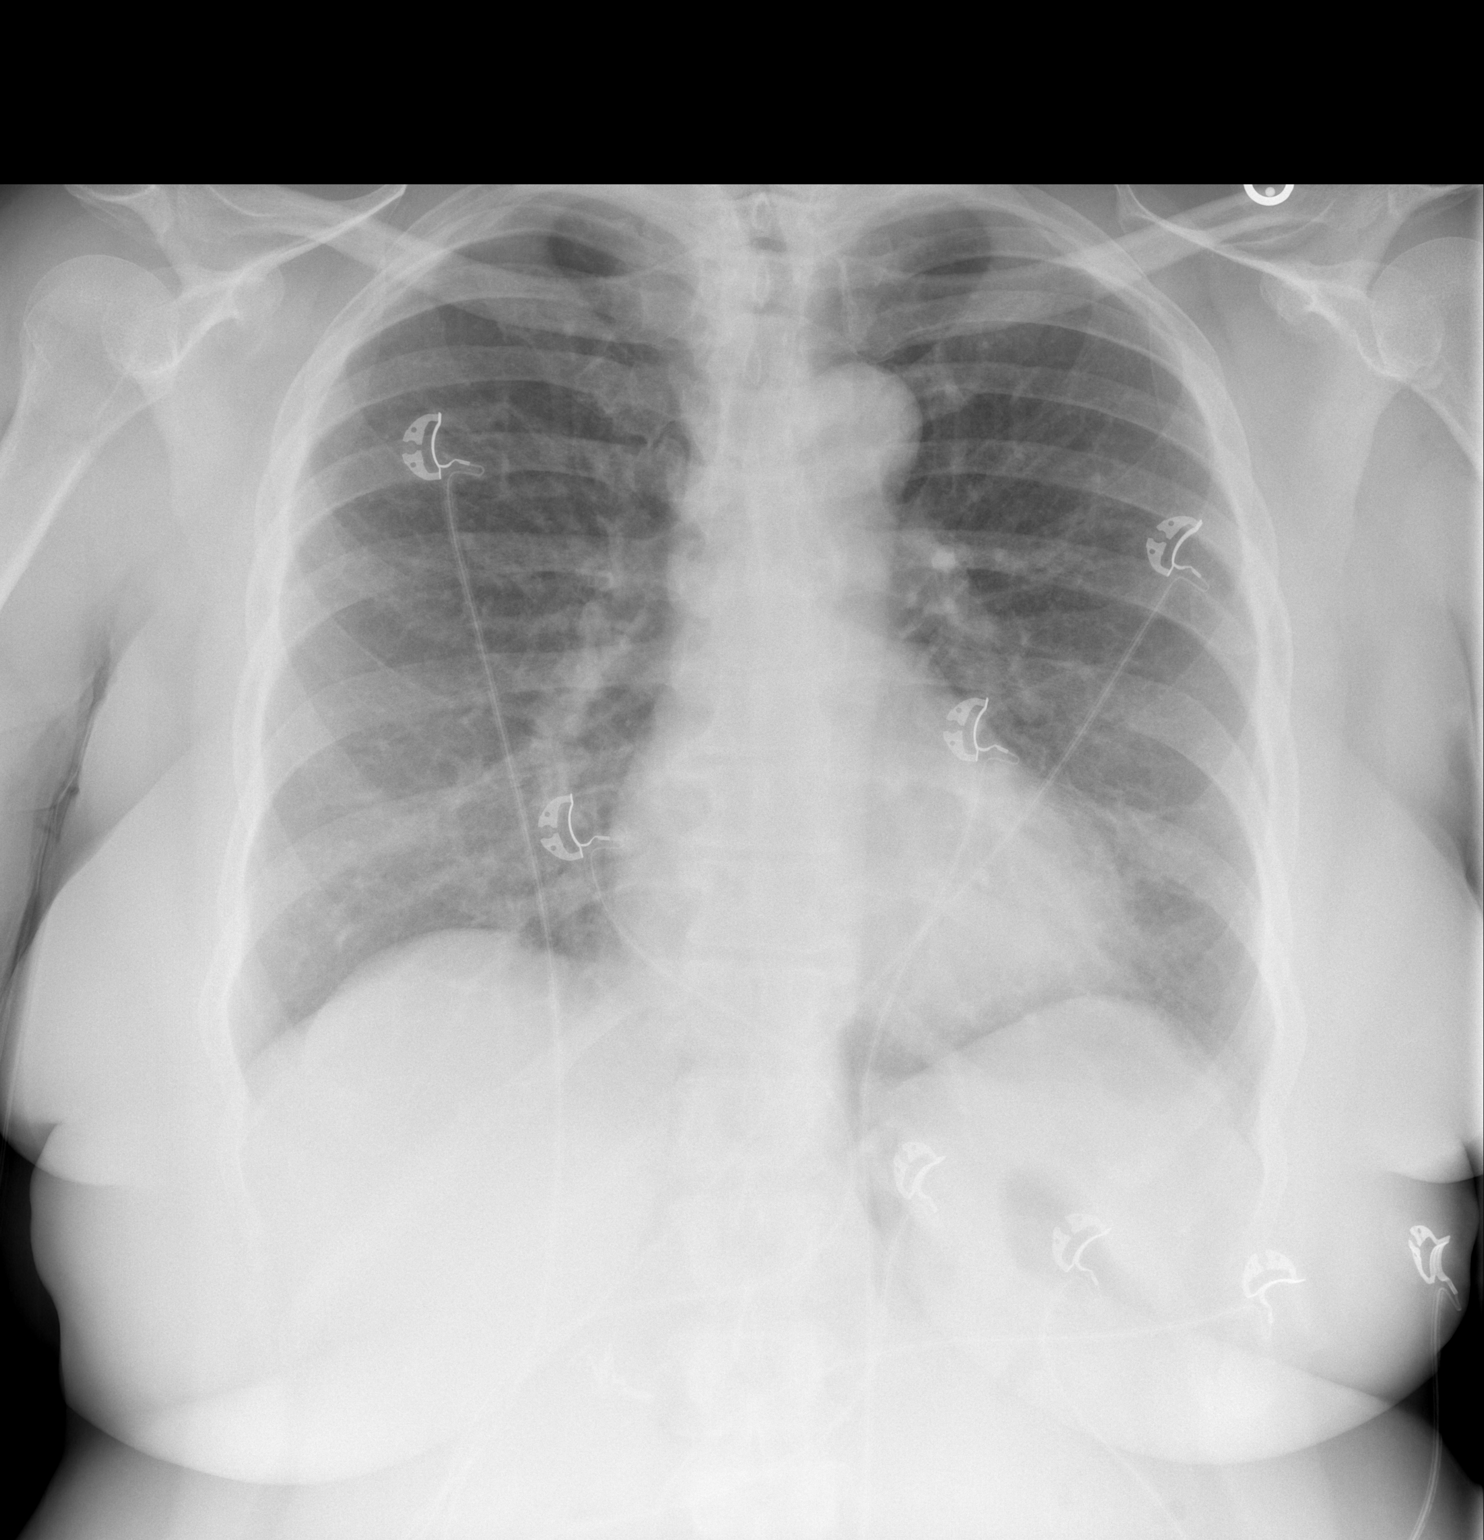

[w chest lat]
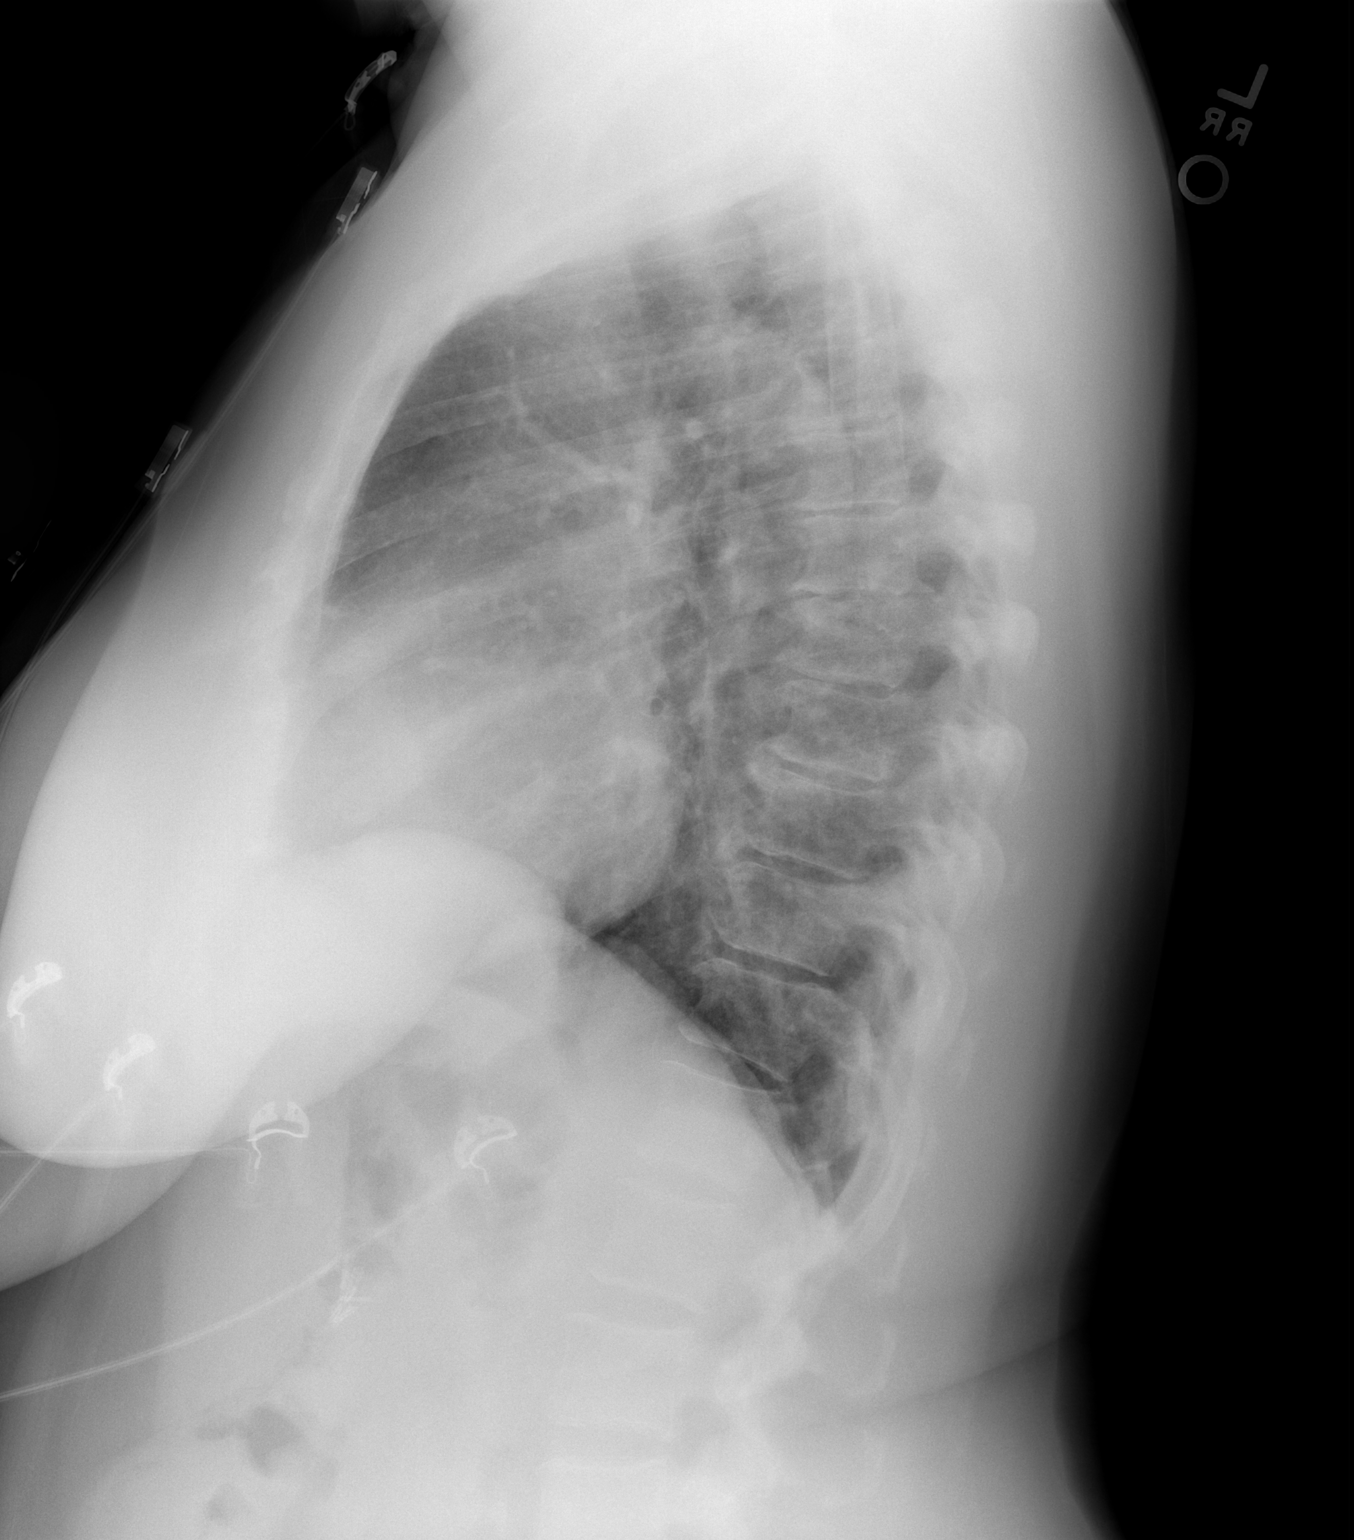

[2 of 2 positions shown; findings below may reference images not displayed]

FINDINGS: No edema or consolidation. Heart size and pulmonary vascularity are
normal. No adenopathy. No bone lesions.
IMPRESSION: No edema or consolidation.

## 2020-07-03 ENCOUNTER — Encounter (INDEPENDENT_AMBULATORY_CARE_PROVIDER_SITE_OTHER): Payer: Self-pay | Admitting: *Deleted

## 2020-07-03 ENCOUNTER — Ambulatory Visit: Payer: Managed Care, Other (non HMO) | Attending: Internal Medicine

## 2020-07-03 ENCOUNTER — Other Ambulatory Visit (HOSPITAL_COMMUNITY): Payer: Self-pay | Admitting: Internal Medicine

## 2020-07-03 ENCOUNTER — Other Ambulatory Visit: Payer: Self-pay

## 2020-07-03 VITALS — BP 139/93 | HR 82 | Temp 98.8°F | Wt 216.4 lb

## 2020-07-03 DIAGNOSIS — Z006 Encounter for examination for normal comparison and control in clinical research program: Secondary | ICD-10-CM

## 2020-07-03 DIAGNOSIS — Z23 Encounter for immunization: Secondary | ICD-10-CM

## 2020-07-03 NOTE — Addendum Note (Signed)
Addended by: Georges Lynch C on: 07/03/2020 12:12 PM   Modules accepted: Orders

## 2020-07-03 NOTE — Progress Notes (Signed)
   Covid-19 Vaccination Clinic  Name:  SALONI LABLANC    MRN: [id number] DOB: 10-23-1965  07/03/2020  Ms. [place] was observed post Covid-19 immunization for 15 minutes without incident. She was provided with Vaccine Information Sheet and instruction to access the V-Safe system.   Ms. Antunes was instructed to call 911 with any severe reactions post vaccine: Marland Kitchen Difficulty breathing  . Swelling of face and throat  . A fast heartbeat  . A bad rash all over body  . Dizziness and weakness   Immunizations Administered    Name Date Dose VIS Date Route   Pfizer COVID-19 Vaccine 07/03/2020 11:41 AM 0.3 mL 03/27/2020 Intramuscular   Manufacturer: [place]   Lot: [id number]   NDC: [address]-5

## 2020-07-03 NOTE — Research (Signed)
Shannon [name] was here for her month 36 visit for Reprieve, A Randomized Trial to Prevent Vascular Events in HIV (study drug is Pitavastatin 4mg  or placebo).   She denies any new problems and says she feels good. Her adherence is very good with study meds. She is interested in the Hep B vaccine study, but we need a hep B surface antibody checked first to see if she would qualify and I have added it to her next clinic labs.

## 2020-07-23 ENCOUNTER — Other Ambulatory Visit: Payer: 59

## 2020-07-23 ENCOUNTER — Other Ambulatory Visit: Payer: Self-pay

## 2020-07-23 DIAGNOSIS — Z79899 Other long term (current) drug therapy: Secondary | ICD-10-CM

## 2020-07-23 DIAGNOSIS — Z113 Encounter for screening for infections with a predominantly sexual mode of transmission: Secondary | ICD-10-CM

## 2020-07-23 DIAGNOSIS — B20 Human immunodeficiency virus [HIV] disease: Secondary | ICD-10-CM

## 2020-07-24 LAB — T-HELPER CELL (CD4) - (RCID CLINIC ONLY)
CD4 % Helper T Cell: 37 % (ref 33–65)
CD4 T Cell Abs: 1247 /uL (ref 400–1790)

## 2020-07-25 LAB — RPR: RPR Ser Ql: REACTIVE — AB

## 2020-07-25 LAB — COMPREHENSIVE METABOLIC PANEL
AG Ratio: 1.6 (calc) (ref 1.0–2.5)
ALT: 18 U/L (ref 6–29)
AST: 14 U/L (ref 10–35)
Albumin: 4.2 g/dL (ref 3.6–5.1)
Alkaline phosphatase (APISO): 78 U/L (ref 37–153)
BUN: 9 mg/dL (ref 7–25)
CO2: 29 mmol/L (ref 20–32)
Calcium: 9.6 mg/dL (ref 8.6–10.4)
Chloride: 105 mmol/L (ref 98–110)
Creat: 1.02 mg/dL (ref 0.50–1.05)
Globulin: 2.6 g/dL (calc) (ref 1.9–3.7)
Glucose, Bld: 147 mg/dL — ABNORMAL HIGH (ref 65–99)
Potassium: 4.3 mmol/L (ref 3.5–5.3)
Sodium: 142 mmol/L (ref 135–146)
Total Bilirubin: 0.5 mg/dL (ref 0.2–1.2)
Total Protein: 6.8 g/dL (ref 6.1–8.1)

## 2020-07-25 LAB — CBC
HCT: 43.5 % (ref 35.0–45.0)
Hemoglobin: 14.6 g/dL (ref 11.7–15.5)
MCH: 28.6 pg (ref 27.0–33.0)
MCHC: 33.6 g/dL (ref 32.0–36.0)
MCV: 85.3 fL (ref 80.0–100.0)
MPV: 9.5 fL (ref 7.5–12.5)
Platelets: 238 10*3/uL (ref 140–400)
RBC: 5.1 10*6/uL (ref 3.80–5.10)
RDW: 13.7 % (ref 11.0–15.0)
WBC: 6.7 10*3/uL (ref 3.8–10.8)

## 2020-07-25 LAB — LIPID PANEL
Cholesterol: 215 mg/dL — ABNORMAL HIGH (ref <200)
HDL: 47 mg/dL — ABNORMAL LOW (ref >=50)
LDL Cholesterol (Calc): 139 mg/dL (calc) — ABNORMAL HIGH
Non-HDL Cholesterol (Calc): 168 mg/dL (calc) — ABNORMAL HIGH (ref <130)
Total CHOL/HDL Ratio: 4.6 (calc) (ref <5.0)
Triglycerides: 159 mg/dL — ABNORMAL HIGH (ref <150)

## 2020-07-25 LAB — HIV-1 RNA QUANT-NO REFLEX-BLD
HIV 1 RNA Quant: <20 Copies/mL
HIV-1 RNA Quant, Log: <1.3 Log cps/mL

## 2020-07-25 LAB — RPR TITER: RPR Titer: 1:1 {titer} — ABNORMAL HIGH

## 2020-07-25 LAB — FLUORESCENT TREPONEMAL AB(FTA)-IGG-BLD: Fluorescent Treponemal ABS: NONREACTIVE

## 2020-08-08 ENCOUNTER — Encounter: Payer: Self-pay | Admitting: Infectious Diseases

## 2020-08-08 ENCOUNTER — Ambulatory Visit (INDEPENDENT_AMBULATORY_CARE_PROVIDER_SITE_OTHER): Payer: Managed Care, Other (non HMO) | Admitting: Infectious Diseases

## 2020-08-08 ENCOUNTER — Other Ambulatory Visit: Payer: Self-pay

## 2020-08-08 VITALS — BP 146/102 | HR 95 | Temp 98.4°F | Ht 62.75 in | Wt 219.0 lb

## 2020-08-08 DIAGNOSIS — F17200 Nicotine dependence, unspecified, uncomplicated: Secondary | ICD-10-CM

## 2020-08-08 DIAGNOSIS — I10 Essential (primary) hypertension: Secondary | ICD-10-CM | POA: Diagnosis not present

## 2020-08-08 DIAGNOSIS — Z79899 Other long term (current) drug therapy: Secondary | ICD-10-CM

## 2020-08-08 DIAGNOSIS — Z113 Encounter for screening for infections with a predominantly sexual mode of transmission: Secondary | ICD-10-CM | POA: Diagnosis not present

## 2020-08-08 DIAGNOSIS — E669 Obesity, unspecified: Secondary | ICD-10-CM

## 2020-08-08 DIAGNOSIS — R739 Hyperglycemia, unspecified: Secondary | ICD-10-CM

## 2020-08-08 DIAGNOSIS — M79671 Pain in right foot: Secondary | ICD-10-CM | POA: Insufficient documentation

## 2020-08-08 DIAGNOSIS — B20 Human immunodeficiency virus [HIV] disease: Secondary | ICD-10-CM

## 2020-08-08 DIAGNOSIS — E010 Iodine-deficiency related diffuse (endemic) goiter: Secondary | ICD-10-CM

## 2020-08-08 MED ORDER — BENAZEPRIL HCL 10 MG PO TABS: ORAL_TABLET | ORAL | 3 refills | Status: DC

## 2020-08-08 NOTE — Assessment & Plan Note (Signed)
Refilled ACE-I She will f/u with PCP.

## 2020-08-08 NOTE — Assessment & Plan Note (Signed)
Will get her back in with endo

## 2020-08-08 NOTE — Assessment & Plan Note (Signed)
Encouraged to quit. 

## 2020-08-08 NOTE — Progress Notes (Signed)
   Subjective:    Patient ID: Shannon [name], female  DOB: [birth year], 55 y.o.        MRN: [id number]   HPI 55yo F HIV+, previous Chairi 1 repair (2000). Prev taking atripla, changed to [facility] 2016.Takes with food. Has not had PAP or colon (scheduled, rescheduled)  She had thyroid scan on 4-5-18showing:Multinodular thyroid gland demonstrating small warm and cold nodules in both lobes. Last endo appt was 2020, she believes. No TSH this year. Has had change in her voice, has sleep apnea (which she thinks is scaring her daughter).   Working from [employer] 2/3 days.  Completed her bachelor's degree in October 2022.  Has not seen psychiatry for awhile, ? Has not had her colon since last visit- states she will set this appt.  BP up today- has been off anti-htn rx for 1 month.  Has gained 6#. Continues to smoke 1/2 ppd, taking her prozac.   Is on pitavistatin vs placebo study via [facility]. No myalgias.   Has new romantic interest. Has not disclosed yet.  Would like condoms  HIV 1 RNA Quant  Date Value  07/23/2020 <20 Copies/mL  09/21/2019 <20 DETECTED copies/mL (A)  11/07/2018 <20 NOT DETECTED copies/mL   CD4 T Cell Abs (/uL)  Date Value  07/23/2020 1,247  09/21/2019 1,048  11/07/2018 1,032     Health Maintenance  Topic Date Due  . TETANUS/TDAP  Never done  . COLONOSCOPY (Pts 45-76yrs Insurance coverage will need to be confirmed)  Never done  . MAMMOGRAM  01/15/2020  . PAP SMEAR-Modifier  11/30/2020  . COVID-19 Vaccine  Completed  . Hepatitis C Screening  Completed  . HIV Screening  Completed  . HPV VACCINES  Aged Out      Review of Systems  Constitutional: Negative for malaise/fatigue and weight loss.  Respiratory: Negative for cough.   Cardiovascular: Negative for chest pain.  Gastrointestinal: Negative for constipation and diarrhea.  Neurological: Positive for headaches (1/ month. ~ migraine).    Please see HPI. All other systems reviewed and  negative.     Objective:  Physical Exam Vitals reviewed.  Constitutional:      General: She is not in acute distress.    Appearance: Normal appearance. She is obese. She is not toxic-appearing.  HENT:     Mouth/Throat:     Mouth: Mucous membranes are moist.     Pharynx: No oropharyngeal exudate.  Eyes:     Extraocular Movements: Extraocular movements intact.     Pupils: Pupils are equal, round, and reactive to light.  Cardiovascular:     Rate and Rhythm: Normal rate and regular rhythm.  Pulmonary:     Effort: Pulmonary effort is normal.     Breath sounds: Normal breath sounds.  Abdominal:     General: Bowel sounds are normal. There is no distension.     Palpations: Abdomen is soft.     Tenderness: There is no abdominal tenderness.  Musculoskeletal:        General: Normal range of motion.     Cervical back: Normal range of motion and neck supple.     Right lower leg: No edema.     Left lower leg: No edema.  Neurological:     General: No focal deficit present.     Mental Status: She is alert and oriented to person, place, and time.            Assessment & Plan:

## 2020-08-08 NOTE — Assessment & Plan Note (Signed)
Suggested she get shoes with heavier soles, more support.  No defect felt on exam of foot.L.

## 2020-08-08 NOTE — Assessment & Plan Note (Signed)
Doing well Will give condoms Encourage to disclose to new partner She will call for colon.  rtc in 9 months

## 2020-08-08 NOTE — Assessment & Plan Note (Signed)
She is working on wt loss.

## 2020-09-05 ENCOUNTER — Other Ambulatory Visit: Payer: Self-pay | Admitting: Nurse Practitioner

## 2020-09-05 DIAGNOSIS — F4310 Post-traumatic stress disorder, unspecified: Secondary | ICD-10-CM

## 2020-09-09 ENCOUNTER — Ambulatory Visit: Payer: Self-pay | Admitting: Nurse Practitioner

## 2020-09-27 ENCOUNTER — Other Ambulatory Visit (HOSPITAL_COMMUNITY): Payer: Self-pay

## 2020-09-27 MED ORDER — STUDY - REPRIEVE A5332 - PITAVASTATIN 4MG OR PLACEBO TABLET (PI-VAN DAM)
ORAL_TABLET | ORAL | 0 refills | Status: DC
  Filled 2020-09-27: qty 90, 90d supply, fill #0

## 2020-10-01 ENCOUNTER — Encounter: Payer: Managed Care, Other (non HMO) | Admitting: *Deleted

## 2020-10-08 ENCOUNTER — Other Ambulatory Visit: Payer: Self-pay | Admitting: Infectious Diseases

## 2020-10-08 DIAGNOSIS — B20 Human immunodeficiency virus [HIV] disease: Secondary | ICD-10-CM

## 2020-10-11 ENCOUNTER — Other Ambulatory Visit: Payer: Self-pay

## 2020-10-11 ENCOUNTER — Encounter (INDEPENDENT_AMBULATORY_CARE_PROVIDER_SITE_OTHER): Payer: Self-pay | Admitting: *Deleted

## 2020-10-11 VITALS — BP 136/82 | HR 73 | Temp 98.5°F | Wt 221.0 lb

## 2020-10-11 DIAGNOSIS — Z006 Encounter for examination for normal comparison and control in clinical research program: Secondary | ICD-10-CM

## 2020-10-11 NOTE — Research (Signed)
Noel seen today for her month 85 Reprieve visit, A Randomized Trial to Prevent Vascular Events in HIV (study drug is Pitavastatin 4mg  or placebo)  No new complaints or medications. Denied any muscle aches or weakness. Verbalized excellent adherence with her ARV and study mediations. She will return in August for her next study visit.

## 2020-10-13 NOTE — Progress Notes (Deleted)
Patient ID: Shannon [name], female   DOB: 1966/03/10, 55 y.o.   MRN: [id number]             Reason for Appointment: Goiter, follow-up visit    History of Present Illness:   She was first seen in 08/2016 for her goiter  The patient's thyroid enlargement was first discovered in 2011, probably on a routine physical exam She initially was evaluated by a thyroid ultrasound and also at that time she had needle aspiration biopsy on the dominant nodules on each side, Previous biopsy of right-sided nodule showed predominantly microfollicular pattern.  Since she had a low normal TSH and a nuclear scan was done in 2018 which showed small areas of warm and cold activity without any dominant focus  RECENT history:  Her PCP has referred her for follow-up for repeat consultation She was last seen in 04/2018  She is now complaining of increased sensation of pressure and tightness in her lower neck especially with certain movements of her head and when trying to lie down to sleep Still has occasional choking while eating or drinking Also at times has difficulty swallowing  She did have transient subclinical hyperthyroidism in 10/2018 but subsequently thyroid labs have been normal  Lab Results  Component Value Date   FREET4 0.92 10/24/2019   FREET4 0.79 08/28/2016   FREET4 1.0 06/29/2016   TSH 0.61 10/24/2019   TSH 0.481 08/31/2019   TSH 0.128 (L) 11/03/2018    She has had an ultrasound exam in 07/2016 with the following results:  Single RIGHT solid nodule measuring 3.0 cm; Other 2 dimensions: 1.9 cm x 2.3 cm) and single LEFT (designated #3) thyroid nodule meet criteria for biopsy, as designated by the newly established ACR TI-RADS criteria, and referral for biopsy is recommended.  The nodules show increase in size compared to the ultrasound in 2014, at that time the left-sided nodule measured 3 cm x 2.5 cm and now this measures 3.4 cm x 3.3 cm.  Previous management of the right-sided  nodule was 2.1 x 1.9 cm  Thyroid biopsy in 08/2009  THYROID, FINE NEEDLE ASPIRATION, RIGHT: Follicular epithelium in a predominantly microfollicular pattern. Based on this material, a follicular neoplasm can not be entirely ruled out.  THYROID,FINE NEEDLE ASPIRATON,LEFT: Benign. Findings consistent with a non-neoplastic goiter.   Allergies as of 10/14/2020      Reactions   Shellfish Allergy Anaphylaxis   Shrimp      Medication List       Accurate as of Oct 13, 2020  4:45 PM. If you have any questions, ask your nurse or doctor.        benazepril 10 MG tablet Commonly known as: LOTENSIN TAKE 1 TABLET(10 MG) BY MOUTH DAILY   FLUoxetine 20 MG capsule Commonly known as: PROZAC TAKE 1 CAPSULE DAILY   Odefsey 200-25-25 MG Tabs tablet Generic drug: emtricitabine-rilpivir-tenofovir AF TAKE 1 TABLET BY MOUTH DAILY WITH BREAKFAST   Pfizer-BioNTech COVID-19 Vacc 30 MCG/0.3ML injection Generic drug: COVID-19 mRNA vaccine (Pfizer) USE AS DIRECTED   REPRIEVE A5332 pitavastatin or placebo 4 mg Tabs tablet Take one tablet by mouth once daily.  [id number] B [facility] H4742       Allergies:  Allergies  Allergen Reactions  . Shellfish Allergy Anaphylaxis    Shrimp     Past Medical History:  Diagnosis Date  . Arnold-Chiari malformation ([place])   . DVT (deep venous thrombosis) ([place])   . HIV disease ([place])   . Hypertension   .  Multiple thyroid nodules    Fully functioning thyroid with no treatment indicated    Past Surgical History:  Procedure Laterality Date  . ABDOMINAL HYSTERECTOMY    . BRAIN SURGERY    . CHOLECYSTECTOMY      Family History  Problem Relation Age of Onset  . Asthma Mother   . Hypertension Mother   . Diabetes Mother   . Diabetes Father   . Cancer Father        colon at 75 yo  . Breast cancer Neg Hx     Social History:  reports that she has been smoking cigarettes. She has a 12.50 pack-year smoking history. She has never used smokeless  tobacco. She reports current alcohol use of about 1.0 standard drink of alcohol per week. She reports that she does not use drugs.    Review of Systems   She has had hypertension treated by her PCP with variable control  BP Readings from Last 3 Encounters:  10/11/20 136/82  08/08/20 (!) 146/102  07/03/20 (!) 139/93   Recent weight is about the same  Wt Readings from Last 3 Encounters:  10/11/20 221 lb (100.2 kg)  08/08/20 219 lb (99.3 kg)  07/03/20 216 lb 6 oz (98.1 kg)    Examination:   There were no vitals taken for this visit.  Right lobe is palpable mostly in the lateral lobe, about 2 times normal and relatively firm Left lobe is enlarged about 3 times normal, smooth and relatively firm without distinct nodule outlined. Isthmus is not clearly palpable but left lobe extends towards the isthmus Neck circumference over the thyroid is 43 cm Trachea appears slightly deviated to the right  There is no stridor.  Pemberton sign is positive and she has a choking on raising her hands over her head There is no lymphadenopathy anteriorly and laterally.     No tremor  Assessment/Plan:  Multinodular goiter, long-standing, since about 2011  Previous consultation notes and outside records as well as her progress was reviewed in detail  Largest nodule 3.4 cm on the left previously She has had biopsy on the nodules in 2011 and showed  microfollicular pattern on the right side She is now complaining of increased pressure and tightness in her neck and some discomfort with certain positions or movement of her neck.  Also has a little difficulty swallowing  On exam her nodules and thyroid size appears to be same; neck circumference is almost the same Some of her thyroid enlargement may be substernal  Thyroid levels are normal although previously had transiently decreased TSH Because of this she is not a candidate for I-131 treatment  Discussed the need for consideration of surgical  removal of the thyroid to relieve her symptoms Today showed her the her anatomical model of the thyroid and potential for compression of trachea and esophagus  Patient is willing to do this but wants to think about it more and also discussed with surgeon Explained to her that she will likely need a total thyroidectomy Also explained the small risks of hypoparathyroidism and rare laryngeal nerve injury.  She wants to wait at least a month to schedule the surgery and she will let us know when she is ready, will likely refer her at that time  Hypertension: Blood pressure is better today although has been somewhat labile in the past, to continue with PCP  Elayne Snare 10/13/2020

## 2020-10-14 ENCOUNTER — Ambulatory Visit: Payer: Managed Care, Other (non HMO) | Admitting: Endocrinology

## 2020-10-21 ENCOUNTER — Other Ambulatory Visit (HOSPITAL_COMMUNITY): Payer: Self-pay

## 2021-01-29 ENCOUNTER — Other Ambulatory Visit: Payer: Self-pay

## 2021-01-29 ENCOUNTER — Encounter (INDEPENDENT_AMBULATORY_CARE_PROVIDER_SITE_OTHER): Payer: Self-pay | Admitting: *Deleted

## 2021-01-29 VITALS — BP 145/87 | HR 74 | Temp 98.8°F | Wt 217.0 lb

## 2021-01-29 DIAGNOSIS — Z006 Encounter for examination for normal comparison and control in clinical research program: Secondary | ICD-10-CM

## 2021-01-29 NOTE — Research (Signed)
Shannon [name] here today for her month 74 visit for Reprieve, A Randomized Trial to Prevent Vascular Events in HIV. States that she did have cold symptoms back the beginning of July that lasted 2-3 days. Covid test negative x 2 at that time. Verbalized excellent adherence with her ARV and study medications. She will return in December to see Dr. Johnnye Sima and for her next research visit.

## 2021-05-08 ENCOUNTER — Other Ambulatory Visit: Payer: Self-pay | Admitting: Infectious Diseases

## 2021-05-08 ENCOUNTER — Other Ambulatory Visit: Payer: Managed Care, Other (non HMO)

## 2021-05-08 DIAGNOSIS — B20 Human immunodeficiency virus [HIV] disease: Secondary | ICD-10-CM

## 2021-05-09 ENCOUNTER — Other Ambulatory Visit (HOSPITAL_COMMUNITY)
Admission: RE | Admit: 2021-05-09 | Discharge: 2021-05-09 | Disposition: A | Discharge status: Home / Self Care | Payer: Managed Care, Other (non HMO) | Source: Ambulatory Visit | Attending: Infectious Diseases | Admitting: Infectious Diseases

## 2021-05-09 ENCOUNTER — Other Ambulatory Visit: Payer: Managed Care, Other (non HMO)

## 2021-05-09 ENCOUNTER — Other Ambulatory Visit: Payer: Self-pay

## 2021-05-09 DIAGNOSIS — R739 Hyperglycemia, unspecified: Secondary | ICD-10-CM

## 2021-05-09 DIAGNOSIS — E010 Iodine-deficiency related diffuse (endemic) goiter: Secondary | ICD-10-CM

## 2021-05-09 DIAGNOSIS — Z79899 Other long term (current) drug therapy: Secondary | ICD-10-CM

## 2021-05-09 DIAGNOSIS — Z113 Encounter for screening for infections with a predominantly sexual mode of transmission: Secondary | ICD-10-CM | POA: Insufficient documentation

## 2021-05-09 DIAGNOSIS — B20 Human immunodeficiency virus [HIV] disease: Secondary | ICD-10-CM

## 2021-05-12 LAB — T-HELPER CELLS (CD4) COUNT (NOT AT ARMC)
Absolute CD4: 1410 cells/uL (ref 490–1740)
CD4 T Helper %: 35 % (ref 30–61)
Total lymphocyte count: 4021 cells/uL — ABNORMAL HIGH (ref 850–3900)

## 2021-05-12 LAB — COMPREHENSIVE METABOLIC PANEL
AG Ratio: 1.5 (calc) (ref 1.0–2.5)
ALT: 17 U/L (ref 6–29)
AST: 17 U/L (ref 10–35)
Albumin: 4.3 g/dL (ref 3.6–5.1)
Alkaline phosphatase (APISO): 85 U/L (ref 37–153)
BUN: 9 mg/dL (ref 7–25)
CO2: 28 mmol/L (ref 20–32)
Calcium: 9.5 mg/dL (ref 8.6–10.4)
Chloride: 104 mmol/L (ref 98–110)
Creat: 0.94 mg/dL (ref 0.50–1.03)
Globulin: 2.8 g/dL (calc) (ref 1.9–3.7)
Glucose, Bld: 114 mg/dL — ABNORMAL HIGH (ref 65–99)
Potassium: 4.2 mmol/L (ref 3.5–5.3)
Sodium: 140 mmol/L (ref 135–146)
Total Bilirubin: 0.5 mg/dL (ref 0.2–1.2)
Total Protein: 7.1 g/dL (ref 6.1–8.1)

## 2021-05-12 LAB — CBC
HCT: 46.5 % — ABNORMAL HIGH (ref 35.0–45.0)
Hemoglobin: 15.7 g/dL — ABNORMAL HIGH (ref 11.7–15.5)
MCH: 28.9 pg (ref 27.0–33.0)
MCHC: 33.8 g/dL (ref 32.0–36.0)
MCV: 85.5 fL (ref 80.0–100.0)
MPV: 9.9 fL (ref 7.5–12.5)
Platelets: 243 Thousand/uL (ref 140–400)
RBC: 5.44 Million/uL — ABNORMAL HIGH (ref 3.80–5.10)
RDW: 14.3 % (ref 11.0–15.0)
WBC: 6.4 Thousand/uL (ref 3.8–10.8)

## 2021-05-12 LAB — HEMOGLOBIN A1C
Hgb A1c MFr Bld: 7 %{Hb} — ABNORMAL HIGH
Mean Plasma Glucose: 154 mg/dL
eAG (mmol/L): 8.5 mmol/L

## 2021-05-12 LAB — URINE CYTOLOGY ANCILLARY ONLY
Chlamydia: NEGATIVE
Comment: NEGATIVE
Comment: NORMAL
Neisseria Gonorrhea: NEGATIVE

## 2021-05-12 LAB — LIPID PANEL
Cholesterol: 229 mg/dL — ABNORMAL HIGH (ref <200)
HDL: 43 mg/dL — ABNORMAL LOW (ref >=50)
LDL Cholesterol (Calc): 161 mg/dL (calc) — ABNORMAL HIGH
Non-HDL Cholesterol (Calc): 186 mg/dL (calc) — ABNORMAL HIGH (ref <130)
Total CHOL/HDL Ratio: 5.3 (calc) — ABNORMAL HIGH (ref <5.0)
Triglycerides: 126 mg/dL (ref <150)

## 2021-05-12 LAB — HIV-1 RNA QUANT-NO REFLEX-BLD
HIV 1 RNA Quant: <20 {copies}/mL — ABNORMAL HIGH
HIV-1 RNA Quant, Log: <1.3 {Log_copies}/mL — ABNORMAL HIGH

## 2021-05-12 LAB — TSH: TSH: 0.28 m[IU]/L — ABNORMAL LOW (ref 0.40–4.50)

## 2021-05-12 LAB — SYPHILIS: RPR W/REFLEX TO RPR TITER AND TREPONEMAL ANTIBODIES, TRADITIONAL SCREENING AND DIAGNOSIS ALGORITHM: RPR Ser Ql: NONREACTIVE

## 2021-05-22 ENCOUNTER — Encounter: Payer: Managed Care, Other (non HMO) | Admitting: *Deleted

## 2021-05-22 ENCOUNTER — Encounter: Payer: Managed Care, Other (non HMO) | Admitting: Infectious Diseases

## 2021-06-13 ENCOUNTER — Other Ambulatory Visit: Payer: Self-pay

## 2021-06-13 ENCOUNTER — Ambulatory Visit (INDEPENDENT_AMBULATORY_CARE_PROVIDER_SITE_OTHER): Payer: Managed Care, Other (non HMO) | Admitting: Infectious Diseases

## 2021-06-13 ENCOUNTER — Encounter (INDEPENDENT_AMBULATORY_CARE_PROVIDER_SITE_OTHER): Payer: Self-pay | Admitting: *Deleted

## 2021-06-13 VITALS — BP 141/92 | HR 68 | Wt 209.0 lb

## 2021-06-13 DIAGNOSIS — E041 Nontoxic single thyroid nodule: Secondary | ICD-10-CM | POA: Diagnosis not present

## 2021-06-13 DIAGNOSIS — I10 Essential (primary) hypertension: Secondary | ICD-10-CM | POA: Diagnosis not present

## 2021-06-13 DIAGNOSIS — F17200 Nicotine dependence, unspecified, uncomplicated: Secondary | ICD-10-CM | POA: Diagnosis not present

## 2021-06-13 DIAGNOSIS — R739 Hyperglycemia, unspecified: Secondary | ICD-10-CM

## 2021-06-13 DIAGNOSIS — F3289 Other specified depressive episodes: Secondary | ICD-10-CM

## 2021-06-13 DIAGNOSIS — Z006 Encounter for examination for normal comparison and control in clinical research program: Secondary | ICD-10-CM

## 2021-06-13 DIAGNOSIS — M79671 Pain in right foot: Secondary | ICD-10-CM

## 2021-06-13 DIAGNOSIS — E669 Obesity, unspecified: Secondary | ICD-10-CM

## 2021-06-13 DIAGNOSIS — B20 Human immunodeficiency virus [HIV] disease: Secondary | ICD-10-CM

## 2021-06-13 NOTE — Assessment & Plan Note (Addendum)
I encouraged her to find new therapist.  She continues on SSRI

## 2021-06-13 NOTE — Assessment & Plan Note (Signed)
Still non-DM Will continue to monitor

## 2021-06-13 NOTE — Assessment & Plan Note (Signed)
Has lost wt, improved diet.  Encouraged her!

## 2021-06-13 NOTE — Research (Signed)
Shannon [name] here for her month 107 Reprieve visit, A Randomized Trial to Prevent Vascular Events in HIV (study drug is Pitavastatin 4mg  or placebo). Verbalized excellent adherence with her study medication. Denied any muscle aches or weakness. She will return in April for her next study visit.

## 2021-06-13 NOTE — Assessment & Plan Note (Signed)
Encouraged her to f/u with her Endo.

## 2021-06-13 NOTE — Assessment & Plan Note (Signed)
She has [facility] f/u today for statin study U=u Continue her current rx, discussed cabaneuva (she is not interested) Will see her back in 1 month.

## 2021-06-13 NOTE — Progress Notes (Signed)
Subjective:    Patient ID: Shannon [name], female  DOB: 1965-07-14, 56 y.o.        MRN: [id number]   HPI 56 yo F HIV+, previous Chairi 1 repair (2000). Prev taking atripla, changed to [facility] 2016. Takes with food.  Has (still) not had PAP or colon (scheduled, rescheduled)   She had thyroid scan on 09-10-16 showing: Multinodular thyroid gland demonstrating small warm and cold nodules in both lobes. Last endo appt was 2020, she believes.  Has not had any f/u 2022.  No TSH this year. Has had change in her voice, has sleep apnea (which she thinks is scaring her daughter).    Working from [employer] 2/3 days.  Completed her bachelor's degree in October 2022.  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  Continues to take prozac. Has not found a new therapist. Biting inside of mouth as a nervous habit.  BP up today- off her bp rx. "What would it take for you to get back on it?" "Tell me to get back on it."  8# wt loss, has started paying attn to her diet (less sugar). Walking dog daily.  Has gained 6#. Continues to smoke 1/2 ppd, taking her prozac.    Is on pitavistatin vs placebo study via [facility]. No myalgias.  Appt with them today.   In relationship. Considering (marriage)? Has been tested.  Brother moved in, not working or helping out. Daughter moved out but needs assistance. Father with ? Alzheimers, mother also with health issues.  Takes care of herself by "dancing a lot, cooking, playing phone games".  Denies missed ART.    HIV 1 RNA Quant  Date Value  05/09/2021 <20 Copies/mL (H)  07/23/2020 <20 Copies/mL  09/21/2019 <20 DETECTED copies/mL (A)   CD4 T Cell Abs (/uL)  Date Value  07/23/2020 1,247  09/21/2019 1,048  11/07/2018 1,032     Health Maintenance  Topic Date Due   TETANUS/TDAP  Never done   Zoster Vaccines- Shingrix (1 of 2) Never done   COLONOSCOPY (Pts 45-78yrs Insurance coverage will need to be confirmed)  Never done   MAMMOGRAM   01/15/2020   COVID-19 Vaccine (3 - Booster for Janssen series) 08/28/2020   PAP SMEAR-Modifier  11/30/2020   Pneumococcal Vaccine 75-43 Years old (4 - PPSV23 if available, else PCV20) 05/26/2029   Hepatitis C Screening  Completed   HIV Screening  Completed   HPV VACCINES  Aged Out      Review of Systems  Constitutional:  Negative for chills, fever and weight loss.  Respiratory:  Positive for cough. Negative for shortness of breath.   Cardiovascular:  Negative for chest pain.  Gastrointestinal:  Negative for constipation and diarrhea.  Genitourinary:  Negative for dysuria.  Musculoskeletal:  Positive for joint pain (R great toe pain).  Psychiatric/Behavioral:  Positive for depression.    Please see HPI. All other systems reviewed and negative.     Objective:  Physical Exam Vitals reviewed.  Constitutional:      Appearance: Normal appearance. She is obese.  HENT:     Mouth/Throat:     Mouth: Mucous membranes are moist.     Pharynx: No oropharyngeal exudate.  Eyes:     Extraocular Movements: Extraocular movements intact.     Pupils: Pupils are equal, round, and reactive to light.  Cardiovascular:     Rate and Rhythm: Normal rate and regular rhythm.  Pulmonary:     Effort: Pulmonary effort is  normal.     Breath sounds: Normal breath sounds.  Abdominal:     General: Bowel sounds are normal. There is no distension.     Palpations: Abdomen is soft.     Tenderness: There is no abdominal tenderness.  Musculoskeletal:     Cervical back: Normal range of motion and neck supple. No rigidity or tenderness.     Right lower leg: No edema.     Left lower leg: No edema.  Lymphadenopathy:     Cervical: No cervical adenopathy.  Neurological:     Mental Status: She is alert.  Psychiatric:        Mood and Affect: Mood normal.           Assessment & Plan:

## 2021-06-13 NOTE — Assessment & Plan Note (Signed)
She has cut back as her boyfriend does not allow in his house.  Encouraged her

## 2021-06-13 NOTE — Assessment & Plan Note (Signed)
>>  ASSESSMENT AND PLAN FOR THYROID  NODULE WRITTEN ON 06/13/2021  9:28 AM BY HATCHER, JEFFREY C, MD  Encouraged her to f/u with her Endo.

## 2021-06-13 NOTE — Assessment & Plan Note (Signed)
Suggested seeing podiatry for R foot pain- great toe.  No heat or swelling, or erythema.

## 2021-06-13 NOTE — Assessment & Plan Note (Signed)
She is asx I encouraged her to restart her anti-htn rx (lotensin) Will see her back in 1 month to recheck and hold her accountable.

## 2021-07-16 ENCOUNTER — Other Ambulatory Visit: Payer: Self-pay

## 2021-07-16 ENCOUNTER — Encounter: Payer: Self-pay | Admitting: Nurse Practitioner

## 2021-07-16 ENCOUNTER — Ambulatory Visit: Payer: Managed Care, Other (non HMO) | Admitting: Nurse Practitioner

## 2021-07-16 VITALS — BP 152/90 | HR 69 | Temp 97.9°F | Ht 62.0 in | Wt 206.1 lb

## 2021-07-16 DIAGNOSIS — Z1211 Encounter for screening for malignant neoplasm of colon: Secondary | ICD-10-CM

## 2021-07-16 DIAGNOSIS — Z1389 Encounter for screening for other disorder: Secondary | ICD-10-CM

## 2021-07-16 DIAGNOSIS — F1099 Alcohol use, unspecified with unspecified alcohol-induced disorder: Secondary | ICD-10-CM | POA: Insufficient documentation

## 2021-07-16 DIAGNOSIS — Z1239 Encounter for other screening for malignant neoplasm of breast: Secondary | ICD-10-CM

## 2021-07-16 DIAGNOSIS — E041 Nontoxic single thyroid nodule: Secondary | ICD-10-CM | POA: Diagnosis not present

## 2021-07-16 DIAGNOSIS — F332 Major depressive disorder, recurrent severe without psychotic features: Secondary | ICD-10-CM | POA: Diagnosis not present

## 2021-07-16 DIAGNOSIS — Z13220 Encounter for screening for lipoid disorders: Secondary | ICD-10-CM

## 2021-07-16 DIAGNOSIS — I10 Essential (primary) hypertension: Secondary | ICD-10-CM | POA: Diagnosis not present

## 2021-07-16 NOTE — Progress Notes (Signed)
[place], [place]  [address] Phone:  [phone]   Fax:  [phone]   Established Patient Office Visit  Subjective:  Patient ID: Shannon [name], female    DOB: [birth year]  Age: 56 y.o. MRN: [id number]  CC:  Chief Complaint  Patient presents with   Follow-up    Pt stated she wanted to have her left toe checked it has a blister also wanted to talk about her hair balding     HPI [place] presents for follow-up.  She has been lost to follow-up.  She  has a past medical history of Arnold-Chiari malformation ([place]), DVT (deep venous thrombosis) ([place]), HIV disease ([place]), Hypertension, and Multiple thyroid nodules.   She reports that she has been following up regularly with infectious disease; Dr. Johnnye Sima.  She was advised to return for routine care.  She has a history of hypertension is currently on benazepril 10 mg daily.  Her current BP is 152/90.  At home blood pressure monitoring is not routine. Denies headache, dizziness, visual changes, shortness of breath, dyspnea on exertion, chest pain, nausea, vomiting or any edema.   She reports being concerned about hair loss.  This has been ongoing.  She has a history of thyroid nodules.  She endorses being seen by endocrinology and was advised to follow-up again when she was ready for surgical intervention.  Past Medical History:  Diagnosis Date   Arnold-Chiari malformation ([facility])    DVT (deep venous thrombosis) ([facility])    HIV disease ([facility])    Hypertension    Multiple thyroid nodules    Fully functioning thyroid with no treatment indicated    Past Surgical History:  Procedure Laterality Date   ABDOMINAL HYSTERECTOMY     BRAIN SURGERY     CHOLECYSTECTOMY      Family History  Problem Relation Age of Onset   Asthma Mother    Hypertension Mother    Diabetes Mother    Diabetes Father    Cancer Father        colon at 10 yo   Breast cancer Neg Hx     Social History    Socioeconomic History   Marital status: Single    Spouse name: Not on file   Number of children: Not on file   Years of education: Not on file   Highest education level: Not on file  Occupational History   Not on file  Tobacco Use   Smoking status: Every Day    Packs/day: 0.50    Years: 25.00    Pack years: 12.50    Types: Cigarettes    Last attempt to quit: 03/24/2017    Years since quitting: 4.3   Smokeless tobacco: Never   Tobacco comments:    Patient trying to quit  Vaping Use   Vaping Use: Some days  Substance and Sexual Activity   Alcohol use: Yes    Alcohol/week: 1.0 standard drink    Types: 1 Standard drinks or equivalent per week    Comment: rare   Drug use: No   Sexual activity: Not Currently    Birth control/protection: Surgical    Comment: declined condoms  Other Topics Concern   Not on file  Social History Narrative   Not on file   Social Determinants of Health   Financial Resource Strain: Not on file  Food Insecurity: Not on file  Transportation Needs: Not on file  Physical Activity: Not on file  Stress: Not on file  Social Connections: Not on file  Intimate Partner Violence: Not on file    Outpatient Medications Prior to Visit  Medication Sig Dispense Refill   benazepril (LOTENSIN) 10 MG tablet TAKE 1 TABLET(10 MG) BY MOUTH DAILY 90 tablet 3   FLUoxetine (PROZAC) 20 MG capsule TAKE 1 CAPSULE DAILY 90 capsule 3   ODEFSEY 200-25-25 MG TABS tablet TAKE 1 TABLET BY MOUTH DAILY WITH BREAKFAST 30 tablet 5   Study - REPRIEVE [phone] - pitavastatin 4 mg or placebo tablet ([place]) Take one tablet by mouth once daily.  [id number] L [id number] B ACTG A5332 90 tablet 0   No facility-administered medications prior to visit.    Allergies  Allergen Reactions   Shellfish Allergy Anaphylaxis    Shrimp     ROS Review of Systems  Constitutional: Negative.        Drinking more water now She loves her coffee.  Exercising with walking 3 days per week 1.5  miles.  Musculoskeletal:        Cramps to thigh (left ) inner > right     Objective:    Physical Exam Constitutional:      Appearance: She is obese.  HENT:     Head: Normocephalic and atraumatic.     Nose: Nose normal.     Mouth/Throat:     Mouth: Mucous membranes are moist.  Neck:     Comments: Enlarged thyroid Cardiovascular:     Rate and Rhythm: Normal rate and regular rhythm.     Pulses: Normal pulses.     Heart sounds: Normal heart sounds.  Pulmonary:     Effort: Pulmonary effort is normal.     Breath sounds: Normal breath sounds.  Abdominal:     General: Bowel sounds are normal.     Palpations: Abdomen is soft.  Musculoskeletal:        General: Normal range of motion.     Cervical back: Normal range of motion. Tenderness present.     Right lower leg: No edema.     Left lower leg: No edema.  Skin:    General: Skin is warm and dry.     Capillary Refill: Capillary refill takes less than 2 seconds.  Neurological:     General: No focal deficit present.     Mental Status: She is alert and oriented to person, place, and time.  Psychiatric:        Mood and Affect: Mood normal.        Behavior: Behavior normal.    BP (!) 152/90    Pulse 69    Temp 97.9 F (36.6 C)    Ht 5\' 2"  (1.575 m)    Wt 206 lb 2 oz (93.5 kg)    SpO2 100%    BMI 37.70 kg/m  Wt Readings from Last 3 Encounters:  07/16/21 206 lb 2 oz (93.5 kg)  06/13/21 209 lb (94.8 kg)  06/13/21 209 lb (94.8 kg)     Health Maintenance Due  Topic Date Due   COLONOSCOPY (Pts 45-65yrs Insurance coverage will need to be confirmed)  Never done   MAMMOGRAM  01/15/2020    There are no preventive care reminders to display for this patient.  Lab Results  Component Value Date   TSH 0.28 (L) 05/09/2021   Lab Results  Component Value Date   WBC 6.4 05/09/2021   HGB 15.7 (H) 05/09/2021   HCT 46.5 (H) 05/09/2021   MCV 85.5 05/09/2021   PLT  243 05/09/2021   Lab Results  Component Value Date   NA 140  05/09/2021   K 4.2 05/09/2021   CO2 28 05/09/2021   GLUCOSE 114 (H) 05/09/2021   BUN 9 05/09/2021   CREATININE 0.94 05/09/2021   BILITOT 0.5 05/09/2021   ALKPHOS 98 08/31/2019   AST 17 05/09/2021   ALT 17 05/09/2021   PROT 7.1 05/09/2021   ALBUMIN 4.5 08/31/2019   CALCIUM 9.5 05/09/2021   ANIONGAP 5 10/04/2018   Lab Results  Component Value Date   CHOL 229 (H) 05/09/2021   Lab Results  Component Value Date   HDL 43 (L) 05/09/2021   Lab Results  Component Value Date   LDLCALC 161 (H) 05/09/2021   Lab Results  Component Value Date   TRIG 126 05/09/2021   Lab Results  Component Value Date   CHOLHDL 5.3 (H) 05/09/2021     Assessment & Plan:   Problem List Items Addressed This Visit       Cardiovascular and Mediastinum   Essential hypertension - Primary Encouraged on going compliance with current medication regimen Encouraged home monitoring and recording BP <130/80 Eating a heart-healthy diet with less salt Encouraged regular physical activity     Relevant Orders   Comp. Metabolic Panel (12) (Completed)     Endocrine   Thyroid nodule Persistent Recommend following up with endocrinology due to ongoing symptoms of hair loss   Relevant Orders   TSH (Completed)   T3 (Completed)   T4, free (Completed)     Other   Major depressive disorder, recurrent severe without psychotic features ([place]) Stable Continue on fluoxetine 20 mg as prescribed   Other Visit Diagnoses     Colon cancer screening       Screening for cholesterol level       Relevant Orders   Lipid panel (Completed)   Ambulatory referral to Gastroenterology   Screening for gout       Relevant Orders   Arthritis Panel (Completed)   Breast screening       Relevant Orders   MM 3D SCREEN BREAST BILATERAL       No orders of the defined types were placed in this encounter.   Follow-up: No follow-ups on file.    Vevelyn Francois, NP

## 2021-07-17 LAB — T3: T3, Total: 162 ng/dL (ref 71–180)

## 2021-07-17 LAB — COMP. METABOLIC PANEL (12)
AST: 17 IU/L (ref 0–40)
Albumin/Globulin Ratio: 1.5 (ref 1.2–2.2)
Albumin: 4.3 g/dL (ref 3.8–4.9)
Alkaline Phosphatase: 101 IU/L (ref 44–121)
BUN/Creatinine Ratio: 8 — ABNORMAL LOW (ref 9–23)
BUN: 7 mg/dL (ref 6–24)
Bilirubin Total: 0.4 mg/dL (ref 0.0–1.2)
Calcium: 9.2 mg/dL (ref 8.7–10.2)
Chloride: 105 mmol/L (ref 96–106)
Creatinine, Ser: 0.85 mg/dL (ref 0.57–1.00)
Globulin, Total: 2.8 g/dL (ref 1.5–4.5)
Glucose: 93 mg/dL (ref 70–99)
Potassium: 4.4 mmol/L (ref 3.5–5.2)
Sodium: 143 mmol/L (ref 134–144)
Total Protein: 7.1 g/dL (ref 6.0–8.5)
eGFR: 80 mL/min/{1.73_m2} (ref >59)

## 2021-07-17 LAB — ARTHRITIS PANEL
Anti Nuclear Antibody (ANA): NEGATIVE
Rheumatoid fact SerPl-aCnc: 35.8 IU/mL — ABNORMAL HIGH (ref <14.0)
Sed Rate: 37 mm/hr (ref 0–40)
Uric Acid: 5.1 mg/dL (ref 3.0–7.2)

## 2021-07-17 LAB — LIPID PANEL
Chol/HDL Ratio: 5 ratio — ABNORMAL HIGH (ref 0.0–4.4)
Cholesterol, Total: 220 mg/dL — ABNORMAL HIGH (ref 100–199)
HDL: 44 mg/dL (ref >39)
LDL Chol Calc (NIH): 157 mg/dL — ABNORMAL HIGH (ref 0–99)
Triglycerides: 103 mg/dL (ref 0–149)
VLDL Cholesterol Cal: 19 mg/dL (ref 5–40)

## 2021-07-17 LAB — T4, FREE: Free T4: 1.18 ng/dL (ref 0.82–1.77)

## 2021-07-17 LAB — TSH: TSH: 0.197 u[IU]/mL — ABNORMAL LOW (ref 0.450–4.500)

## 2021-08-01 ENCOUNTER — Other Ambulatory Visit: Payer: Self-pay | Admitting: Infectious Diseases

## 2021-08-01 DIAGNOSIS — B20 Human immunodeficiency virus [HIV] disease: Secondary | ICD-10-CM

## 2021-08-21 ENCOUNTER — Other Ambulatory Visit: Payer: Self-pay

## 2021-08-21 ENCOUNTER — Ambulatory Visit: Payer: Managed Care, Other (non HMO) | Admitting: Infectious Diseases

## 2021-08-21 ENCOUNTER — Encounter: Payer: Self-pay | Admitting: Infectious Diseases

## 2021-08-21 VITALS — BP 147/85 | HR 90 | Resp 16 | Ht 62.0 in | Wt 206.0 lb

## 2021-08-21 DIAGNOSIS — Z79899 Other long term (current) drug therapy: Secondary | ICD-10-CM

## 2021-08-21 DIAGNOSIS — R739 Hyperglycemia, unspecified: Secondary | ICD-10-CM

## 2021-08-21 DIAGNOSIS — I10 Essential (primary) hypertension: Secondary | ICD-10-CM

## 2021-08-21 DIAGNOSIS — E041 Nontoxic single thyroid nodule: Secondary | ICD-10-CM

## 2021-08-21 DIAGNOSIS — F3289 Other specified depressive episodes: Secondary | ICD-10-CM

## 2021-08-21 DIAGNOSIS — B20 Human immunodeficiency virus [HIV] disease: Secondary | ICD-10-CM | POA: Diagnosis not present

## 2021-08-21 DIAGNOSIS — Z113 Encounter for screening for infections with a predominantly sexual mode of transmission: Secondary | ICD-10-CM

## 2021-08-21 DIAGNOSIS — F17200 Nicotine dependence, unspecified, uncomplicated: Secondary | ICD-10-CM

## 2021-08-21 DIAGNOSIS — E669 Obesity, unspecified: Secondary | ICD-10-CM

## 2021-08-21 DIAGNOSIS — Z23 Encounter for immunization: Secondary | ICD-10-CM | POA: Diagnosis not present

## 2021-08-21 MED ORDER — ODEFSEY 200-25-25 MG PO TABS: 1.0000 | ORAL_TABLET | Freq: Every day | ORAL | 3 refills | Status: DC

## 2021-08-21 NOTE — Progress Notes (Signed)
? ?Subjective:  ? ? Patient ID: Shannon [name], female  DOB: 08/08/1965, 56 y.o.        MRN: [id number] ? ? ?HPI ?56 yo F HIV+, previous Chairi 1 repair (2000). Prev taking atripla, changed to [facility] 2016. Takes with food.  ?Has (still) not had PAP or colon (scheduled, rescheduled) ?  ?She had thyroid scan on 09-10-16 showing: Multinodular thyroid gland demonstrating small warm and cold nodules in both lobes. ?Last endo appt was 2020, she believes.  ?Has not had any f/u 2022. She is going to get appt on her way out of building.  ? ?Has set up her mammo, colon.  ?Taking her ART, MVI.  ?Denies missed ART. ?Is on pitavistatin vs placebo study via ACTG. No myalgias.  ?Appt with them today.  ?  ?quit taking he prozac. 3 weeks ago. Has been having nightmares, more depressed,  ?Getting ready to disown her daughter (financial issues- rent, Sales executive). Boundaries issues.  ?Father (dementia) is worsening.   ?Has lost 13# from 1 year ago, still working on. Boyfriend trying to increase her wt.  ?Rationing her cigarettes.  ? ?   ?HIV 1 RNA Quant  ?Date Value  ?05/09/2021 <20 Copies/mL (H)  ?07/23/2020 <20 Copies/mL  ?09/21/2019 <20 DETECTED copies/mL (A)  ? ?CD4 T Cell Abs (/uL)  ?Date Value  ?07/23/2020 1,247  ?09/21/2019 1,048  ?11/07/2018 1,032  ? ?Lab Results  ?Component Value Date  ? CHOL 220 (H) 07/16/2021  ? HDL 44 07/16/2021  ? LDLCALC 157 (H) 07/16/2021  ? TRIG 103 07/16/2021  ? CHOLHDL 5.0 (H) 07/16/2021  ?  ? ? ?Health Maintenance  ?Topic Date Due  ? COLONOSCOPY (Pts 45-20yr Insurance coverage will need to be confirmed)  Never done  ? MAMMOGRAM  01/15/2020  ? COVID-19 Vaccine (3 - Booster for Janssen series) 08/28/2020  ? Zoster Vaccines- Shingrix (1 of 2) 10/13/2021 (Originally 05/27/1983)  ? TETANUS/TDAP  07/16/2022 (Originally 05/27/1983)  ? Hepatitis C Screening  Completed  ? HIV Screening  Completed  ? HPV VACCINES  Aged Out  ? PAP SMEAR-Modifier  Discontinued  ? ? ? ? ?Review of Systems  ?Constitutional:   Negative for chills, fever and weight loss.  ?Respiratory:  Negative for cough and shortness of breath.   ?Cardiovascular:  Positive for chest pain (infrequent, no pattern. "sticky pain". questions if gas, gets better if she passes gas. non-exertional (walks 1.5 mi daily)).  ?Gastrointestinal:  Negative for constipation and diarrhea.  ?Genitourinary:  Negative for dysuria.  ?Neurological:  Negative for headaches.  ?Psychiatric/Behavioral:  Positive for depression. The patient does not have insomnia.   ? ?Please see HPI. All other systems reviewed and negative. ? ?   ?Objective:  ?Physical Exam ?Vitals reviewed.  ?Constitutional:   ?   General: She is not in acute distress. ?   Appearance: She is not toxic-appearing.  ?HENT:  ?   Mouth/Throat:  ?   Mouth: Mucous membranes are moist.  ?   Pharynx: No oropharyngeal exudate.  ?Eyes:  ?   Extraocular Movements: Extraocular movements intact.  ?   Pupils: Pupils are equal, round, and reactive to light.  ?Cardiovascular:  ?   Rate and Rhythm: Normal rate and regular rhythm.  ?Pulmonary:  ?   Effort: Pulmonary effort is normal.  ?   Breath sounds: Normal breath sounds.  ?Abdominal:  ?   General: Bowel sounds are normal. There is no distension.  ?   Palpations: Abdomen is soft.  ?  Tenderness: There is no abdominal tenderness.  ?Musculoskeletal:  ?   Cervical back: Normal range of motion and neck supple.  ?Neurological:  ?   Mental Status: She is alert.  ? ? ? ? ? ? ?   ?Assessment & Plan:  ? ?

## 2021-08-21 NOTE — Assessment & Plan Note (Signed)
Has lost wt, will continue to watch her A1C and glc.  ?

## 2021-08-21 NOTE — Assessment & Plan Note (Signed)
Will watch her off SSRI. She may restart on her own.  ?

## 2021-08-21 NOTE — Assessment & Plan Note (Signed)
>>  ASSESSMENT AND PLAN FOR THYROID  NODULE WRITTEN ON 08/21/2021  2:59 PM BY HATCHER, JEFFREY C, MD  She will f/u with Endo (make appt today)

## 2021-08-21 NOTE — Assessment & Plan Note (Signed)
She is doing well ?Has her preventative care lined up.  ?She needs flu vax today ?She needs PCV 20. Will wait til 52.  ?Offered/refused condoms.  ?Will see her back in 9 months.  ?

## 2021-08-21 NOTE — Assessment & Plan Note (Signed)
Will check her A1C at f/u.  ?

## 2021-08-21 NOTE — Assessment & Plan Note (Signed)
Improved today, asx, taking her rx ?May need increased dose.  ?

## 2021-08-21 NOTE — Assessment & Plan Note (Signed)
Encouraged to quit. 

## 2021-08-21 NOTE — Assessment & Plan Note (Signed)
She will f/u with Endo (make appt today) ?

## 2021-08-27 ENCOUNTER — Ambulatory Visit: Payer: Managed Care, Other (non HMO)

## 2021-09-01 ENCOUNTER — Other Ambulatory Visit: Payer: Self-pay | Admitting: Infectious Diseases

## 2021-09-01 ENCOUNTER — Other Ambulatory Visit: Payer: Self-pay | Admitting: Nurse Practitioner

## 2021-09-01 DIAGNOSIS — I10 Essential (primary) hypertension: Secondary | ICD-10-CM

## 2021-09-01 DIAGNOSIS — F4310 Post-traumatic stress disorder, unspecified: Secondary | ICD-10-CM

## 2021-09-01 NOTE — Telephone Encounter (Signed)
Please advise on refill.

## 2021-09-24 ENCOUNTER — Encounter: Payer: Self-pay | Admitting: *Deleted

## 2021-10-13 ENCOUNTER — Ambulatory Visit: Payer: Self-pay | Admitting: Nurse Practitioner

## 2021-10-21 ENCOUNTER — Encounter: Payer: Self-pay | Admitting: *Deleted

## 2021-10-22 ENCOUNTER — Other Ambulatory Visit: Payer: Self-pay

## 2021-10-22 ENCOUNTER — Encounter (INDEPENDENT_AMBULATORY_CARE_PROVIDER_SITE_OTHER): Payer: Self-pay | Admitting: *Deleted

## 2021-10-22 VITALS — BP 128/85 | HR 69 | Temp 98.2°F | Wt 209.1 lb

## 2021-10-22 DIAGNOSIS — Z006 Encounter for examination for normal comparison and control in clinical research program: Secondary | ICD-10-CM

## 2021-10-22 NOTE — Research (Signed)
Jeffrey here today for her final study visit for [employer]. Verbalized excellent adherence with her ARV and study medications. States that she is interested in participating in additional studies in the future. She is scheduled to see Dr. Linus Salmons in September.  ?

## 2021-10-24 ENCOUNTER — Other Ambulatory Visit: Payer: Self-pay | Admitting: *Deleted

## 2021-10-24 ENCOUNTER — Other Ambulatory Visit: Payer: Self-pay | Admitting: Infectious Diseases

## 2021-10-24 DIAGNOSIS — B20 Human immunodeficiency virus [HIV] disease: Secondary | ICD-10-CM

## 2021-11-17 ENCOUNTER — Encounter: Payer: Self-pay | Admitting: Infectious Diseases

## 2021-12-01 ENCOUNTER — Other Ambulatory Visit: Payer: Self-pay | Admitting: Nurse Practitioner

## 2021-12-01 DIAGNOSIS — F4310 Post-traumatic stress disorder, unspecified: Secondary | ICD-10-CM

## 2022-02-04 ENCOUNTER — Other Ambulatory Visit: Payer: Self-pay

## 2022-02-04 ENCOUNTER — Other Ambulatory Visit: Payer: Managed Care, Other (non HMO)

## 2022-02-04 DIAGNOSIS — B20 Human immunodeficiency virus [HIV] disease: Secondary | ICD-10-CM

## 2022-02-04 DIAGNOSIS — Z113 Encounter for screening for infections with a predominantly sexual mode of transmission: Secondary | ICD-10-CM

## 2022-02-04 DIAGNOSIS — Z79899 Other long term (current) drug therapy: Secondary | ICD-10-CM

## 2022-02-18 ENCOUNTER — Ambulatory Visit: Payer: 59 | Admitting: Internal Medicine

## 2022-02-20 ENCOUNTER — Ambulatory Visit: Payer: Managed Care, Other (non HMO) | Admitting: Internal Medicine

## 2022-03-03 ENCOUNTER — Other Ambulatory Visit: Payer: Self-pay

## 2022-03-03 DIAGNOSIS — B20 Human immunodeficiency virus [HIV] disease: Secondary | ICD-10-CM

## 2022-03-03 MED ORDER — ODEFSEY 200-25-25 MG PO TABS: 1.0000 | ORAL_TABLET | Freq: Every day | ORAL | 0 refills | Status: DC

## 2022-03-12 ENCOUNTER — Other Ambulatory Visit: Payer: Self-pay

## 2022-03-12 ENCOUNTER — Ambulatory Visit: Payer: 59 | Admitting: Internal Medicine

## 2022-03-12 ENCOUNTER — Other Ambulatory Visit (HOSPITAL_COMMUNITY): Payer: Self-pay

## 2022-03-12 ENCOUNTER — Other Ambulatory Visit (HOSPITAL_COMMUNITY)
Admission: RE | Admit: 2022-03-12 | Discharge: 2022-03-12 | Disposition: A | Discharge status: Home / Self Care | Payer: 59 | Source: Ambulatory Visit | Attending: Internal Medicine | Admitting: Internal Medicine

## 2022-03-12 ENCOUNTER — Encounter: Payer: Self-pay | Admitting: Internal Medicine

## 2022-03-12 VITALS — BP 134/90 | HR 76 | Temp 98.5°F | Ht 62.0 in | Wt 204.0 lb

## 2022-03-12 DIAGNOSIS — B20 Human immunodeficiency virus [HIV] disease: Secondary | ICD-10-CM | POA: Diagnosis present

## 2022-03-12 DIAGNOSIS — F17200 Nicotine dependence, unspecified, uncomplicated: Secondary | ICD-10-CM | POA: Diagnosis not present

## 2022-03-12 DIAGNOSIS — E041 Nontoxic single thyroid nodule: Secondary | ICD-10-CM

## 2022-03-12 DIAGNOSIS — E669 Obesity, unspecified: Secondary | ICD-10-CM

## 2022-03-12 DIAGNOSIS — R739 Hyperglycemia, unspecified: Secondary | ICD-10-CM | POA: Diagnosis not present

## 2022-03-12 DIAGNOSIS — Z113 Encounter for screening for infections with a predominantly sexual mode of transmission: Secondary | ICD-10-CM | POA: Diagnosis present

## 2022-03-12 MED ORDER — BICTEGRAVIR-EMTRICITAB-TENOFOV 50-200-25 MG PO TABS: 1.0000 | ORAL_TABLET | Freq: Every day | ORAL | 11 refills | Status: DC

## 2022-03-12 NOTE — Assessment & Plan Note (Signed)
Will screen 

## 2022-03-12 NOTE — Progress Notes (Signed)
   Subjective:    Patient ID: Sidney Ace, female    DOB: Apr 19, 1966, 56 y.o.   MRN: [id number]  HPI Dajahnae is here for follow up of HIV Previous patient of Dr. Johnnye Sima.  Has been on Odefsey without issues.  Previously on Atripla.  No missed doses.   Has a PCP listed but has not been going.  Looking for a new PCP. No labs prior to the visit.   Has not scheduled colonoscopy.  No recent PAP smear.  Needs thyroid surgery.    Review of Systems  Constitutional:  Negative for fatigue.  Gastrointestinal:  Negative for diarrhea.  Skin:  Negative for rash.       Objective:   Physical Exam Eyes:     General: No scleral icterus. Pulmonary:     Effort: Pulmonary effort is normal.  Neurological:     General: No focal deficit present.     Mental Status: She is alert.  Psychiatric:        Mood and Affect: Mood normal.   SH+ tobacco        Assessment & Plan:

## 2022-03-12 NOTE — Assessment & Plan Note (Signed)
Discussed the need for weight loss

## 2022-03-12 NOTE — Assessment & Plan Note (Signed)
Will check her Hgb A1c.  Her sugar has been elevated. She will then discuss the results with her PCP

## 2022-03-12 NOTE — Assessment & Plan Note (Signed)
>>  ASSESSMENT AND PLAN FOR THYROID  NODULE WRITTEN ON 03/12/2022  3:10 PM BY COMER, LAMAR ORN, MD  She was advised to return to endocrinology

## 2022-03-12 NOTE — Assessment & Plan Note (Addendum)
She is doing well with Odefsey but does not take it with food. She is interested in switching and I did discuss different regimens and opted for Biktarvy.  She will switch with her next refill.  She will follow up 2-3 weeks after the switch.   I have personally spent 40 minutes involved in face-to-face and non-face-to-face activities for this patient on the day of the visit. Professional time spent includes the following activities: Preparing to see the patient (review of tests), Obtaining and/or reviewing separately obtained history (admission/discharge record), Performing a medically appropriate examination and/or evaluation , Ordering medications/tests/procedures, referring and communicating with other health care professionals, Documenting clinical information in the EMR, Independently interpreting results (not separately reported), Communicating results to the patient/family/caregiver, Counseling and educating the patient/family/caregiver and Care coordination (not separately reported).

## 2022-03-12 NOTE — Assessment & Plan Note (Signed)
She was advised to return to endocrinology

## 2022-03-13 ENCOUNTER — Telehealth: Payer: Self-pay | Admitting: Infectious Diseases

## 2022-03-13 LAB — URINE CYTOLOGY ANCILLARY ONLY
Chlamydia: NEGATIVE
Comment: NEGATIVE
Comment: NORMAL
Neisseria Gonorrhea: NEGATIVE

## 2022-03-13 LAB — T-HELPER CELL (CD4) - (RCID CLINIC ONLY)
CD4 % Helper T Cell: 39 % (ref 33–65)
CD4 T Cell Abs: 1175 /uL (ref 400–1790)

## 2022-03-13 NOTE — Telephone Encounter (Signed)
Called pt to discuss f/u

## 2022-03-15 LAB — LIPID PANEL
Cholesterol: 216 mg/dL — ABNORMAL HIGH (ref <200)
HDL: 44 mg/dL — ABNORMAL LOW (ref >=50)
LDL Cholesterol (Calc): 149 mg/dL (calc) — ABNORMAL HIGH
Non-HDL Cholesterol (Calc): 172 mg/dL (calc) — ABNORMAL HIGH (ref <130)
Total CHOL/HDL Ratio: 4.9 (calc) (ref <5.0)
Triglycerides: 111 mg/dL (ref <150)

## 2022-03-15 LAB — COMPLETE METABOLIC PANEL WITH GFR
AG Ratio: 1.3 (calc) (ref 1.0–2.5)
ALT: 14 U/L (ref 6–29)
AST: 15 U/L (ref 10–35)
Albumin: 4 g/dL (ref 3.6–5.1)
Alkaline phosphatase (APISO): 83 U/L (ref 37–153)
BUN: 10 mg/dL (ref 7–25)
CO2: 26 mmol/L (ref 20–32)
Calcium: 9.4 mg/dL (ref 8.6–10.4)
Chloride: 109 mmol/L (ref 98–110)
Creat: 0.9 mg/dL (ref 0.50–1.03)
Globulin: 3.2 g/dL (calc) (ref 1.9–3.7)
Glucose, Bld: 129 mg/dL — ABNORMAL HIGH (ref 65–99)
Potassium: 4.2 mmol/L (ref 3.5–5.3)
Sodium: 141 mmol/L (ref 135–146)
Total Bilirubin: 0.5 mg/dL (ref 0.2–1.2)
Total Protein: 7.2 g/dL (ref 6.1–8.1)
eGFR: 75 mL/min/{1.73_m2} (ref >=60)

## 2022-03-15 LAB — URINALYSIS, ROUTINE W REFLEX MICROSCOPIC
Bacteria, UA: NONE SEEN /HPF
Bilirubin Urine: NEGATIVE
Glucose, UA: NEGATIVE
Hgb urine dipstick: NEGATIVE
Hyaline Cast: NONE SEEN /LPF
Ketones, ur: NEGATIVE
Nitrite: NEGATIVE
Protein, ur: NEGATIVE
RBC / HPF: NONE SEEN /HPF (ref 0–2)
Specific Gravity, Urine: 1.01 (ref 1.001–1.035)
pH: 6 (ref 5.0–8.0)

## 2022-03-15 LAB — CBC WITH DIFFERENTIAL/PLATELET
Absolute Monocytes: 525 cells/uL (ref 200–950)
Basophils Absolute: 42 cells/uL (ref 0–200)
Basophils Relative: 0.6 %
Eosinophils Absolute: 70 cells/uL (ref 15–500)
Eosinophils Relative: 1 %
HCT: 44.4 % (ref 35.0–45.0)
Hemoglobin: 15.2 g/dL (ref 11.7–15.5)
Lymphs Abs: 3360 cells/uL (ref 850–3900)
MCH: 28.9 pg (ref 27.0–33.0)
MCHC: 34.2 g/dL (ref 32.0–36.0)
MCV: 84.4 fL (ref 80.0–100.0)
MPV: 9.6 fL (ref 7.5–12.5)
Monocytes Relative: 7.5 %
Neutro Abs: 3003 cells/uL (ref 1500–7800)
Neutrophils Relative %: 42.9 %
Platelets: 253 10*3/uL (ref 140–400)
RBC: 5.26 10*6/uL — ABNORMAL HIGH (ref 3.80–5.10)
RDW: 14.3 % (ref 11.0–15.0)
Total Lymphocyte: 48 %
WBC: 7 10*3/uL (ref 3.8–10.8)

## 2022-03-15 LAB — HEMOGLOBIN A1C
Hgb A1c MFr Bld: 6.8 % of total Hgb — ABNORMAL HIGH (ref <5.7)
Mean Plasma Glucose: 148 mg/dL
eAG (mmol/L): 8.2 mmol/L

## 2022-03-15 LAB — MICROSCOPIC MESSAGE

## 2022-03-15 LAB — HIV-1 RNA QUANT-NO REFLEX-BLD
HIV 1 RNA Quant: NOT DETECTED Copies/mL
HIV-1 RNA Quant, Log: NOT DETECTED Log cps/mL

## 2022-03-15 LAB — RPR: RPR Ser Ql: NONREACTIVE

## 2022-03-26 ENCOUNTER — Other Ambulatory Visit: Payer: Self-pay

## 2022-03-26 ENCOUNTER — Encounter: Payer: Self-pay | Admitting: Infectious Diseases

## 2022-03-26 ENCOUNTER — Ambulatory Visit (INDEPENDENT_AMBULATORY_CARE_PROVIDER_SITE_OTHER): Payer: 59 | Admitting: Infectious Diseases

## 2022-03-26 ENCOUNTER — Other Ambulatory Visit (HOSPITAL_COMMUNITY)
Admission: RE | Admit: 2022-03-26 | Discharge: 2022-03-26 | Disposition: A | Discharge status: Home / Self Care | Payer: 59 | Source: Ambulatory Visit | Attending: Infectious Diseases | Admitting: Infectious Diseases

## 2022-03-26 VITALS — BP 135/90 | HR 72 | Resp 16 | Ht 62.0 in | Wt 203.0 lb

## 2022-03-26 DIAGNOSIS — Z124 Encounter for screening for malignant neoplasm of cervix: Secondary | ICD-10-CM | POA: Insufficient documentation

## 2022-03-26 DIAGNOSIS — B20 Human immunodeficiency virus [HIV] disease: Secondary | ICD-10-CM

## 2022-03-26 DIAGNOSIS — Z1231 Encounter for screening mammogram for malignant neoplasm of breast: Secondary | ICD-10-CM | POA: Insufficient documentation

## 2022-03-26 NOTE — Assessment & Plan Note (Addendum)
Normal pelvic exam. Vaginal cuff inspection is normal.  Vaginal brushing collected for cytology and HPV.  Discussed recommended screening interval for women living with HIV disease meant to be lifelong and at an interval of Q1-3 years pending results. Acceptable to space out Q3y with 3 consecutively normal exams. Further recommendations for [place] to follow today's results.  Results will be communicated to the patient via telephone call.    Following hysterectomy for benign disease, routine screening for vaginal cancer is not recommended for women who are HIV (+); however women with a h/o high-grade CIN, adenocarcenoma in situ or other invasive cervical cancer are at increased risk and should be followed with annual vaginal cuff pap tests. If patients are uncertain as to previous pap smear results, recommendation is to continue screening vaginal cuff.   If normal study, we can recommend no further testing going forward.

## 2022-03-26 NOTE — Patient Instructions (Addendum)
If this pap smear is normal you can graduate from having to have these tests further!   After a hysterectomy (since your cervix is gone) the risk of cervical cancer in women is extremely low that we no longer recommend screening anylonger.   Will let you know what your results are and recommendations.   Plan to get your flu shot after you come back from your trip.   Please call the [facility] to set up your mammogram.  [phone] [address], [place], [place] [address]

## 2022-03-26 NOTE — Assessment & Plan Note (Signed)
2 weeks of samples given today to bridge while she is out of town and cannot fill her [employer].  Flu shot when she gets back.

## 2022-03-26 NOTE — Assessment & Plan Note (Signed)
Mammogram orders sent in for routine health screenings. Family history with maternal aunt for breast cancer.

## 2022-03-26 NOTE — Progress Notes (Signed)
Subjective :    Shannon [name] is a 56 y.o. female here for an annual pelvic exam and pap smear.   Review of Systems: Current GYN complaints or concerns: no concerns. Had a pap in 2019. Long time ago had some abnormal studies but has since had a hysterectomy for fibroids/bleeding. Aunt has h/o breast cancer. Has not had a mammo in several years (2019) but all normal.  She is going out of town and wants to see if she can get a sample or so to bridge her until she comes back to fill.    Patient denies any abdominal/pelvic pain, problems with bowel movements, urination, vaginal discharge or intercourse.   Past Medical History:  Diagnosis Date   Arnold-Chiari malformation (HCC)    DVT (deep venous thrombosis) (HCC)    HIV disease (HCC)    Hypertension    Multiple thyroid nodules    Fully functioning thyroid with no treatment indicated    Gynecologic History: G2P0  No LMP recorded. Patient has had a hysterectomy. Contraception: status post hysterectomy Last Pap: 2019. Results were: normal Last Mammogram: 2019. Results were: normal    Objective :   Physical Exam - chaperone present  Constitutional: Well developed, well nourished, no acute distress. She is alert and oriented x3.  Pelvic: External genitalia is normal in appearance. The vagina is normal in appearance. Cervix is surgically absent.  Breasts: symmetrical in contour, shape and texture. No palpable masses/nodules. No nipple discharge.  Psych: She has a normal mood and affect.      Assessment & Plan:    Patient Active Problem List   Diagnosis Date Noted   Screening for cervical cancer 03/26/2022   Encounter for screening mammogram for malignant neoplasm of breast 03/26/2022   Routine screening for STI (sexually transmitted infection) 03/12/2022   Alcohol use, unspecified with unspecified alcohol-induced disorder ([place]) 07/16/2021   Major depressive disorder, recurrent severe without psychotic features  ([place]) 07/16/2021   Obesity (BMI 30-39.9) 08/08/2020   Right foot pain 08/08/2020   Thyromegaly 11/11/2015   Nephrolithiasis 06/24/2015   Thyroid nodule 08/22/2012   Hyperglycemia 04/29/2011   Diarrhea 03/31/2011   POLYURIA 04/26/2008   KNEE PAIN, BILATERAL 11/12/2006   ARTHRITIS, LEFT KNEE 07/29/2006   POSTPROCEDURAL STATUS NEC 07/14/2006   Human immunodeficiency virus (HIV) disease ([place]) 04/14/2006   TOBACCO ABUSE 04/14/2006   Depression 04/14/2006   Essential hypertension 04/14/2006   GERD 04/14/2006    Problem List Items Addressed This Visit       Unprioritized   Human immunodeficiency virus (HIV) disease ([place])    2 weeks of samples given today to bridge while she is out of town and cannot fill her [place].  Flu shot when she gets back.       Screening for cervical cancer - Primary    Normal pelvic exam. Vaginal cuff inspection is normal.  Vaginal brushing collected for cytology and HPV.  Discussed recommended screening interval for women living with HIV disease meant to be lifelong and at an interval of Q1-3 years pending results. Acceptable to space out Q3y with 3 consecutively normal exams. Further recommendations for [place] to follow today's results.  Results will be communicated to the patient via telephone call.    Following hysterectomy for benign disease, routine screening for vaginal cancer is not recommended for women who are HIV (+); however women with a h/o high-grade CIN, adenocarcenoma in situ or other invasive cervical cancer are at increased  risk and should be followed with annual vaginal cuff pap tests. If patients are uncertain as to previous pap smear results, recommendation is to continue screening vaginal cuff.   If normal study, we can recommend no further testing going forward.        Relevant Orders   Cytology - PAP( [place])   Encounter for screening mammogram for malignant neoplasm of breast    Mammogram orders sent in for  routine health screenings. Family history with maternal aunt for breast cancer.       Relevant Orders   MM Digital Screening      Janene Madeira, MSN, NP-C [facility] [place] Office: [phone] Pager: [phone]  03/26/22 3:17 PM

## 2022-03-30 LAB — CYTOLOGY - PAP
Chlamydia: NEGATIVE
Comment: NEGATIVE
Comment: NEGATIVE
Comment: NEGATIVE
Comment: NORMAL
Diagnosis: NEGATIVE
High risk HPV: NEGATIVE
Neisseria Gonorrhea: NEGATIVE
Trichomonas: NEGATIVE

## 2022-03-31 ENCOUNTER — Other Ambulatory Visit: Payer: Self-pay | Admitting: Pharmacist

## 2022-03-31 DIAGNOSIS — B20 Human immunodeficiency virus [HIV] disease: Secondary | ICD-10-CM

## 2022-03-31 DIAGNOSIS — Z124 Encounter for screening for malignant neoplasm of cervix: Secondary | ICD-10-CM

## 2022-03-31 MED ORDER — BICTEGRAVIR-EMTRICITAB-TENOFOV 50-200-25 MG PO TABS: 1.0000 | ORAL_TABLET | Freq: Every day | ORAL | 0 refills | Status: AC

## 2022-03-31 NOTE — Progress Notes (Signed)
Medication Samples have been provided to the patient.  Drug name: Biktarvy        Strength: 50/200/25 mg       Qty: 14 tablets (2 bottles) LOT: [id number]   Exp.Date: 9/25  Dosing instructions: Take one tablet by mouth once daily  The patient has been instructed regarding the correct time, dose, and frequency of taking this medication, including desired effects and most common side effects.   Oliva Montecalvo, PharmD, CPP, BCIDP, AAHIVP Clinical Pharmacist Practitioner Infectious Diseases Clinical Pharmacist [facility]  

## 2022-04-01 NOTE — Progress Notes (Signed)
Normal pap smear - she is s/p complete hysterectomy for non-malignant reasons. With normal vaginal pap smear and no visual concerns can stop screening now per [facility] and DHHS recommendations

## 2022-04-05 ENCOUNTER — Other Ambulatory Visit: Payer: Self-pay | Admitting: Internal Medicine

## 2022-04-05 DIAGNOSIS — B20 Human immunodeficiency virus [HIV] disease: Secondary | ICD-10-CM

## 2022-04-22 ENCOUNTER — Ambulatory Visit: Payer: 59 | Admitting: Internal Medicine

## 2022-05-12 ENCOUNTER — Encounter: Payer: Self-pay | Admitting: Infectious Diseases

## 2022-05-12 ENCOUNTER — Ambulatory Visit (INDEPENDENT_AMBULATORY_CARE_PROVIDER_SITE_OTHER): Payer: 59 | Admitting: Infectious Diseases

## 2022-05-12 VITALS — BP 138/89 | HR 86 | Temp 99.1°F | Wt 203.6 lb

## 2022-05-12 DIAGNOSIS — E785 Hyperlipidemia, unspecified: Secondary | ICD-10-CM | POA: Diagnosis not present

## 2022-05-12 DIAGNOSIS — I10 Essential (primary) hypertension: Secondary | ICD-10-CM

## 2022-05-12 DIAGNOSIS — F17200 Nicotine dependence, unspecified, uncomplicated: Secondary | ICD-10-CM

## 2022-05-12 DIAGNOSIS — E041 Nontoxic single thyroid nodule: Secondary | ICD-10-CM | POA: Diagnosis not present

## 2022-05-12 DIAGNOSIS — Z124 Encounter for screening for malignant neoplasm of cervix: Secondary | ICD-10-CM

## 2022-05-12 DIAGNOSIS — B20 Human immunodeficiency virus [HIV] disease: Secondary | ICD-10-CM

## 2022-05-12 DIAGNOSIS — F17210 Nicotine dependence, cigarettes, uncomplicated: Secondary | ICD-10-CM

## 2022-05-12 MED ORDER — ATORVASTATIN CALCIUM 10 MG PO TABS: 10.0000 mg | ORAL_TABLET | Freq: Every day | ORAL | 3 refills | Status: DC

## 2022-05-12 NOTE — Assessment & Plan Note (Signed)
Spoke with her about her lipids and ASCVD score Agrees to start statin.  Will see her back in 6 weeks.

## 2022-05-12 NOTE — Assessment & Plan Note (Signed)
She is doing well States she will get Tet and flu next year Encouraged her to get colon.  Will check her CD4 and VL since her meds were changed. Will get her into IM clinic for IM provider.  Rtc in 3 months.

## 2022-05-12 NOTE — Assessment & Plan Note (Signed)
Appreciate NP Dixon's f/u.

## 2022-05-12 NOTE — Assessment & Plan Note (Signed)
>>  ASSESSMENT AND PLAN FOR THYROID  NODULE WRITTEN ON 05/12/2022  3:37 PM BY HATCHER, JEFFREY C, MD  Encouraged her to f/u with ENT.

## 2022-05-12 NOTE — Assessment & Plan Note (Addendum)
Encouraged her to f/u with ENT.

## 2022-05-12 NOTE — Assessment & Plan Note (Signed)
Encouraged to quit. 

## 2022-05-12 NOTE — Assessment & Plan Note (Signed)
Fairly well controlled today Continue on ACE-I

## 2022-05-12 NOTE — Progress Notes (Signed)
Subjective:    Patient ID: Shannon [name], female  DOB: Aug 22, 1965, 56 y.o.        MRN: [id number]   HPI 56 yo F HIV+, previous Chairi 1 repair (2000). Prev taking atripla, changed to [facility] 2016. Takes with food.  Has (still) not had colon ( rescheduled)   She had thyroid scan on 09-10-16 showing: Multinodular thyroid gland demonstrating small warm and cold nodules in both lobes. Last endo appt was 2020, she believes.  Has not had any f/u 2022.  No TSH this year. Has had change in her voice, has sleep apnea (which she thinks is scaring her daughter).    Working from [employer] 2/3 days.  Completed her bachelor's degree in October 2022.  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  Continues to take prozac. Has not found a new therapist. Biting inside of mouth as a nervous habit.  BP up today- off her bp rx. " Has gained 6#. Continues to smoke 1/2 ppd, taking her prozac.   In relationship. Considering (marriage), got fitted for a wedding dress last week. Has been tested.  Brother moved in, not working or helping out. Daughter moved out but needs assistance. Father with ? Alzheimers , CVA, sunsets; mother also with health issues (possible hip surgery, dementia).   Her odefsy was changed to biktarvy. No problems with this.  She was in statin study via actg. She just found out she was on placebo.   "Everything hurts". Commanded to tell her next doctor that everything hurts- feels like I have a fever, for 2 weeks. L Knee, toe, shoulder hurt. No swelling. Knee does get hot.  R knee also hurts from tripping over her kitten 58 month old. Also 53 yo dog.    The 10-year ASCVD risk score (Arnett DK, et al., 2019) is: 17.4%   Values used to calculate the score:     Age: 15 years     Sex: Female     Is Non-Hispanic African American: Yes     Diabetic: No     Tobacco smoker: Yes     Systolic Blood Pressure: 627 mmHg     Is BP treated: Yes     HDL Cholesterol: 44 mg/dL      Total Cholesterol: 216 mg/dL    HIV 1 RNA Quant (Copies/mL)  Date Value  03/12/2022 Not Detected  05/09/2021 <20 (H)  07/23/2020 <20   CD4 T Cell Abs (/uL)  Date Value  03/12/2022 1,175  07/23/2020 1,247  09/21/2019 1,048     Health Maintenance  Topic Date Due   DTaP/Tdap/Td (1 - Tdap) Never done   Zoster Vaccines- Shingrix (1 of 2) Never done   COLONOSCOPY (Pts 45-45yr Insurance coverage will need to be confirmed)  Never done   MAMMOGRAM  01/15/2020   COVID-19 Vaccine (3 - 2023-24 season) 02/06/2022   Hepatitis C Screening  Completed   HIV Screening  Completed   HPV VACCINES  Aged Out   PAP SMEAR-Modifier  Discontinued      Review of Systems  Constitutional:  Positive for fever. Negative for chills and weight loss.  Respiratory:  Negative for cough and shortness of breath.   Cardiovascular:  Negative for chest pain.  Gastrointestinal:  Negative for constipation and diarrhea.  Genitourinary:  Negative for dysuria.  Psychiatric/Behavioral:  The patient does not have insomnia.     Please see HPI. All other systems reviewed and negative.     Objective:  Physical Exam Vitals reviewed.  Constitutional:      Appearance: Normal appearance.  HENT:     Mouth/Throat:     Mouth: Mucous membranes are moist.     Pharynx: Oropharynx is clear. No oropharyngeal exudate.  Cardiovascular:     Rate and Rhythm: Normal rate and regular rhythm.  Pulmonary:     Effort: Pulmonary effort is normal.     Breath sounds: Normal breath sounds.  Abdominal:     General: Bowel sounds are normal. There is no distension.     Palpations: Abdomen is soft.  Musculoskeletal:        General: No deformity. Normal range of motion.     Cervical back: Normal range of motion and neck supple.     Right lower leg: No edema.     Left lower leg: No edema.  Neurological:     General: No focal deficit present.     Mental Status: She is alert.            Assessment & Plan:

## 2022-05-13 LAB — T-HELPER CELLS (CD4) COUNT (NOT AT ARMC)
CD4 % Helper T Cell: 40 % (ref 33–65)
CD4 T Cell Abs: 1397 /uL (ref 400–1790)

## 2022-05-14 LAB — HIV-1 RNA QUANT-NO REFLEX-BLD: HIV-1 RNA Viral Load: <20 copies/mL

## 2022-05-20 ENCOUNTER — Ambulatory Visit
Admission: RE | Admit: 2022-05-20 | Discharge: 2022-05-20 | Disposition: A | Discharge status: Home / Self Care | Payer: 59 | Source: Ambulatory Visit | Attending: Infectious Diseases | Admitting: Infectious Diseases

## 2022-05-20 DIAGNOSIS — Z1231 Encounter for screening mammogram for malignant neoplasm of breast: Secondary | ICD-10-CM

## 2022-05-22 ENCOUNTER — Other Ambulatory Visit: Payer: Self-pay | Admitting: Infectious Diseases

## 2022-05-22 DIAGNOSIS — R928 Other abnormal and inconclusive findings on diagnostic imaging of breast: Secondary | ICD-10-CM

## 2022-06-04 ENCOUNTER — Other Ambulatory Visit: Payer: 59

## 2022-06-10 ENCOUNTER — Ambulatory Visit
Admission: RE | Admit: 2022-06-10 | Discharge: 2022-06-10 | Disposition: A | Discharge status: Home / Self Care | Payer: 59 | Source: Ambulatory Visit | Attending: Infectious Diseases | Admitting: Infectious Diseases

## 2022-06-10 ENCOUNTER — Ambulatory Visit: Admission: RE | Admit: 2022-06-10 | Payer: 59 | Source: Ambulatory Visit

## 2022-06-10 DIAGNOSIS — R928 Other abnormal and inconclusive findings on diagnostic imaging of breast: Secondary | ICD-10-CM

## 2022-06-17 ENCOUNTER — Ambulatory Visit (INDEPENDENT_AMBULATORY_CARE_PROVIDER_SITE_OTHER): Payer: 59 | Admitting: Student

## 2022-06-17 ENCOUNTER — Encounter: Payer: Self-pay | Admitting: Student

## 2022-06-17 ENCOUNTER — Ambulatory Visit (HOSPITAL_COMMUNITY)
Admission: RE | Admit: 2022-06-17 | Discharge: 2022-06-17 | Disposition: A | Discharge status: Home / Self Care | Payer: 59 | Source: Ambulatory Visit | Attending: Student in an Organized Health Care Education/Training Program | Admitting: Student in an Organized Health Care Education/Training Program

## 2022-06-17 VITALS — BP 136/93 | HR 81 | Temp 98.5°F | Ht 62.0 in | Wt 199.8 lb

## 2022-06-17 DIAGNOSIS — R079 Chest pain, unspecified: Secondary | ICD-10-CM | POA: Diagnosis not present

## 2022-06-17 DIAGNOSIS — E119 Type 2 diabetes mellitus without complications: Secondary | ICD-10-CM

## 2022-06-17 DIAGNOSIS — E041 Nontoxic single thyroid nodule: Secondary | ICD-10-CM | POA: Diagnosis not present

## 2022-06-17 DIAGNOSIS — M1710 Unilateral primary osteoarthritis, unspecified knee: Secondary | ICD-10-CM | POA: Diagnosis present

## 2022-06-17 DIAGNOSIS — F3289 Other specified depressive episodes: Secondary | ICD-10-CM | POA: Diagnosis not present

## 2022-06-17 DIAGNOSIS — F17210 Nicotine dependence, cigarettes, uncomplicated: Secondary | ICD-10-CM

## 2022-06-17 DIAGNOSIS — Z1211 Encounter for screening for malignant neoplasm of colon: Secondary | ICD-10-CM | POA: Insufficient documentation

## 2022-06-17 DIAGNOSIS — I10 Essential (primary) hypertension: Secondary | ICD-10-CM

## 2022-06-17 DIAGNOSIS — F17200 Nicotine dependence, unspecified, uncomplicated: Secondary | ICD-10-CM

## 2022-06-17 DIAGNOSIS — M25562 Pain in left knee: Secondary | ICD-10-CM | POA: Diagnosis not present

## 2022-06-17 DIAGNOSIS — E010 Iodine-deficiency related diffuse (endemic) goiter: Secondary | ICD-10-CM

## 2022-06-17 DIAGNOSIS — G8929 Other chronic pain: Secondary | ICD-10-CM

## 2022-06-17 DIAGNOSIS — E785 Hyperlipidemia, unspecified: Secondary | ICD-10-CM

## 2022-06-17 LAB — POCT GLYCOSYLATED HEMOGLOBIN (HGB A1C): Hemoglobin A1C: 7.7 % — AB (ref 4.0–5.6)

## 2022-06-17 LAB — GLUCOSE, CAPILLARY: Glucose-Capillary: 102 mg/dL — ABNORMAL HIGH (ref 70–99)

## 2022-06-17 MED ORDER — BUPROPION HCL ER (SR) 150 MG PO TB12: 150.0000 mg | ORAL_TABLET | Freq: Every day | ORAL | 3 refills | Status: DC

## 2022-06-17 NOTE — Assessment & Plan Note (Addendum)
Based on subjective and exam findings my impression is for osteoarthritis of the left knee.  Manage conservatively for now, as this is more irritating rather than functional limiting at present.  She can continue bracing and NSAIDs on days that the pain is severe.  Will get a knee x-ray to confirm diagnosis.

## 2022-06-17 NOTE — Patient Instructions (Addendum)
Today we discussed knee pain and health promotion.  Try bupropion for your mood and smoking cessation.  Return in 1 month for follow up of bupropion for mood and tobacco cessation.   I will call you with the results of the following laboratory test(s):  Lab Orders         TSH         T4, Free         T3         POC Hbg A1C       Expect a call from the following department(s):  Referral Orders         Ambulatory referral to Gastroenterology     Radiology for knee x-ray   Please call our clinic at [phone] Monday through Friday from 9 am to 4 pm if you have questions or concerns about your health. If after hours or on the weekend, call the main hospital number and ask for the Internal Medicine Resident On-Call. If you need medication refills, please notify your pharmacy one week in advance and they will send [place] a request.   Best, Nani Gasser, [place]

## 2022-06-17 NOTE — Assessment & Plan Note (Addendum)
Has smoked half pack a day for 37 years.  Interested in quitting but has never had success.  Longest time she has been abstinent is for a week.  Will do a trial of bupropion today and follow-up in a month to assess response to therapy.

## 2022-06-17 NOTE — Assessment & Plan Note (Signed)
136/93 today.  With history of HIV this patient's risk of ASCVD events is elevated.  Follow-up and consider addition of a second agent like HCTZ to get systolic below 282.

## 2022-06-17 NOTE — Assessment & Plan Note (Signed)
Overall asymptomatic aside from some mild unintentional weight loss over the last few months.  History of thyroid laboratory monitoring appears consistent with subclinical hyperthyroidism.  On exam she may have some thyromegaly but it is hard to tell.  Will repeat TSH, free T4 and T3 today.

## 2022-06-17 NOTE — Assessment & Plan Note (Addendum)
Review of systems positive for depressed mood and feelings of sadness.  Score of 12 on PHQ-9 today.  Has been treated with fluoxetine in the past but discontinued this because of the intolerable sexual side effects.  She is interested in restarting therapy but wants to avoid agents that may affect her libido.  Given her comorbidity of tobacco abuse and her interest in tobacco cessation, bupropion is an appropriate option for this patient.  I will start 150 mg of bupropion ER 150 mg daily.  Follow-up in 1 month to assess response to this therapy.

## 2022-06-17 NOTE — Assessment & Plan Note (Signed)
Review of systems is positive for some intermittent chest pain.  Occurs with a frequency of about once per week, it is described as sharp and sudden onset.  It lasts a few minutes and resolves on its own.  It is not associated with exertion.  She has no shortness of breath or diaphoresis with this pain.  She has no subjective or objective signs of volume overload.  On exam she has some tenderness over the area of her chest that is painful.  Description of pain is atypical for cardiac type pain.  The exam is reassuring.  I suspect this is due to musculoskeletal type pain rather than cardiac pain.

## 2022-06-17 NOTE — Assessment & Plan Note (Signed)
Currently on a medium intensity statin.  LDL above goal of 100.  Consider advancing to high intensity statin.

## 2022-06-17 NOTE — Progress Notes (Signed)
Subjective:  [name] is a 57 y.o. who presents to clinic for left knee pain and to establish care with this clinic.  Left knee pain Ongoing for a couple of months Has not really responded to over-the-counter therapies like braces, medications, etc. Irritating rather than function limiting  Weight loss 10 to 15 pounds over the last 6 months Unintentional HIV well-controlled No other constitutional symptoms  Review of Systems  Constitutional:  Positive for malaise/fatigue and weight loss. Negative for chills, diaphoresis and fever.  Respiratory:  Negative for cough and shortness of breath.   Cardiovascular:  Positive for chest pain (Once per week, sharp pain of sudden onset, lasts a few minutes, resolves on it's own, not associated with exertion.) and leg swelling. Negative for palpitations and claudication.  Gastrointestinal:  Negative for blood in stool, diarrhea, heartburn, nausea and vomiting.  Genitourinary: Negative.   Musculoskeletal:  Positive for joint pain. Negative for back pain and myalgias.  Skin:  Negative for rash.  Psychiatric/Behavioral:  Positive for depression. Negative for suicidal ideas.    Patient Active Problem List   Diagnosis Date Noted   Type 2 diabetes mellitus without complication, without long-term current use of insulin (Shannon [name]) 06/17/2022   Screening for colon cancer 06/17/2022   Hyperlipidemia LDL goal <100 05/12/2022   Obesity (BMI 30-39.9) 08/08/2020   Chest pain 10/04/2018   Thyromegaly 11/11/2015   Thyroid nodule 08/22/2012   POLYURIA 04/26/2008   Left knee pain 11/12/2006   Human immunodeficiency virus (HIV) disease (Shannon [name]) 04/14/2006   TOBACCO ABUSE 04/14/2006   Depression 04/14/2006   Essential hypertension 04/14/2006    Past Surgical History:  Procedure Laterality Date   ABDOMINAL HYSTERECTOMY     BRAIN SURGERY     CHOLECYSTECTOMY      Allergies  Allergen Reactions   Shellfish Allergy Anaphylaxis    Shrimp       Current Outpatient Medications on File Prior to Visit  Medication Sig Dispense Refill   atorvastatin (LIPITOR) 10 MG tablet Take 1 tablet (10 mg total) by mouth daily. 90 tablet 3   benazepril (LOTENSIN) 10 MG tablet TAKE 1 TABLET(10 MG) BY MOUTH DAILY 90 tablet 3   bictegravir-emtricitabine-tenofovir AF (BIKTARVY) 50-200-25 MG TABS tablet Take 1 tablet by mouth daily. 30 tablet 11   No current facility-administered medications on file prior to visit.    Family History  Problem Relation Age of Onset   Asthma Mother    Hypertension Mother    Diabetes Mother    Diabetes Father    Cancer Father        colon at 19 yo   Breast cancer Maternal Aunt   One daughter 68 years old with diabetes Dad with dementia and diabetes, CVA x 2, colon cancer Mom with diabetes  Social History   Socioeconomic History   Marital status: Single    Spouse name: Not on file   Number of children: Not on file   Years of education: Not on file   Highest education level: Not on file  Occupational History   Not on file  Tobacco Use   Smoking status: Every Day    Packs/day: 0.50    Years: 25.00    Total pack years: 12.50    Types: Cigarettes    Last attempt to quit: 03/24/2017    Years since quitting: 5.2   Smokeless tobacco: Never   Tobacco comments:    Patient trying to quit  Vaping Use   Vaping Use:  Some days  Substance and Sexual Activity   Alcohol use: Yes    Alcohol/week: 1.0 standard drink of alcohol    Types: 1 Standard drinks or equivalent per week    Comment: rare   Drug use: No   Sexual activity: Not Currently    Birth control/protection: Surgical    Comment: declined condoms  Other Topics Concern   Not on file  Social History Narrative   Not on file   Social Determinants of Health   Financial Resource Strain: Not on file  Food Insecurity: Not on file  Transportation Needs: Not on file  Physical Activity: Not on file  Stress: Not on file  Social Connections: Not on file   Intimate Partner Violence: Not on Nurse, learning disability at [employer] Brother lives with her Has a boyfriend, some verbal but no physical abuse 4 drinks per week Half pack per day for 37 years, has tried to quit in the past with longest attempt of 1 week  Objective:   Vitals:   06/17/22 1031  BP: (!) 136/93  Pulse: 81  Temp: 98.5 F (36.9 C)  TempSrc: Oral  SpO2: 100%  Weight: 199 lb 12.8 oz (90.6 kg)  Height: '5\' 2"'$  (1.575 m)    Physical Exam Constitutional:      General: She is not in acute distress.    Appearance: Normal appearance.  Neck:     Thyroid: Thyromegaly present.  Cardiovascular:     Rate and Rhythm: Normal rate and regular rhythm.     Pulses: Normal pulses.     Heart sounds: No murmur heard. Pulmonary:     Effort: Pulmonary effort is normal.     Breath sounds: Normal breath sounds. No stridor.  Musculoskeletal:     Right knee: Normal.     Left knee: Crepitus present. No swelling, effusion or erythema. Normal range of motion. Tenderness (Lateral joint line) present.     Right lower leg: No edema.     Left lower leg: No edema.  Skin:    General: Skin is warm and dry.  Neurological:     Mental Status: She is alert. Mental status is at baseline.  Psychiatric:        Mood and Affect: Mood normal.        Behavior: Behavior normal.    Assessment & Plan:  The primary encounter diagnosis was Chronic pain of left knee. Diagnoses of Other depression, TOBACCO ABUSE, Arthropathy, lower leg, Screening for colon cancer, Thyroid nodule, Type 2 diabetes mellitus without complication, without long-term current use of insulin (Shannon [name]), Essential hypertension, Thyromegaly, Hyperlipidemia LDL goal <100, and Chest pain, unspecified type were also pertinent to this visit.  Depression Review of systems positive for depressed mood and feelings of sadness.  Score of 12 on PHQ-9 today.  Has been treated with fluoxetine in the past but discontinued  this because of the intolerable sexual side effects.  She is interested in restarting therapy but wants to avoid agents that may affect her libido.  Given her comorbidity of tobacco abuse and her interest in tobacco cessation, bupropion is an appropriate option for this patient.  I will start 150 mg of bupropion ER 150 mg daily.  Follow-up in 1 month to assess response to this therapy.  Left knee pain Based on subjective and exam findings my impression is for osteoarthritis of the left knee.  Manage conservatively for now, as this is more irritating rather than functional limiting at present.  She can continue bracing and NSAIDs on days that the pain is severe.  Will get a knee x-ray to confirm diagnosis.  TOBACCO ABUSE Has smoked half pack a day for 37 years.  Interested in quitting but has never had success.  Longest time she has been abstinent is for a week.  Will do a trial of bupropion today and follow-up in a month to assess response to therapy.  Essential hypertension 136/93 today.  With history of HIV this patient's risk of ASCVD events is elevated.  Follow-up and consider addition of a second agent like HCTZ to get systolic below 850.  Thyromegaly Overall asymptomatic aside from some mild unintentional weight loss over the last few months.  History of thyroid laboratory monitoring appears consistent with subclinical hyperthyroidism.  On exam she may have some thyromegaly but it is hard to tell.  Will repeat TSH, free T4 and T3 today.  Hyperlipidemia LDL goal <100 Currently on a medium intensity statin.  LDL above goal of 100.  Consider advancing to high intensity statin.  Chest pain Review of systems is positive for some intermittent chest pain.  Occurs with a frequency of about once per week, it is described as sharp and sudden onset.  It lasts a few minutes and resolves on its own.  It is not associated with exertion.  She has no shortness of breath or diaphoresis with this pain.  She  has no subjective or objective signs of volume overload.  On exam she has some tenderness over the area of her chest that is painful.  Description of pain is atypical for cardiac type pain.  The exam is reassuring.  I suspect this is due to musculoskeletal type pain rather than cardiac pain.  Screening for colon cancer Referral for colonoscopy ordered today.    Return in 1 month for follow up of bupropion for mood and tobacco cessation.  Patient discussed with Dr. Teofilo Pod MD 06/17/2022, 1:20 PM  Pager: [phone]

## 2022-06-17 NOTE — Assessment & Plan Note (Deleted)
Knee

## 2022-06-17 NOTE — Assessment & Plan Note (Signed)
Referral for colonoscopy ordered today.

## 2022-06-18 LAB — TSH: TSH: 0.186 u[IU]/mL — ABNORMAL LOW (ref 0.450–4.500)

## 2022-06-18 LAB — T3: T3, Total: 156 ng/dL (ref 71–180)

## 2022-06-18 LAB — T4, FREE: Free T4: 1.12 ng/dL (ref 0.82–1.77)

## 2022-06-18 NOTE — Progress Notes (Signed)
Results notable for degenerative changes consistent with arthritis.  Patient informed over the phone.  Recommended to continue bracing the knee.  Also recommended NSAIDs on day when the pain is bad.  She reports taking NSAIDs 2-3 times a week.

## 2022-06-18 NOTE — Progress Notes (Signed)
Laboratory studies consistent with subclinical hyperthyroidism.  Appropriate to monitor closely for now as this patient is without symptoms except for perhaps some weight loss.  Check TSH, free T4, and T3 every 6 months.  Hemoglobin A1c 7.7.  This represents an increase from 6.8 in October.  At her follow-up in 1 month it would be reasonable to start metformin and aim for a goal of less than 7% this patient with HIV and other cardiovascular risk factors.  I called the patient and informed them of the results of her laboratory studies over the phone.

## 2022-06-18 NOTE — Progress Notes (Signed)
Internal Medicine Clinic Attending  Case discussed with Dr. McLendon  At the time of the visit.  We reviewed the resident's history and exam and pertinent patient test results.  I agree with the assessment, diagnosis, and plan of care documented in the resident's note.  

## 2022-07-23 ENCOUNTER — Other Ambulatory Visit: Payer: Self-pay

## 2022-07-23 DIAGNOSIS — E785 Hyperlipidemia, unspecified: Secondary | ICD-10-CM

## 2022-07-23 MED ORDER — ATORVASTATIN CALCIUM 10 MG PO TABS: 10.0000 mg | ORAL_TABLET | Freq: Every day | ORAL | 3 refills | Status: DC

## 2022-08-11 ENCOUNTER — Encounter: Payer: 59 | Admitting: Infectious Diseases

## 2022-08-18 ENCOUNTER — Encounter: Payer: Self-pay | Admitting: Dietician

## 2022-08-18 ENCOUNTER — Encounter: Payer: 59 | Admitting: Infectious Diseases

## 2022-09-14 ENCOUNTER — Other Ambulatory Visit: Payer: Self-pay | Admitting: Nurse Practitioner

## 2022-09-14 DIAGNOSIS — I10 Essential (primary) hypertension: Secondary | ICD-10-CM

## 2022-10-16 ENCOUNTER — Other Ambulatory Visit: Payer: Self-pay | Admitting: Student

## 2022-10-16 DIAGNOSIS — F17200 Nicotine dependence, unspecified, uncomplicated: Secondary | ICD-10-CM

## 2022-10-16 DIAGNOSIS — F3289 Other specified depressive episodes: Secondary | ICD-10-CM

## 2022-10-21 NOTE — Telephone Encounter (Signed)
Patient sent message via my chart to contact our office to schedule an appointment. 

## 2022-11-12 ENCOUNTER — Emergency Department (HOSPITAL_COMMUNITY)
Admission: EM | Admit: 2022-11-12 | Discharge: 2022-11-12 | Discharge status: Against Medical Advice | Payer: 59 | Attending: Emergency Medicine | Admitting: Emergency Medicine

## 2022-11-12 ENCOUNTER — Emergency Department (HOSPITAL_COMMUNITY): Payer: 59

## 2022-11-12 ENCOUNTER — Encounter (HOSPITAL_COMMUNITY): Payer: Self-pay | Admitting: Pharmacy Technician

## 2022-11-12 DIAGNOSIS — R079 Chest pain, unspecified: Secondary | ICD-10-CM | POA: Diagnosis present

## 2022-11-12 DIAGNOSIS — I10 Essential (primary) hypertension: Secondary | ICD-10-CM | POA: Insufficient documentation

## 2022-11-12 DIAGNOSIS — Z5321 Procedure and treatment not carried out due to patient leaving prior to being seen by health care provider: Secondary | ICD-10-CM | POA: Diagnosis not present

## 2022-11-12 DIAGNOSIS — R638 Other symptoms and signs concerning food and fluid intake: Secondary | ICD-10-CM | POA: Insufficient documentation

## 2022-11-12 LAB — CBC
HCT: 41.6 % (ref 36.0–46.0)
Hemoglobin: 14.2 g/dL (ref 12.0–15.0)
MCH: 28.7 pg (ref 26.0–34.0)
MCHC: 34.1 g/dL (ref 30.0–36.0)
MCV: 84 fL (ref 80.0–100.0)
Platelets: 217 10*3/uL (ref 150–400)
RBC: 4.95 MIL/uL (ref 3.87–5.11)
RDW: 14.9 % (ref 11.5–15.5)
WBC: 6.9 10*3/uL (ref 4.0–10.5)
nRBC: 0 % (ref 0.0–0.2)

## 2022-11-12 LAB — BASIC METABOLIC PANEL
Anion gap: 8 (ref 5–15)
BUN: 10 mg/dL (ref 6–20)
CO2: 21 mmol/L — ABNORMAL LOW (ref 22–32)
Calcium: 9.4 mg/dL (ref 8.9–10.3)
Chloride: 110 mmol/L (ref 98–111)
Creatinine, Ser: 1.11 mg/dL — ABNORMAL HIGH (ref 0.44–1.00)
GFR, Estimated: 58 mL/min — ABNORMAL LOW (ref >60)
Glucose, Bld: 112 mg/dL — ABNORMAL HIGH (ref 70–99)
Potassium: 4.1 mmol/L (ref 3.5–5.1)
Sodium: 139 mmol/L (ref 135–145)

## 2022-11-12 LAB — TROPONIN I (HIGH SENSITIVITY)
Troponin I (High Sensitivity): 6 ng/L (ref <18)
Troponin I (High Sensitivity): 6 ng/L (ref <18)

## 2022-11-12 NOTE — ED Provider Triage Note (Signed)
Emergency Medicine Provider Triage Evaluation Note  Shannon [name] , a 58 y.o. female  was evaluated in triage.  Pt complains of CP, HTN, brought in by EMS was at church and started to feel weak, has been feeling same for the past few days, progressively worsening, decreased PO intake. Didn't take BP meds, forgot to. Reports feeling "heart-burny" today. ? Decreased urine output.  Review of Systems  Positive:  Negative:   Physical Exam  There were no vitals taken for this visit. Gen:   Awake, no distress   Resp:  Normal effort  MSK:   Moves extremities without difficulty  Other:  Lungs CTA, HR RRR  Medical Decision Making  Medically screening exam initiated at 1:27 PM.  Appropriate orders placed.  Shannon [name] was informed that the remainder of the evaluation will be completed by another provider, this initial triage assessment does not replace that evaluation, and the importance of remaining in the ED until their evaluation is complete.     Jeannie Fend, PA-C 11/12/22 1329

## 2022-11-12 NOTE — ED Triage Notes (Signed)
Pt bib ems from church with weakness for the last few days with elevated BP. Has not had BP meds this morning. "Heartburn" type pain. 12 lead unremarkable. Given 324mg  asa. 180/110 HR 85 97% RA CBG 147

## 2022-11-12 NOTE — ED Notes (Signed)
Pt left [facility] due to long ED wait times. IV removed by nursing staff.

## 2023-02-02 ENCOUNTER — Encounter: Payer: Self-pay | Admitting: Internal Medicine

## 2023-02-02 ENCOUNTER — Ambulatory Visit (INDEPENDENT_AMBULATORY_CARE_PROVIDER_SITE_OTHER): Payer: 59 | Admitting: Internal Medicine

## 2023-02-02 ENCOUNTER — Other Ambulatory Visit: Payer: Self-pay

## 2023-02-02 VITALS — BP 145/90 | HR 89 | Temp 98.6°F | Ht 62.75 in | Wt 191.0 lb

## 2023-02-02 DIAGNOSIS — F17200 Nicotine dependence, unspecified, uncomplicated: Secondary | ICD-10-CM

## 2023-02-02 DIAGNOSIS — B20 Human immunodeficiency virus [HIV] disease: Secondary | ICD-10-CM

## 2023-02-02 DIAGNOSIS — F17210 Nicotine dependence, cigarettes, uncomplicated: Secondary | ICD-10-CM

## 2023-02-02 NOTE — Progress Notes (Unsigned)
   Subjective:    Patient ID: Shannon [name], female    DOB: January 30, 1966, 57 y.o.   MRN: [id number]  HPI Shannon [name] is here for follow up of HIV She continues on [place] and no issues getting or taking it.  No complaints today.  PCP at IM clinic. No complaints.      Review of Systems  Constitutional:  Negative for fatigue.  Gastrointestinal:  Negative for diarrhea and nausea.  Skin:  Negative for rash.       Objective:   Physical Exam Eyes:     General: No scleral icterus. Pulmonary:     Effort: Pulmonary effort is normal.  Skin:    Findings: No rash.  Neurological:     Mental Status: She is alert.   SH: + tobacco        Assessment & Plan:

## 2023-02-03 ENCOUNTER — Encounter: Payer: Self-pay | Admitting: Internal Medicine

## 2023-02-03 LAB — T-HELPER CELL (CD4) - (RCID CLINIC ONLY)
CD4 % Helper T Cell: 39 % (ref 33–65)
CD4 T Cell Abs: 1493 /uL (ref 400–1790)

## 2023-02-04 ENCOUNTER — Encounter: Payer: Self-pay | Admitting: Internal Medicine

## 2023-02-04 LAB — HIV-1 RNA QUANT-NO REFLEX-BLD
HIV 1 RNA Quant: NOT DETECTED {copies}/mL
HIV-1 RNA Quant, Log: NOT DETECTED {Log_copies}/mL

## 2023-02-04 MED ORDER — BICTEGRAVIR-EMTRICITAB-TENOFOV 50-200-25 MG PO TABS: 1.0000 | ORAL_TABLET | Freq: Every day | ORAL | 11 refills | Status: DC

## 2023-02-04 NOTE — Assessment & Plan Note (Addendum)
She is doing well, no issues and will get labs.   Refills provided Offered a flu shot and she has declined for now On a statin She will return in 3 months  I have personally spent 30 minutes involved in face-to-face and non-face-to-face activities for this patient on the day of the visit. Professional time spent includes the following activities: Preparing to see the patient (review of tests), Obtaining and/or reviewing separately obtained history (admission/discharge record), Performing a medically appropriate examination and/or evaluation , Ordering medications/tests/procedures, referring and communicating with other health care professionals, Documenting clinical information in the EMR, Independently interpreting results (not separately reported), Communicating results to the patient/family/caregiver, Counseling and educating the patient/family/caregiver and Care coordination (not separately reported).

## 2023-02-04 NOTE — Assessment & Plan Note (Signed)
Discussed importance of cessation  

## 2023-02-23 ENCOUNTER — Ambulatory Visit (INDEPENDENT_AMBULATORY_CARE_PROVIDER_SITE_OTHER): Payer: Managed Care, Other (non HMO) | Admitting: Medical

## 2023-02-23 ENCOUNTER — Encounter: Payer: Self-pay | Admitting: Medical

## 2023-02-23 VITALS — BP 124/86 | HR 90 | Temp 98.8°F | Resp 18 | Ht 62.5 in | Wt 194.8 lb

## 2023-02-23 DIAGNOSIS — F17200 Nicotine dependence, unspecified, uncomplicated: Secondary | ICD-10-CM | POA: Diagnosis not present

## 2023-02-23 DIAGNOSIS — E785 Hyperlipidemia, unspecified: Secondary | ICD-10-CM | POA: Diagnosis not present

## 2023-02-23 DIAGNOSIS — Z1211 Encounter for screening for malignant neoplasm of colon: Secondary | ICD-10-CM

## 2023-02-23 DIAGNOSIS — R944 Abnormal results of kidney function studies: Secondary | ICD-10-CM

## 2023-02-23 DIAGNOSIS — E119 Type 2 diabetes mellitus without complications: Secondary | ICD-10-CM

## 2023-02-23 DIAGNOSIS — B20 Human immunodeficiency virus [HIV] disease: Secondary | ICD-10-CM

## 2023-02-23 DIAGNOSIS — I10 Essential (primary) hypertension: Secondary | ICD-10-CM | POA: Diagnosis not present

## 2023-02-23 NOTE — Progress Notes (Signed)
Subjective:    Patient ID: Shannon [name], female    DOB: [birth year], 57 y.o.   MRN: [id number]  HPI  Pt in for first time.  In area for 30 years. Shannon [name] moved from [place]. Pt moved for family reasons years ago. She is Health visitor.   Pt admits not healthy diet recently  past 2 weeks . But has been trying to diet and exercise before past 2 weeks. Admits to drinking pepsi once a day. Admits to recently 2-3 shots liquor each day over past month. After dad passed but since stopped.  Smoker- 1/2 pack a day for 30 years.   She has high cholesterol- atorvastatin 10 mg daily.  Htn- benazapril 10 mg daily. She states some work stress today. Dad passed away last month.  8 month ago her A1c increased to 7.7.   Pt needs colonoscopy. Pt dad did have colon cancer after age 75.  Pt has ID. She sees [facility]. Treated for hiv+. Pt is on biktarvy. Specialist wants her to follow up in 3 months.  Decreased gfr 11-12-2022.    Review of Systems  Constitutional:  Negative for chills, fatigue and fever.  HENT:  Negative for congestion, drooling and ear pain.   Respiratory:  Negative for cough, chest tightness, shortness of breath and wheezing.   Cardiovascular:  Negative for chest pain and palpitations.  Gastrointestinal:  Negative for abdominal pain and diarrhea.  Genitourinary:  Negative for dysuria.  Musculoskeletal:  Negative for back pain.  Skin:  Negative for rash.  Neurological:  Negative for dizziness, syncope, weakness and headaches.  Hematological:  Negative for adenopathy.  Psychiatric/Behavioral:  Negative for behavioral problems and dysphoric mood.    Past Medical History:  Diagnosis Date   Arnold-Chiari malformation (HCC)    DVT (deep venous thrombosis) (HCC)    HIV disease (HCC)    Hypertension    Multiple thyroid nodules    Fully functioning thyroid with no treatment indicated   Nephrolithiasis 06/24/2015     Social History   Socioeconomic History    Marital status: Single    Spouse name: Not on file   Number of children: 1   Years of education: Not on file   Highest education level: Not on file  Occupational History   Not on file  Tobacco Use   Smoking status: Every Day    Current packs/day: 0.00    Average packs/day: 0.5 packs/day for 25.0 years (12.5 ttl pk-yrs)    Types: Cigarettes    Start date: 03/24/1992    Last attempt to quit: 03/24/2017    Years since quitting: 5.9   Smokeless tobacco: Never   Tobacco comments:    Patient trying to quit  Vaping Use   Vaping status: Some Days  Substance and Sexual Activity   Alcohol use: Yes    Alcohol/week: 1.0 standard drink of alcohol    Types: 1 Standard drinks or equivalent per week    Comment: rare   Drug use: No   Sexual activity: Not Currently    Birth control/protection: Surgical    Comment: declined condoms  Other Topics Concern   Not on file  Social History Narrative   Not on file   Social Determinants of Health   Financial Resource Strain: Not on file  Food Insecurity: Not on file  Transportation Needs: Not on file  Physical Activity: Not on file  Stress: Not on file  Social Connections: Unknown (10/18/2021)   [facility] from [facility], [facility]  Social Network    Social Network: Not on file  Intimate Partner Violence: Unknown (09/09/2021)   [facility] from [facility], [facility]   HITS    Physically Hurt: Not on file    Insult or Talk Down To: Not on file    Threaten Physical Harm: Not on file    Scream or Curse: Not on file    Past Surgical History:  Procedure Laterality Date   ABDOMINAL HYSTERECTOMY     BRAIN SURGERY     CHOLECYSTECTOMY      Family History  Problem Relation Age of Onset   Asthma Mother    Hypertension Mother    Diabetes Mother    Diabetes Father    Cancer Father        colon at 25 yo   Breast cancer Maternal Aunt     Allergies  Allergen Reactions   Shellfish Allergy Anaphylaxis    Shrimp     Current  Outpatient Medications on File Prior to Visit  Medication Sig Dispense Refill   atorvastatin (LIPITOR) 10 MG tablet Take 1 tablet (10 mg total) by mouth daily. 90 tablet 3   benazepril (LOTENSIN) 10 MG tablet TAKE 1 TABLET(10 MG) BY MOUTH DAILY 90 tablet 3   bictegravir-emtricitabine-tenofovir AF (BIKTARVY) 50-200-25 MG TABS tablet Take 1 tablet by mouth daily. 30 tablet 11   buPROPion (WELLBUTRIN SR) 150 MG 12 hr tablet TAKE 1 TABLET(150 MG) BY MOUTH DAILY 30 tablet 3   No current facility-administered medications on file prior to visit.    BP 124/86 (BP Location: Left Arm, Patient Position: Sitting, Cuff Size: Large)   Pulse 90   Temp 98.8 F (37.1 C) (Oral)   Resp 18   Ht 5' 2.5" (1.588 m)   Wt 194 lb 12.8 oz (88.4 kg)   SpO2 96%   BMI 35.06 kg/m        Objective:   Physical Exam  General Mental Status- Alert. General Appearance- Not in acute distress.   Skin General: Color- Normal Color. Moisture- Normal Moisture.  Neck Carotid Arteries- Normal color. Moisture- Normal Moisture. No carotid bruits. No JVD.  Chest and Lung Exam Auscultation: Breath Sounds:-Normal.  Cardiovascular Auscultation:Rythm- Regular. Murmurs & Other Heart Sounds:Auscultation of the heart reveals- No Murmurs.  Abdomen Inspection:-Inspeection Normal. Palpation/Percussion:Note:No mass. Palpation and Percussion of the abdomen reveal- Non Tender, Non Distended + BS, no rebound or guarding.   Neurologic Cranial Nerve exam:- CN III-XII intact(No nystagmus), symmetric smile. Strength:- 5/5 equal and symmetric strength both upper and lower extremities.       Assessment & Plan:   Patient Instructions  1. Smoker - recommend cutting back to stop eventually in future. Could offer med such wellbutrin when ready to stop/try.   2. Hyperlipidemia, unspecified hyperlipidemia type Continue atrovastatin. Get fasting future labs next wee - Comp Met (CMET); Future - Lipid panel; Future  3.  Hypertension, unspecified type Bp controlled.. continue lotension 10 mg daily.  - Comp Met (CMET); Future  4. Type 2 diabetes mellitus without complication, without long-term current use of insulin (HCC) - low sugar diet. If A1c elevated as last time may rx meds for diabetes. - Hemoglobin A1c; Future  5. Decreased GFR - make sure well hydrated before upcoming labs - Comp Met (CMET); Future  6. Human immunodeficiency virus (HIV) disease (HCC) -keep follow up appointments with Dr. Luciana Axe. Continue biktarvy.  Recommend cut back/stop alcohol use. Particularly over next week prior to cmp lab  Follow up date to be  determined after lab review    Esperanza Richters, PA-C

## 2023-02-23 NOTE — Patient Instructions (Addendum)
1. Smoker - recommend cutting back to stop eventually in future. Could offer med such wellbutrin when ready to stop/try.   2. Hyperlipidemia, unspecified hyperlipidemia type Continue atrovastatin. Get fasting future labs next wee - Comp Met (CMET); Future - Lipid panel; Future  3. Hypertension, unspecified type Bp controlled.. continue lotension 10 mg daily.  - Comp Met (CMET); Future  4. Type 2 diabetes mellitus without complication, without long-term current use of insulin (HCC) - low sugar diet. If A1c elevated as last time may rx meds for diabetes. - Hemoglobin A1c; Future  5. Decreased GFR - make sure well hydrated before upcoming labs - Comp Met (CMET); Future  6. Human immunodeficiency virus (HIV) disease (HCC) -keep follow up appointments with Dr. Luciana Axe. Continue biktarvy.  Recommend cut back/stop alcohol use. Particularly over next week prior to cmp lab  Referral to gi md for screening colonoscopy placed.  Follow up date to be determined after lab review

## 2023-03-03 ENCOUNTER — Other Ambulatory Visit (INDEPENDENT_AMBULATORY_CARE_PROVIDER_SITE_OTHER): Payer: 59

## 2023-03-03 DIAGNOSIS — R944 Abnormal results of kidney function studies: Secondary | ICD-10-CM

## 2023-03-03 DIAGNOSIS — E119 Type 2 diabetes mellitus without complications: Secondary | ICD-10-CM

## 2023-03-03 DIAGNOSIS — I10 Essential (primary) hypertension: Secondary | ICD-10-CM

## 2023-03-03 DIAGNOSIS — E785 Hyperlipidemia, unspecified: Secondary | ICD-10-CM | POA: Diagnosis not present

## 2023-03-03 LAB — LIPID PANEL
Cholesterol: 177 mg/dL (ref 0–200)
HDL: 45 mg/dL (ref >39.00)
LDL Cholesterol: 116 mg/dL — ABNORMAL HIGH (ref 0–99)
NonHDL: 131.74
Total CHOL/HDL Ratio: 4
Triglycerides: 79 mg/dL (ref 0.0–149.0)
VLDL: 15.8 mg/dL (ref 0.0–40.0)

## 2023-03-03 LAB — COMPREHENSIVE METABOLIC PANEL
ALT: 13 U/L (ref 0–35)
AST: 13 U/L (ref 0–37)
Albumin: 4 g/dL (ref 3.5–5.2)
Alkaline Phosphatase: 79 U/L (ref 39–117)
BUN: 12 mg/dL (ref 6–23)
CO2: 26 mEq/L (ref 19–32)
Calcium: 9.3 mg/dL (ref 8.4–10.5)
Chloride: 105 mEq/L (ref 96–112)
Creatinine, Ser: 1.09 mg/dL (ref 0.40–1.20)
GFR: 55.9 mL/min — ABNORMAL LOW (ref >60.00)
Glucose, Bld: 104 mg/dL — ABNORMAL HIGH (ref 70–99)
Potassium: 4.2 mEq/L (ref 3.5–5.1)
Sodium: 139 mEq/L (ref 135–145)
Total Bilirubin: 0.5 mg/dL (ref 0.2–1.2)
Total Protein: 6.6 g/dL (ref 6.0–8.3)

## 2023-03-03 LAB — HEMOGLOBIN A1C: Hgb A1c MFr Bld: 6.7 % — ABNORMAL HIGH (ref 4.6–6.5)

## 2023-03-05 MED ORDER — SAXAGLIPTIN HCL 2.5 MG PO TABS: 2.5000 mg | ORAL_TABLET | Freq: Every day | ORAL | 3 refills | Status: DC

## 2023-03-05 NOTE — Addendum Note (Signed)
Addended by: Gwenevere Abbot on: 03/05/2023 07:04 AM   Modules accepted: Orders

## 2023-03-20 ENCOUNTER — Other Ambulatory Visit: Payer: Self-pay | Admitting: Internal Medicine

## 2023-04-28 ENCOUNTER — Ambulatory Visit: Payer: Managed Care, Other (non HMO) | Admitting: Internal Medicine

## 2023-05-18 ENCOUNTER — Other Ambulatory Visit: Payer: Self-pay | Admitting: Infectious Diseases

## 2023-05-18 DIAGNOSIS — E785 Hyperlipidemia, unspecified: Secondary | ICD-10-CM

## 2023-05-19 NOTE — Telephone Encounter (Signed)
Rx refill atorvastatin sent to pt pharmacy.

## 2023-05-28 ENCOUNTER — Other Ambulatory Visit: Payer: Self-pay

## 2023-05-28 ENCOUNTER — Encounter: Payer: Self-pay | Admitting: Internal Medicine

## 2023-05-28 ENCOUNTER — Ambulatory Visit: Payer: Managed Care, Other (non HMO) | Admitting: Internal Medicine

## 2023-05-28 VITALS — BP 147/93 | HR 84 | Temp 98.5°F | Wt 194.0 lb

## 2023-05-28 DIAGNOSIS — B20 Human immunodeficiency virus [HIV] disease: Secondary | ICD-10-CM

## 2023-05-28 DIAGNOSIS — Z1211 Encounter for screening for malignant neoplasm of colon: Secondary | ICD-10-CM

## 2023-05-28 DIAGNOSIS — F17200 Nicotine dependence, unspecified, uncomplicated: Secondary | ICD-10-CM

## 2023-05-28 DIAGNOSIS — Z720 Tobacco use: Secondary | ICD-10-CM | POA: Diagnosis not present

## 2023-05-28 DIAGNOSIS — I10 Essential (primary) hypertension: Secondary | ICD-10-CM | POA: Diagnosis not present

## 2023-05-28 NOTE — Assessment & Plan Note (Signed)
She is doing well on Biktarvy with no issues.  Previous labs discussed with her.  Will check today and rtc in 6 months Declines vaccines

## 2023-05-28 NOTE — Assessment & Plan Note (Signed)
I discussed the importance of cessation

## 2023-05-28 NOTE — Assessment & Plan Note (Signed)
I have recommended screening colonoscopy and reminded her to make an appointment.

## 2023-05-28 NOTE — Assessment & Plan Note (Signed)
BP is wnl and will continue to monitor

## 2023-05-28 NOTE — Progress Notes (Signed)
   Subjective:    Patient ID: Shannon [name], female    DOB: 1965/08/16, 57 y.o.   MRN: [id number]  HPI Shannon [name] is here for follow up of HIV She continues on Biktarvy with no missed doses.  She has had no issues with getting or taking her medication.  No complaints.  Has a new PCP.    Review of Systems  Constitutional:  Negative for fatigue.  Gastrointestinal:  Negative for diarrhea and nausea.  Skin:  Negative for rash.       Objective:   Physical Exam Eyes:     General: No scleral icterus. Pulmonary:     Effort: Pulmonary effort is normal.  Neurological:     Mental Status: She is alert.   SH: + tobacco        Assessment & Plan:

## 2023-05-31 LAB — HIV-1 RNA QUANT-NO REFLEX-BLD
HIV 1 RNA Quant: NOT DETECTED {copies}/mL
HIV-1 RNA Quant, Log: NOT DETECTED {Log_copies}/mL

## 2023-05-31 LAB — T-HELPER CELLS (CD4) COUNT (NOT AT ARMC)
Absolute CD4: 1518 {cells}/uL (ref 490–1740)
CD4 T Helper %: 39 % (ref 30–61)
Total lymphocyte count: 3916 {cells}/uL — ABNORMAL HIGH (ref 850–3900)

## 2023-09-22 ENCOUNTER — Encounter: Admitting: Medical

## 2023-09-29 ENCOUNTER — Other Ambulatory Visit: Payer: Self-pay | Admitting: Student

## 2023-09-29 DIAGNOSIS — F17200 Nicotine dependence, unspecified, uncomplicated: Secondary | ICD-10-CM

## 2023-09-29 DIAGNOSIS — F3289 Other specified depressive episodes: Secondary | ICD-10-CM

## 2023-09-30 NOTE — Telephone Encounter (Signed)
 NOT [facility] PATIENT

## 2023-10-13 ENCOUNTER — Other Ambulatory Visit: Payer: Self-pay

## 2023-10-13 ENCOUNTER — Encounter: Admitting: *Deleted

## 2023-10-13 VITALS — BP 137/91 | HR 69 | Temp 98.0°F | Wt 190.0 lb

## 2023-10-13 DIAGNOSIS — Z006 Encounter for examination for normal comparison and control in clinical research program: Secondary | ICD-10-CM

## 2023-10-14 NOTE — Research (Signed)
 Shannon [name] had her screening visit for [id number]: A Phase 2b, Randomized, Active-Controlled, Open-Label Clinical Study to Evaluate a Switch to Islatravir (ISL) and Ulonivirine (ULO) Once Weekly in Adults with HIV-1 Virologically Suppressed on Bictefravir/Emtricitabine /Tenofovir  Alafinamide (BIC/FTC/TAF) Once Daily. After verifying the correct consent version, I explained/reviewed the informed consent. Risk, benefits, responsibilities and other options were discussed. I answered her questions. She verbalized understanding and signed the consent witnessed by me. Signed informed consent was obtained prior to any procedures being done. All study procedures completed per protocol. BP elevated, 149/91. Rechecked after an additional 5 minute rest period, BP 137/91. States that she took her BP medication last night. Denies any headache, visual changes, chest pain or dizziness. She was stable at the end of the visit, no complaints. Will schedule Day 1/Entry visit once eligibility has been confirmed.

## 2023-10-25 ENCOUNTER — Encounter: Payer: Self-pay | Admitting: Physician Assistant

## 2023-10-26 NOTE — Progress Notes (Signed)
 The 10-year ASCVD risk score (Arnett DK, et al., 2019) is: 32.8%   Values used to calculate the score:     Age: 58 years     Sex: Female     Is Non-Hispanic African American: Yes     Diabetic: Yes     Tobacco smoker: Yes     Systolic Blood Pressure: 137 mmHg     Is BP treated: Yes     HDL Cholesterol: 45 mg/dL     Total Cholesterol: 177 mg/dL  Currently prescribed atorvastatin  10 mg.   Dean Goldner, BSN, RN

## 2023-11-04 ENCOUNTER — Other Ambulatory Visit: Payer: Self-pay

## 2023-11-04 ENCOUNTER — Encounter: Admitting: *Deleted

## 2023-11-04 VITALS — BP 135/91 | HR 68 | Temp 97.9°F | Resp 16 | Ht 62.99 in | Wt 189.6 lb

## 2023-11-04 DIAGNOSIS — Z006 Encounter for examination for normal comparison and control in clinical research program: Secondary | ICD-10-CM

## 2023-11-04 MED ORDER — STUDY - MK-8591B-060 - BICTEGRAVIR/EMTRICITABINE/TENOFOVIR ALFENAMIDE (BIKTARVY) 50-200-25 MG TABLET (PI-VAN DAM)
1.0000 | ORAL_TABLET | Freq: Every day | ORAL | 0 refills | Status: DC
Start: 2023-11-04 — End: 2023-12-06

## 2023-11-04 NOTE — Research (Signed)
 Icie here today for her entry visit for the Merck study, [id number] A Phase 2b, Randomized, Active-Controlled, Open-Label Clinical Study to Evaluate a Switch to Islatravir (ISL) and Ulonivirine (ULO) Once Weekly in Adults with HIV-1 Virologically Suppressed on Bictefravir/Emtricitabine /Tenofovir  Alafinamide (BIC/FTC/TAF) Once Daily . Eligibility was verified prior to randomization. All procedure completed per protocol. She was randomized to receive Biktarvy . Study medication was dispensed. Reviewed dosing and possible side effects related to study medications. Provided him with contact numbers for research staff should he have questions or concerns. She will return in July for his study visit.

## 2023-11-05 ENCOUNTER — Telehealth: Payer: Self-pay

## 2023-11-05 DIAGNOSIS — I10 Essential (primary) hypertension: Secondary | ICD-10-CM

## 2023-11-05 NOTE — Telephone Encounter (Signed)
 Patient called office 5/29 requesting appt and refill for Benazepril . States she has not been able to setup appt with Dr. Alwin Baars for primary care and was told by staff to call back in two weeks when schedule is open. Was not able to schedule with Dr. Mclendon either who has seen pt previously.  Spoke with patient and advised she try to setup appt with any provider at [facility] for appt and refills on maintenance medication. Requested she ask for temporary refill while she waits on appt.  Verbalized understanding. No questions at this time.  Julien Odor, RMA

## 2023-11-05 NOTE — Telephone Encounter (Deleted)
 Copied from CRM [id number]. Topic: Clinical - Medication Refill >> Nov 05, 2023  9:29 AM Latavia C wrote: Medication: benazepril  (LOTENSIN ) 10 MG tablet  Has the patient contacted their pharmacy? No (Agent: If no, request that the patient contact the pharmacy for the refill. If patient does not wish to contact the pharmacy document the reason why and proceed with request.) (Agent: If yes, when and what did the pharmacy advise?)  This is the patient's preferred pharmacy:  [id number] - [place], [place] - [address] AT [facility] [address] [place] [address] Phone: [phone] Fax: [phone]  Is this the correct pharmacy for this prescription? Yes If no, delete pharmacy and type the correct one.   Has the prescription been filled recently? No  Is the patient out of the medication? Yes  Has the patient been seen for an appointment in the last year OR does the patient have an upcoming appointment? No  Can we respond through MyChart? Yes  Agent: Please be advised that Rx refills may take up to 3 business days. We ask that you follow-up with your pharmacy.

## 2023-11-06 ENCOUNTER — Other Ambulatory Visit: Payer: Self-pay | Admitting: Student

## 2023-11-06 DIAGNOSIS — I10 Essential (primary) hypertension: Secondary | ICD-10-CM

## 2023-11-09 ENCOUNTER — Other Ambulatory Visit: Payer: Self-pay

## 2023-11-09 DIAGNOSIS — F3289 Other specified depressive episodes: Secondary | ICD-10-CM

## 2023-11-09 DIAGNOSIS — I10 Essential (primary) hypertension: Secondary | ICD-10-CM

## 2023-11-09 DIAGNOSIS — F17200 Nicotine dependence, unspecified, uncomplicated: Secondary | ICD-10-CM

## 2023-11-11 ENCOUNTER — Other Ambulatory Visit: Payer: Self-pay | Admitting: *Deleted

## 2023-11-11 ENCOUNTER — Ambulatory Visit: Payer: Self-pay

## 2023-11-11 ENCOUNTER — Other Ambulatory Visit: Payer: Self-pay

## 2023-11-11 DIAGNOSIS — B20 Human immunodeficiency virus [HIV] disease: Secondary | ICD-10-CM

## 2023-11-11 DIAGNOSIS — Z113 Encounter for screening for infections with a predominantly sexual mode of transmission: Secondary | ICD-10-CM

## 2023-11-11 DIAGNOSIS — I10 Essential (primary) hypertension: Secondary | ICD-10-CM

## 2023-11-11 MED ORDER — BENAZEPRIL HCL 10 MG PO TABS
ORAL_TABLET | ORAL | 0 refills | Status: DC
Start: 2023-11-11 — End: 2024-01-05

## 2023-11-11 NOTE — Addendum Note (Signed)
 Addended by: Minta Fair M on: 11/11/2023 03:18 PM   Modules accepted: Orders

## 2023-11-11 NOTE — Telephone Encounter (Signed)
 I called pt back. LOV - 06/17/22 with Dr Mclendon and 05/12/22 with Dr Alwin Baars. Pt informed she has not here in over 1 yr and she needs to schedule an appt. Call transferred to the front office - Appt scheduled with McLendon 6/27 and Dr Lezlie Reddish schedule is not available at this time.

## 2023-11-11 NOTE — Telephone Encounter (Signed)
 FYI Only or Action Required?: Action required by provider  Patient was last seen in primary care on 02/23/2023 by Sylvia Everts, PA-C. Called Nurse Triage reporting Dizziness. Symptoms began several weeks ago. Interventions attempted: Nothing. Symptoms are: unchanged.  Triage Disposition: See PCP When Office is Open (Within 3 Days)  Patient/caregiver understands and will follow disposition?: Yes, will follow disposition  Copied from CRM [id number]. Topic: Clinical - Red Word Triage >> Nov 11, 2023  2:34 PM Tisa Forester wrote: Red Word that prompted transfer to Nurse Triage: patient stated she feel lightheaded, head fog and tireness , she been with out her blood pressure medication for about 6 days to a week  Waiting on her medication refill for the  benazepril  (LOTENSIN ) 10 MG tablet  Patient call back number [phone] Reason for Disposition  [1] MILD dizziness (e.g., walking normally) AND [2] has NOT been evaluated by doctor (or NP/PA) for this  (Exception: Dizziness caused by heat exposure, sudden standing, or poor fluid intake.)  Answer Assessment - Initial Assessment Questions 1. DESCRIPTION: "Describe your dizziness."     Tired, foggy 2. LIGHTHEADED: "Do you feel lightheaded?" (e.g., somewhat faint, woozy, weak upon standing)     Room a little spiny, especially when standing,  3. VERTIGO: "Do you feel like either you or the room is spinning or tilting?" (i.e. vertigo)     Room is spinning 4. SEVERITY: "How bad is it?"  "Do you feel like you are going to faint?" "Can you stand and walk?"   - MILD: Feels slightly dizzy, but walking normally.   - MODERATE: Feels unsteady when walking, but not falling; interferes with normal activities (e.g., school, work).   - SEVERE: Unable to walk without falling, or requires assistance to walk without falling; feels like passing out now.      mild 5. ONSET:  "When did the dizziness begin?"     Couple days, maybe a week, maybe 2 6. AGGRAVATING FACTORS:  "Does anything make it worse?" (e.g., standing, change in head position)     Standing, getting up to quick makes it worse 7. HEART RATE: "Can you tell me your heart rate?" "How many beats in 15 seconds?"  (Note: not all patients can do this)       Unsure of BP, HR is 82 8. CAUSE: "What do you think is causing the dizziness?"     Unsure, has not had benazepril  for about a week 10. OTHER SYMPTOMS: "Do you have any other symptoms?" (e.g., fever, chest pain, vomiting, diarrhea, bleeding)       Denies  Protocols used: Dizziness - Lightheadedness-A-AH

## 2023-11-11 NOTE — Telephone Encounter (Signed)
 Patient called office stating she has not been able to get refills on Benazepril . Has attempted to call IM for refill but was told she needed to be seen before refills can be authorized. Is requesting 30 day supply until her appt with IM on 6/27. Refills sent in. Understands she will need to keep appt with IM clinic for additional refills.  Julien Odor, RMA

## 2023-11-11 NOTE — Telephone Encounter (Signed)
 Copied from CRM [id number]. Topic: Clinical - Prescription Issue >> Nov 11, 2023  2:27 PM Shannon [name] wrote: Reason for CRM: patient calling on the status of her refill medication for the   benazepril  (LOTENSIN ) 10 MG tablet Patient been out of her medication for a week Can someone call patient back on status   Patient callback [phone]

## 2023-11-13 ENCOUNTER — Encounter: Payer: Self-pay | Admitting: Student

## 2023-11-17 ENCOUNTER — Other Ambulatory Visit

## 2023-11-22 MED ORDER — BENAZEPRIL HCL 10 MG PO TABS
ORAL_TABLET | ORAL | 3 refills | Status: DC
Start: 2023-11-22 — End: 2024-01-05

## 2023-11-29 ENCOUNTER — Ambulatory Visit: Admitting: Infectious Diseases

## 2023-12-03 ENCOUNTER — Encounter: Admitting: Student

## 2023-12-06 ENCOUNTER — Encounter: Admitting: *Deleted

## 2023-12-06 ENCOUNTER — Encounter: Payer: Self-pay | Admitting: Infectious Diseases

## 2023-12-06 ENCOUNTER — Ambulatory Visit (INDEPENDENT_AMBULATORY_CARE_PROVIDER_SITE_OTHER): Admitting: Infectious Diseases

## 2023-12-06 ENCOUNTER — Other Ambulatory Visit: Payer: Self-pay

## 2023-12-06 VITALS — BP 139/87 | HR 78 | Temp 99.4°F | Wt 192.0 lb

## 2023-12-06 DIAGNOSIS — B20 Human immunodeficiency virus [HIV] disease: Secondary | ICD-10-CM | POA: Diagnosis not present

## 2023-12-06 DIAGNOSIS — U071 COVID-19: Secondary | ICD-10-CM | POA: Diagnosis not present

## 2023-12-06 DIAGNOSIS — Z1231 Encounter for screening mammogram for malignant neoplasm of breast: Secondary | ICD-10-CM

## 2023-12-06 DIAGNOSIS — Z006 Encounter for examination for normal comparison and control in clinical research program: Secondary | ICD-10-CM

## 2023-12-06 MED ORDER — STUDY - MK-8591B-060 - BICTEGRAVIR/EMTRICITABINE/TENOFOVIR ALFENAMIDE (BIKTARVY) 50-200-25 MG TABLET (PI-VAN DAM): 1.0000 | ORAL_TABLET | Freq: Every day | ORAL | 0 refills | Status: DC

## 2023-12-06 MED ORDER — BENZONATATE 100 MG PO CAPS: 100.0000 mg | ORAL_CAPSULE | Freq: Four times a day (QID) | ORAL | 0 refills | Status: DC | PRN

## 2023-12-06 NOTE — Research (Signed)
 Shannon [name] here today for her Week 4 visit for the Merck study, [id number] A Phase 2b, Randomized, Active-Controlled, Open-Label Clinical Study to Evaluate a Switch to Islatravir (ISL) and Ulonivirine (ULO) Once Weekly in Adults with HIV-1 Virologically Suppressed on Bictefravir/Emtricitabine /Tenofovir  Alafinamide (BIC/FTC/TAF) Once Daily . All study procedure completed per protocol. Study IP dispensed. Excellent adherence with her study medication. She will return in July for his study visit.

## 2023-12-06 NOTE — Patient Instructions (Addendum)
 Continue working with the research team!   Please schedule a vaccine visit for same day when you come see research - this will be for your last pneumonia vaccine and one more attempt to get hepatitis B immunity with the better version of the shot. This is 2 doses 4-8 weeks apart   Smoking Cessation: Quitline[place] 1-800-QUIT-NOW 915-077-3333); Espaol: 1-855-Djelo-Ya (1-769 591 8871) [email or web address]

## 2023-12-06 NOTE — Progress Notes (Signed)
 Name: Shannon [name]   DOB: December 06, 1965 MRN: [id number] PCP: No primary care provider on file.    Brief Narrative:  Shannon [name]  is a 58 y.o. female with HIV, Stage 2, diagnosed on 2000. CD4 nadir unknown  VL unknown Transmission Risk: sexual History of OIs: Shingles 2018 History of STIs: Hep B sAg (-), sAb (-), cAb (-); Hep A (+), Hep C (-) Quantiferon (-) HLA B*5701 () G6PD: ()  Previous Regimens: Atripla 2012 Odefsey  2016  Genotypes: 2012 - Atripla    Subjective  Chief Complaint  Patient presents with   Follow-up     Discussed the use of AI scribe software for clinical note transcription with the patient, who gave verbal consent to proceed.  History of Present Illness   Shannon [name]  is a 58 year old female with HIV who presents for routine follow-up care.  She continues to take Biktarvy  once daily and is currently involved in a Merck trial. Her HIV care is stable, and no additional blood work is needed today as research has recently conducted tests. Her T cell count will be updated later this year due to recent COVID-19 infection.  She recently recovered from COVID-19, which she contracted a month ago. She describes the experience as a 'horrible sinus infection' with symptoms affecting her chest, including a persistent cough that occasionally disrupts her sleep. She experienced a couple of days of high fever and describes the onset of symptoms as a sudden cough that progressed to feeling unwell. She estimates she is 'eighty-two percent' recovered. She also experienced lower back aches and a bad headache during the illness. No history of asthma or lung disease. She reports chest pain localized to the center, which she attributes to coughing.  She has a [employer] dog and a boyfriend who can assist with transportation if needed. She has a family history of colon cancer, with her father having been affected. She has not yet undergone a colonoscopy  despite her family history.  She has previously received pneumonia vaccines. She had a hepatitis B vaccine in 2015. She has a history of hysterectomy and no longer requires Pap smears. She is due for a mammogram and is planning to schedule it soon. She also has some skin lesions that she monitors for changes, as she is concerned about keloid formation.      Working with research team at [facility] for [facility] [id number] and enrolled in May of this year.   LOV with Dr. Efrain in December 2024 - Doing well on biktarvy  at that time.   Pap smear in 2019 and 2023 normal - She has had hysterectomy and vaginal testing at this visit was normal.  No longer needs screenings   She is on BP medication and statin regularly (Lipitor 10 mg)      12/06/2023    1:53 PM  Depression screen PHQ 2/9  Decreased Interest 2  Down, Depressed, Hopeless 2  PHQ - 2 Score 4  Altered sleeping 2  Tired, decreased energy 3  Change in appetite 2  Feeling bad or failure about yourself  1  Trouble concentrating 1  Moving slowly or fidgety/restless 1  Suicidal thoughts 0  PHQ-9 Score 14  Difficult doing work/chores Somewhat difficult    Review of Systems  Constitutional:  Negative for chills, fever, malaise/fatigue and weight loss.  HENT:  Negative for sore throat.        No dental problems  Respiratory:  Negative for cough and sputum production.  Cardiovascular:  Negative for chest pain and leg swelling.  Gastrointestinal:  Negative for abdominal pain, diarrhea and vomiting.  Genitourinary:  Negative for dysuria and flank pain.  Musculoskeletal:  Negative for joint pain, myalgias and neck pain.  Skin:  Negative for rash.  Neurological:  Negative for dizziness, tingling and headaches.  Psychiatric/Behavioral:  Negative for depression and substance abuse. The patient is not nervous/anxious and does not have insomnia.     Past Medical History:  Diagnosis Date   Arnold-Chiari malformation (HCC)    DVT (deep venous  thrombosis) (HCC)    HIV disease (HCC)    Hypertension    Multiple thyroid  nodules    Fully functioning thyroid  with no treatment indicated   Nephrolithiasis 06/24/2015    Outpatient Medications Prior to Visit  Medication Sig Dispense Refill   albuterol (VENTOLIN HFA) 108 (90 Base) MCG/ACT inhaler SMARTSIG:2 Puff(s) By Mouth 4 Times Daily PRN     atorvastatin  (LIPITOR) 10 MG tablet TAKE 1 TABLET(10 MG) BY MOUTH DAILY 90 tablet 3   benazepril  (LOTENSIN ) 10 MG tablet TAKE 1 TABLET(10 MG) BY MOUTH DAILY 90 tablet 3   benazepril  (LOTENSIN ) 10 MG tablet TAKE 1 TABLET(10 MG) BY MOUTH DAILY 90 tablet 3   benazepril  (LOTENSIN ) 10 MG tablet TAKE 1 TABLET(10 MG) BY MOUTH DAILY 30 tablet 0   buPROPion  (WELLBUTRIN  SR) 150 MG 12 hr tablet TAKE 1 TABLET(150 MG) BY MOUTH DAILY 30 tablet 3   Study - [id number] - bictegravir/emtrictabine/tenofovir  alafenamide (BIKTARVY ) 50-200-25 mg tablet (PI-[place]) Take 1 tablet by mouth daily. For Investigational Use Only. Take with or without food. Take at the same time each day. Bring back all bottles and study medicine diary to each visit. 35 tablet 0   saxagliptin  HCl (ONGLYZA) 2.5 MG TABS tablet Take 1 tablet (2.5 mg total) by mouth daily. (Patient not taking: Reported on 12/06/2023) 30 tablet 3   No facility-administered medications prior to visit.     Allergies  Allergen Reactions   Shellfish Allergy Anaphylaxis    Shrimp     Social History   Tobacco Use   Smoking status: Every Day    Current packs/day: 0.00    Average packs/day: 0.5 packs/day for 25.0 years (12.5 ttl pk-yrs)    Types: Cigarettes    Start date: 03/24/1992    Last attempt to quit: 03/24/2017    Years since quitting: 6.7   Smokeless tobacco: Never   Tobacco comments:    Patient trying to quit  Vaping Use   Vaping status: Some Days  Substance Use Topics   Alcohol use: Not Currently    Alcohol/week: 1.0 standard drink of alcohol    Types: 1 Standard drinks or equivalent per  week    Comment: since dad passed 2-3 shots a day. but last week none   Drug use: No    Family History  Problem Relation Age of Onset   Asthma Mother    Hypertension Mother    Diabetes Mother    Diabetes Father    Cancer Father        colon at 71 yo   Breast cancer Maternal Aunt     Social History   Substance and Sexual Activity  Sexual Activity Not Currently   Birth control/protection: Surgical   Comment: declined condoms        Objective  Vitals:   12/06/23 1258  BP: 139/87  Pulse: 78  Temp: 99.4 F (37.4 C)  TempSrc: Oral  SpO2: 94%  Weight:  192 lb (87.1 kg)   Body mass index is 34.02 kg/m.  Physical Exam Vitals reviewed.  Constitutional:      Appearance: Normal appearance. She is not ill-appearing.  HENT:     Mouth/Throat:     Mouth: Mucous membranes are moist.     Pharynx: Oropharynx is clear.   Eyes:     General: No scleral icterus.   Cardiovascular:     Rate and Rhythm: Normal rate.  Pulmonary:     Effort: Pulmonary effort is normal.   Neurological:     Mental Status: She is oriented to person, place, and time.   Psychiatric:        Mood and Affect: Mood normal.        Thought Content: Thought content normal.        Assessment and Plan    HIV infection -  Continues on Biktarvy  once daily. Recently enrolled in a Merck trial comparing Biktarvy  and a new combination pill. No need for additional blood work today as [employer] has recently conducted tests. Plan to update T cell count later this year after recovery from COVID-19 to avoid disqualification from the study due to potentially low immune cells. - Continue Biktarvy  once daily - Update T cell count later this year after recovery from COVID-19  Recent COVID-19 - Recent COVID-19 infection with symptoms of sinus infection, chest congestion, and persistent cough. Reports 82% improvement but still experiences some cough and fatigue. No asthma or lung disease. Lung examination  normal. Discussed potential for secondary bacterial infection post-viral infection. Advised on symptomatic relief with Tessalon  Perles and Mucinex. Discussed use of ibuprofen  or Aleve for chest pain due to coughing. Advised to monitor for worsening symptoms such as increased chest pain, fatigue, fever, or sweats. - Prescribe Tessalon  Perles for cough, to be taken up to three times a day, preferably before bed - Recommend Mucinex for chest congestion - Advise use of ibuprofen  or Aleve for chest pain - Monitor for worsening symptoms and report if she occurs  Hepatitis B vaccination - Previously attempted older hepatitis B vaccine. Discussed newer vaccine that provides better immunity. Plan to administer when she feels better. - Administer newer hepatitis B vaccine when she feels better  Pneumonia vaccination - Due for pneumonia vaccine booster. Discussed scheduling a vaccine visit for administration. - Schedule a vaccine visit for pneumonia booster  General Health Maintenance - No need for Pap smears post-hysterectomy. Mammogram and colonoscopy are due. Discussed scheduling these screenings. - Order mammogram - Discuss colonoscopy scheduling     Depression Screening -  Acutely reflective of recent COVID 19 infection - she says it's not usually that bad   Orders Placed This Encounter  Procedures   MM DIGITAL SCREENING BILATERAL    Standing Status:   Future    Expected Date:   12/06/2023    Expiration Date:   12/05/2024    Reason for Exam (SYMPTOM  OR DIAGNOSIS REQUIRED):   screening for breast cancer    Is the patient pregnant?:   No    Preferred imaging location?:   [facility]    Meds ordered this encounter  Medications   benzonatate  (TESSALON  PERLES) 100 MG capsule    Sig: Take 1 capsule (100 mg total) by mouth every 6 (six) hours as needed for cough.    Dispense:  30 capsule    Refill:  0    Return in about 6 months (around 06/06/2024).  Corean Fireman, MSN,  NP-C [facility]  for Infectious Disease [facility]  [email or web address] Pager: [phone] Office: [phone] [facility] Main Line: [phone] *Secure Chat Communication Welcome

## 2023-12-08 ENCOUNTER — Encounter

## 2023-12-09 ENCOUNTER — Telehealth: Payer: Self-pay | Admitting: Medical

## 2023-12-09 DIAGNOSIS — E785 Hyperlipidemia, unspecified: Secondary | ICD-10-CM

## 2023-12-09 MED ORDER — ATORVASTATIN CALCIUM 10 MG PO TABS
10.0000 mg | ORAL_TABLET | Freq: Every day | ORAL | 0 refills | Status: DC
Start: 2023-12-09 — End: 2024-01-05

## 2023-12-09 NOTE — Telephone Encounter (Signed)
 Pt seen Saguier for a new pt appt but her chart doesn't have a PCP on file. atorvastatin  (LIPITOR) 10 MG tablet it was prescribed by another Dr. Rosalea you recommend a office visit for this refill request?  PLEASE ADVISE Prescription Request  12/09/2023  LOV: 02/23/2023  What is the name of the medication or equipment? atorvastatin  (LIPITOR) 10 MG tablet     Please advise at Mobile [phone] (mobile)

## 2023-12-09 NOTE — Telephone Encounter (Signed)
 Medication sent for 30 days , pt called and lvm to return call and schedule an appt for further refills and labs

## 2023-12-09 NOTE — Telephone Encounter (Signed)
 Copied from CRM [id number]. Topic: Clinical - Medication Question >> Dec 09, 2023  9:32 AM Revonda D wrote: Reason for CRM: Pt is calling to get the atorvastatin  (LIPITOR) 10 MG tablet refilled. Pt stated that her insurance won't cover the 90 day supply and the refills she currently has on it. Pt stated that she would need a 30 day for them to cover the medication. Pt stated that she is completely out and needs the medication as soon as possible.  UPDATE: Pt was prescribed the medication by Dr.Edward Saguier and stated that she is still a pt with him but the chart isn't showing him as the PCP. Pt would like to have the corrected because she didn't remove him from being the PCP.

## 2023-12-17 ENCOUNTER — Encounter

## 2023-12-19 ENCOUNTER — Other Ambulatory Visit: Payer: Self-pay | Admitting: Infectious Diseases

## 2023-12-19 DIAGNOSIS — I10 Essential (primary) hypertension: Secondary | ICD-10-CM

## 2023-12-20 NOTE — Telephone Encounter (Signed)
 Patient is established with PCP.   Caylen Kuwahara, BSN, RN

## 2023-12-30 ENCOUNTER — Encounter: Admitting: *Deleted

## 2024-01-04 ENCOUNTER — Encounter: Admitting: *Deleted

## 2024-01-04 ENCOUNTER — Other Ambulatory Visit: Payer: Self-pay

## 2024-01-04 DIAGNOSIS — Z006 Encounter for examination for normal comparison and control in clinical research program: Secondary | ICD-10-CM

## 2024-01-04 MED ORDER — STUDY - MK-8591B-060 - BICTEGRAVIR/EMTRICITABINE/TENOFOVIR ALFENAMIDE (BIKTARVY) 50-200-25 MG TABLET (PI-VAN DAM): 1.0000 | ORAL_TABLET | Freq: Every day | ORAL | 0 refills | Status: DC

## 2024-01-04 NOTE — Research (Signed)
 Shannon [name] here today for her Week 8 visit for the Merck study, [id number] A Phase 2b, Randomized, Active-Controlled, Open-Label Clinical Study to Evaluate a Switch to Islatravir (ISL) and Ulonivirine (ULO) Once Weekly in Adults with HIV-1 Virologically Suppressed on Bictefravir/Emtricitabine /Tenofovir  Alafinamide (BIC/FTC/TAF) Once Daily . All study procedure completed per protocol. Study IP dispensed. She will return in August for her next study visit.

## 2024-01-05 ENCOUNTER — Ambulatory Visit: Admitting: Student

## 2024-01-05 VITALS — BP 150/92 | HR 74 | Ht 63.0 in | Wt 191.8 lb

## 2024-01-05 DIAGNOSIS — F3289 Other specified depressive episodes: Secondary | ICD-10-CM

## 2024-01-05 DIAGNOSIS — E119 Type 2 diabetes mellitus without complications: Secondary | ICD-10-CM | POA: Diagnosis not present

## 2024-01-05 DIAGNOSIS — Z1211 Encounter for screening for malignant neoplasm of colon: Secondary | ICD-10-CM

## 2024-01-05 DIAGNOSIS — E049 Nontoxic goiter, unspecified: Secondary | ICD-10-CM

## 2024-01-05 DIAGNOSIS — Z6833 Body mass index (BMI) 33.0-33.9, adult: Secondary | ICD-10-CM

## 2024-01-05 DIAGNOSIS — Z833 Family history of diabetes mellitus: Secondary | ICD-10-CM

## 2024-01-05 DIAGNOSIS — F17200 Nicotine dependence, unspecified, uncomplicated: Secondary | ICD-10-CM

## 2024-01-05 DIAGNOSIS — E785 Hyperlipidemia, unspecified: Secondary | ICD-10-CM | POA: Diagnosis not present

## 2024-01-05 DIAGNOSIS — E010 Iodine-deficiency related diffuse (endemic) goiter: Secondary | ICD-10-CM

## 2024-01-05 DIAGNOSIS — E669 Obesity, unspecified: Secondary | ICD-10-CM

## 2024-01-05 DIAGNOSIS — I10 Essential (primary) hypertension: Secondary | ICD-10-CM | POA: Diagnosis not present

## 2024-01-05 DIAGNOSIS — Z8249 Family history of ischemic heart disease and other diseases of the circulatory system: Secondary | ICD-10-CM

## 2024-01-05 DIAGNOSIS — Z79899 Other long term (current) drug therapy: Secondary | ICD-10-CM

## 2024-01-05 DIAGNOSIS — F17210 Nicotine dependence, cigarettes, uncomplicated: Secondary | ICD-10-CM

## 2024-01-05 LAB — GLUCOSE, CAPILLARY: Glucose-Capillary: 128 mg/dL — ABNORMAL HIGH (ref 70–99)

## 2024-01-05 LAB — POCT GLYCOSYLATED HEMOGLOBIN (HGB A1C): Hemoglobin A1C: 6.8 % — AB (ref 4.0–5.6)

## 2024-01-05 MED ORDER — BENAZEPRIL HCL 10 MG PO TABS: ORAL_TABLET | ORAL | 3 refills | Status: AC

## 2024-01-05 MED ORDER — ESCITALOPRAM OXALATE 10 MG PO TABS: 10.0000 mg | ORAL_TABLET | Freq: Every day | ORAL | 2 refills | Status: DC

## 2024-01-05 MED ORDER — ATORVASTATIN CALCIUM 10 MG PO TABS: 10.0000 mg | ORAL_TABLET | Freq: Every day | ORAL | 0 refills | Status: DC

## 2024-01-05 NOTE — Assessment & Plan Note (Signed)
 142/93 off BP medication today. Restart benazapril, medication sent to pharmacy.

## 2024-01-05 NOTE — Patient Instructions (Addendum)
 Return in about 3 months (around 04/06/2024) for diabetes and hypertension.   Stop the Wellbutrin . Start taking Lexapro .  Remember to bring all of the medications that you take (including over the counter medications and supplements) with you to every clinic visit.  This after visit summary is an important review of tests, referrals, and medication changes that were discussed during your visit. If you have questions or concerns, call [phone]. Outside of clinic business hours, call the main hospital at [phone] and ask the operator for the on-call internal medicine resident.   Ozell Kung MD 01/05/2024, 10:39 AM

## 2024-01-05 NOTE — Assessment & Plan Note (Addendum)
 Anxiety symptoms predominate. She still has feelings of sadness and guilt, but I'm going to discontinue Wellbutrin  in favor of Lexapro  today. Cautioned about decreased libido. Recommended at least 4 weeks of therapy prior to discontinuing.     01/05/2024   10:20 AM 12/06/2023    1:53 PM 05/12/2022    4:00 PM  GAD 7 : Generalized Anxiety Score  Nervous, Anxious, on Edge 3 3 1   Control/stop worrying 3 3 3   Worry too much - different things 3 3 2   Trouble relaxing 3 2 1   Restless 3 0 1  Easily annoyed or irritable 3 1 0  Afraid - awful might happen 3 1 0  Total GAD 7 Score 21 13 8   Anxiety Difficulty Somewhat difficult Somewhat difficult

## 2024-01-05 NOTE — Assessment & Plan Note (Signed)
 Chronic. Check lipid panel today. Continue atorvastatin  10 mg daily, refill sent.

## 2024-01-05 NOTE — Assessment & Plan Note (Signed)
 Prior quit attempts: yes, quit when pregnant and while in between jobs, for up to two months Triggers and coping strategies: anxiety is trigger, she doesn't have other coping mechanisms Planned quit date: deferred Pharmacotherapy: deferred 3-10 minutes spent on counseling.

## 2024-01-05 NOTE — Progress Notes (Signed)
 Patient name: Shannon [name]  Date of birth: 03-02-1966 Date of visit: 01/05/24  Subjective   Kimika L Barreiro  is here for a periodic preventive medicine exam.  Review of general health and wellness concerns, lifestyle, and risk factors discussed.  She does have significant ongoing anxiety surrounding her increased responsibilities at work and illness in her family. Her symptoms are severe but not function limiting at present. Wellbutrin  doesn't seem to be helping, this was prescribed for depression. She continues to feel sad and tearful most days but notes anxiety is the most significant problem for her.  Allergies  Allergen Reactions   Shellfish Allergy Anaphylaxis    Shrimp    Current Outpatient Medications  Medication Instructions   albuterol (VENTOLIN HFA) 108 (90 Base) MCG/ACT inhaler SMARTSIG:2 Puff(s) By Mouth 4 Times Daily PRN   atorvastatin  (LIPITOR) 10 mg, Oral, Daily, Needs appt   benazepril  (LOTENSIN ) 10 MG tablet TAKE 1 TABLET(10 MG) BY MOUTH DAILY   benazepril  (LOTENSIN ) 10 MG tablet TAKE 1 TABLET(10 MG) BY MOUTH DAILY   benazepril  (LOTENSIN ) 10 MG tablet TAKE 1 TABLET(10 MG) BY MOUTH DAILY   benzonatate  (TESSALON  PERLES) 100 mg, Oral, Every 6 hours PRN   buPROPion  (WELLBUTRIN  SR) 150 MG 12 hr tablet TAKE 1 TABLET(150 MG) BY MOUTH DAILY   saxagliptin  HCl (ONGLYZA) 2.5 mg, Oral, Daily   Study - [id number] - bictegravir/emtrictabine/tenofovir  alafenamide (BIKTARVY ) 50-200-25 mg tablet (PI-[place]) 1 tablet, Oral, Daily, For Investigational Use Only. Take with or without food. Take at the same time each day. Bring back all bottles and study medicine diary to each visit.   Past Medical History:  Diagnosis Date   Arnold-Chiari malformation (HCC)    DVT (deep venous thrombosis) (HCC)    HIV disease (HCC)    Hypertension    Multiple thyroid  nodules    Fully functioning thyroid  with no treatment indicated   Nephrolithiasis 06/24/2015   Past Surgical History:   Procedure Laterality Date   ABDOMINAL HYSTERECTOMY     BRAIN SURGERY     CHOLECYSTECTOMY     Family History  Problem Relation Age of Onset   Asthma Mother    Hypertension Mother    Diabetes Mother    Diabetes Father    Cancer Father        colon at 43 yo   Breast cancer Maternal Aunt    Social History   Socioeconomic History   Marital status: Single    Spouse name: Not on file   Number of children: 1   Years of education: Not on file   Highest education level: Not on file  Occupational History   Occupation: Lawyer.  Tobacco Use   Smoking status: Every Day    Current packs/day: 0.00    Average packs/day: 0.5 packs/day for 25.0 years (12.5 ttl pk-yrs)    Types: Cigarettes    Start date: 03/24/1992    Last attempt to quit: 03/24/2017    Years since quitting: 6.7   Smokeless tobacco: Never   Tobacco comments:    Patient trying to quit  Vaping Use   Vaping status: Some Days  Substance and Sexual Activity   Alcohol use: Not Currently    Alcohol/week: 1.0 standard drink of alcohol    Types: 1 Standard drinks or equivalent per week    Comment: since dad passed 2-3 shots a day. but last week none   Drug use: No   Sexual activity: Not Currently    Birth control/protection: Surgical  Comment: declined condoms  Other Topics Concern   Not on file  Social History Narrative   Not on file   Social Drivers of Health   Financial Resource Strain: Not on file  Food Insecurity: Not on file  Transportation Needs: Not on file  Physical Activity: Not on file  Stress: Not on file  Social Connections: Unknown (10/18/2021)   Received from [facility]   Social Network    Social Network: Not on file  Intimate Partner Violence: Unknown (09/09/2021)   Received from [facility]   HITS    Physically Hurt: Not on file    Insult or Talk Down To: Not on file    Threaten Physical Harm: Not on file    Scream or Curse: Not on file    Review of Systems   Constitutional:  Negative for fever, malaise/fatigue and weight loss.  HENT:  Positive for tinnitus. Negative for hearing loss.   Eyes:        No changes to vision  Respiratory:  Negative for cough and shortness of breath.   Cardiovascular:  Negative for chest pain and palpitations.  Gastrointestinal:  Negative for abdominal pain and blood in stool.       No changes to bowel habits  Genitourinary:  Negative for hematuria.       No changes to urinary habits  Musculoskeletal:  Negative for falls.  Psychiatric/Behavioral:  Positive for depression. The patient is nervous/anxious. The patient does not have insomnia.      Objective  Today's Vitals   01/05/24 1001  BP: (!) 142/93  Pulse: 78  SpO2: 94%  Weight: 191 lb 12.8 oz (87 kg)  Height: 5' 3 (1.6 m)  Body mass index is 33.98 kg/m.   Physical Exam Constitutional:      General: She is not in acute distress.    Appearance: Normal appearance.  HENT:     Right Ear: Tympanic membrane, ear canal and external ear normal.     Left Ear: Tympanic membrane, ear canal and external ear normal.     Mouth/Throat:     Mouth: Mucous membranes are moist.     Pharynx: No oropharyngeal exudate or posterior oropharyngeal erythema.  Eyes:     Extraocular Movements: Extraocular movements intact.     Conjunctiva/sclera: Conjunctivae normal.     Pupils: Pupils are equal, round, and reactive to light.  Neck:     Thyroid : Thyromegaly present. No thyroid  mass or thyroid  tenderness.     Vascular: No carotid bruit.  Cardiovascular:     Rate and Rhythm: Normal rate and regular rhythm.     Pulses: Normal pulses.     Heart sounds: No murmur heard. Pulmonary:     Effort: Pulmonary effort is normal.     Breath sounds: Normal breath sounds. No wheezing or rales.  Abdominal:     Palpations: Abdomen is soft. There is no hepatomegaly, splenomegaly or mass.     Tenderness: There is no abdominal tenderness.  Musculoskeletal:     Right lower leg: No  edema.     Left lower leg: No edema.  Lymphadenopathy:     Cervical: No cervical adenopathy.  Skin:    General: Skin is warm and dry.  Neurological:     Mental Status: She is alert. Mental status is at baseline.     Cranial Nerves: No facial asymmetry.     Motor: No tremor.     Deep Tendon Reflexes: Reflexes normal.  Psychiatric:  Mood and Affect: Mood normal.        Behavior: Behavior normal.    Diabetic Foot Exam - Simple   Simple Foot Form Visual Inspection No deformities, no ulcerations, no other skin breakdown bilaterally: Yes Sensation Testing Intact to touch and monofilament testing bilaterally: Yes Pulse Check Posterior Tibialis and Dorsalis pulse intact bilaterally: Yes Comments      Assessment & Plan   Type 2 diabetes mellitus without complication, without long-term current use of insulin (HCC) Assessment & Plan: Chronic and stable. A1c 6.8 without medication. Encouraged ongoing weight loss. Follow-up in 3 months.  Orders: -     POCT glycosylated hemoglobin (Hb A1C) -     Microalbumin / creatinine urine ratio -     Glucose, capillary -     Ambulatory referral to Ophthalmology  Hyperlipidemia LDL goal <130 -     Atorvastatin  Calcium ; Take 1 tablet (10 mg total) by mouth daily. Needs appt  Dispense: 30 tablet; Refill: 0  Essential hypertension Assessment & Plan: 142/93 off BP medication today. Restart benazapril, medication sent to pharmacy.  Orders: -     Benazepril  HCl; TAKE 1 TABLET(10 MG) BY MOUTH DAILY  Dispense: 30 tablet; Refill: 3  Other depression Assessment & Plan: Anxiety symptoms predominate. She still has feelings of sadness and guilt, but I'm going to discontinue Wellbutrin  in favor of Lexapro  today. Cautioned about decreased libido. Recommended at least 4 weeks of therapy prior to discontinuing.     01/05/2024   10:20 AM 12/06/2023    1:53 PM 05/12/2022    4:00 PM  GAD 7 : Generalized Anxiety Score  Nervous, Anxious, on Edge 3 3 1    Control/stop worrying 3 3 3   Worry too much - different things 3 3 2   Trouble relaxing 3 2 1   Restless 3 0 1  Easily annoyed or irritable 3 1 0  Afraid - awful might happen 3 1 0  Total GAD 7 Score 21 13 8   Anxiety Difficulty Somewhat difficult Somewhat difficult       Orders: -     Escitalopram  Oxalate; Take 1 tablet (10 mg total) by mouth daily.  Dispense: 30 tablet; Refill: 2  TOBACCO ABUSE Assessment & Plan: Prior quit attempts: yes, quit when pregnant and while in between jobs, for up to two months Triggers and coping strategies: anxiety is trigger, she doesn't have other coping mechanisms Planned quit date: deferred Pharmacotherapy: deferred 3-10 minutes spent on counseling.    Hyperlipidemia LDL goal <100 Assessment & Plan: Chronic. Check lipid panel today. Continue atorvastatin  10 mg daily, refill sent.  Orders: -     Lipid panel  Thyromegaly Assessment & Plan: Chronic and stable on exam. Repeat TSH today.  Orders: -     TSH  Screen for colon cancer -     Ambulatory referral to Gastroenterology  Obesity (BMI 30-39.9) Assessment & Plan: Body mass index is 33.98 kg/m.  I recommend eliminating sugary beverages like soda, sweet tea, and juice entirely. I recommended more vegetables, lean protein, and legumes. Frozen vegetables are healthy and inexpensive. Beans are a healthy and inexpensive source of lean protein and fiber. I recommend gradually increasing exercise. A daily walk is a great way to start an exercise program.  Doing well with weight loss through diet and exercise. She's lost ~30 lbs in the last 2-3 years.  Referral: deferred  Pharmacological intervention: deferred      Return in about 3 months (around 04/06/2024) for diabetes and hypertension.  Ozell Kung MD 01/05/2024, 11:01 AM

## 2024-01-05 NOTE — Assessment & Plan Note (Signed)
 Chronic and stable on exam. Repeat TSH today.

## 2024-01-05 NOTE — Assessment & Plan Note (Signed)
 Chronic and stable. A1c 6.8 without medication. Encouraged ongoing weight loss. Follow-up in 3 months.

## 2024-01-05 NOTE — Assessment & Plan Note (Addendum)
 Body mass index is 33.98 kg/m.  I recommend eliminating sugary beverages like soda, sweet tea, and juice entirely. I recommended more vegetables, lean protein, and legumes. Frozen vegetables are healthy and inexpensive. Beans are a healthy and inexpensive source of lean protein and fiber. I recommend gradually increasing exercise. A daily walk is a great way to start an exercise program.  Doing well with weight loss through diet and exercise. She's lost ~30 lbs in the last 2-3 years.  Referral: deferred  Pharmacological intervention: deferred

## 2024-01-06 ENCOUNTER — Other Ambulatory Visit: Payer: Self-pay | Admitting: Student

## 2024-01-06 ENCOUNTER — Ambulatory Visit: Payer: Self-pay | Admitting: Student

## 2024-01-06 DIAGNOSIS — E785 Hyperlipidemia, unspecified: Secondary | ICD-10-CM

## 2024-01-06 LAB — LIPID PANEL
Chol/HDL Ratio: 3.7 ratio (ref 0.0–4.4)
Cholesterol, Total: 188 mg/dL (ref 100–199)
HDL: 51 mg/dL (ref >39)
LDL Chol Calc (NIH): 120 mg/dL — ABNORMAL HIGH (ref 0–99)
Triglycerides: 91 mg/dL (ref 0–149)
VLDL Cholesterol Cal: 17 mg/dL (ref 5–40)

## 2024-01-06 LAB — TSH: TSH: 0.518 u[IU]/mL (ref 0.450–4.500)

## 2024-01-06 LAB — MICROALBUMIN / CREATININE URINE RATIO
Creatinine, Urine: 58.6 mg/dL
Microalb/Creat Ratio: 17 mg/g{creat} (ref 0–29)
Microalbumin, Urine: 10 ug/mL

## 2024-01-06 MED ORDER — ATORVASTATIN CALCIUM 40 MG PO TABS: 40.0000 mg | ORAL_TABLET | Freq: Every day | ORAL | 3 refills | Status: AC

## 2024-01-06 MED ORDER — ATORVASTATIN CALCIUM 10 MG PO TABS: 20.0000 mg | ORAL_TABLET | Freq: Every day | ORAL | Status: DC

## 2024-01-06 NOTE — Telephone Encounter (Signed)
 Meds ordered this encounter  Medications   atorvastatin  (LIPITOR) 10 MG tablet    Sig: Take 2 tablets (20 mg total) by mouth daily.    **Patient requests 90 days supply**   atorvastatin  (LIPITOR) 40 MG tablet    Sig: Take 1 tablet (40 mg total) by mouth daily.    Dispense:  90 tablet    Refill:  3   Ozell Kung MD 01/06/2024, 2:16 PM

## 2024-01-06 NOTE — Telephone Encounter (Signed)
 Pharmacy requesting a 90 day supply  Medication sent to pharmacy.

## 2024-01-31 ENCOUNTER — Ambulatory Visit: Admitting: *Deleted

## 2024-01-31 ENCOUNTER — Other Ambulatory Visit: Payer: Self-pay

## 2024-01-31 ENCOUNTER — Encounter: Payer: Self-pay | Admitting: *Deleted

## 2024-01-31 VITALS — BP 137/92 | Wt 187.6 lb

## 2024-01-31 DIAGNOSIS — Z006 Encounter for examination for normal comparison and control in clinical research program: Secondary | ICD-10-CM

## 2024-01-31 DIAGNOSIS — Z23 Encounter for immunization: Secondary | ICD-10-CM

## 2024-01-31 MED ORDER — STUDY - MK-8591B-060 - BICTEGRAVIR/EMTRICITABINE/TENOFOVIR ALFENAMIDE (BIKTARVY) 50-200-25 MG TABLET (PI-VAN DAM): 1.0000 | ORAL_TABLET | Freq: Every day | ORAL | 0 refills | Status: DC

## 2024-01-31 NOTE — Progress Notes (Signed)
Hep B Vaccine given.

## 2024-01-31 NOTE — Research (Signed)
 Shannon [name] here today for her Week 12 visit for the [employer] study, [id number] A Phase 2b, Randomized, Active-Controlled, Open-Label Clinical Study to Evaluate a Switch to Islatravir (ISL) and Ulonivirine (ULO) Once Weekly in Adults with HIV-1 Virologically Suppressed on Bictefravir/Emtricitabine /Tenofovir  Alafinamide (BIC/FTC/TAF) Once Daily . All study procedure completed per protocol. Study IP dispensed. She will return in September for her next study visit.

## 2024-02-08 NOTE — Progress Notes (Signed)
 Internal Medicine Clinic Attending  Case discussed with the resident at the time of the visit.  We reviewed the resident's history and exam and pertinent patient test results.  I agree with the assessment, diagnosis, and plan of care documented in the resident's note.

## 2024-02-28 ENCOUNTER — Encounter: Admitting: *Deleted

## 2024-02-28 ENCOUNTER — Ambulatory Visit (INDEPENDENT_AMBULATORY_CARE_PROVIDER_SITE_OTHER): Admitting: *Deleted

## 2024-02-28 ENCOUNTER — Other Ambulatory Visit: Payer: Self-pay

## 2024-02-28 DIAGNOSIS — Z23 Encounter for immunization: Secondary | ICD-10-CM

## 2024-02-28 DIAGNOSIS — Z006 Encounter for examination for normal comparison and control in clinical research program: Secondary | ICD-10-CM

## 2024-02-28 MED ORDER — STUDY - MK-8591B-060 - BICTEGRAVIR/EMTRICITABINE/TENOFOVIR ALFENAMIDE (BIKTARVY) 50-200-25 MG TABLET (PI-VAN DAM): 1.0000 | ORAL_TABLET | Freq: Every day | ORAL | 0 refills | Status: DC

## 2024-02-28 NOTE — Research (Unsigned)
 Dezi here today for her Week 16 visit for the Merck study, [id number] A Phase 2b, Randomized, Active-Controlled, Open-Label Clinical Study to Evaluate a Switch to Islatravir (ISL) and Ulonivirine (ULO) Once Weekly in Adults with HIV-1 Virologically Suppressed on Bictefravir/Emtricitabine /Tenofovir  Alafinamide (BIC/FTC/TAF) Once Daily . All study procedure completed per protocol. Study IP dispensed. She will return in November for her next study visit.

## 2024-02-29 ENCOUNTER — Ambulatory Visit

## 2024-03-01 ENCOUNTER — Ambulatory Visit

## 2024-03-26 ENCOUNTER — Other Ambulatory Visit: Payer: Self-pay | Admitting: Student

## 2024-03-26 DIAGNOSIS — F3289 Other specified depressive episodes: Secondary | ICD-10-CM

## 2024-04-19 ENCOUNTER — Encounter: Admitting: *Deleted

## 2024-04-21 ENCOUNTER — Encounter: Admitting: *Deleted

## 2024-04-21 ENCOUNTER — Other Ambulatory Visit: Payer: Self-pay

## 2024-04-21 DIAGNOSIS — Z006 Encounter for examination for normal comparison and control in clinical research program: Secondary | ICD-10-CM

## 2024-04-21 MED ORDER — STUDY - MK-8591B-060 - BICTEGRAVIR/EMTRICITABINE/TENOFOVIR ALFENAMIDE (BIKTARVY) 50-200-25 MG TABLET (PI-VAN DAM): 1.0000 | ORAL_TABLET | Freq: Every day | ORAL | 0 refills | Status: AC

## 2024-04-21 NOTE — Research (Signed)
 Shannon [name] here today for her Week 16 visit for the Merck study, [id number] A Phase 2b, Randomized, Active-Controlled, Open-Label Clinical Study to Evaluate a Switch to Islatravir (ISL) and Ulonivirine (ULO) Once Weekly in Adults with HIV-1 Virologically Suppressed on Bictefravir/Emtricitabine /Tenofovir  Alafinamide (BIC/FTC/TAF) Once Daily. Reconsent required, I explained/reviewed the consent form with him. Risks, responsibilities, benefits, and other options were reviewed. Verbalized understanding and denied any questions at this time. Signed informed consent was obtained and witnessed by me. Informed consent was obtained prior to any procedures being done.Continues to have loose stools daily 1-3/day since September. She thought is was related to the dosing change with the atorvastatin . States that she has only been taking 20mg  but has not had any improvement in her symptoms. Instructed to make an appointment with her PCP. All study procedure completed per protocol. Study IP dispensed. She will return in February for her next study visit.

## 2024-04-24 ENCOUNTER — Ambulatory Visit: Admitting: Student

## 2024-04-27 ENCOUNTER — Ambulatory Visit: Admitting: Student

## 2024-04-27 NOTE — Assessment & Plan Note (Deleted)
 Shannon [name]

## 2024-04-27 NOTE — Assessment & Plan Note (Deleted)
 SABRA

## 2024-04-27 NOTE — Progress Notes (Deleted)
  Patient name: Shannon [name]  Date of birth: Sep 26, 1965 Date of visit: 04/27/24  Subjective:  Reason for visit: No chief complaint on file.  Depression          Current Outpatient Medications  Medication Instructions   atorvastatin  (LIPITOR) 20 mg, Oral, Daily   atorvastatin  (LIPITOR) 40 mg, Oral, Daily   benazepril  (LOTENSIN ) 10 MG tablet TAKE 1 TABLET(10 MG) BY MOUTH DAILY   escitalopram  (LEXAPRO ) 10 MG tablet TAKE 1 TABLET(10 MG) BY MOUTH DAILY   Study - [id number] - bictegravir/emtrictabine/tenofovir  alafenamide (BIKTARVY ) 50-200-25 mg tablet (PI-[place]) 1 tablet, Oral, Daily, For Investigational Use Only. Take with or without food. Take at the same time each day. Bring back all bottles and study medicine diary to each visit.    Objective: There were no vitals filed for this visit.There is no height or weight on file to calculate BMI.   Physical Exam  Assessment & Plan TOBACCO ABUSE     Severe episode of recurrent major depressive disorder, without psychotic features (HCC)     Essential hypertension     Type 2 diabetes mellitus without complication, without long-term current use of insulin (HCC)      No follow-ups on file.  Ozell Kung MD 04/27/2024, 8:25 AM

## 2024-05-03 ENCOUNTER — Other Ambulatory Visit

## 2024-05-17 ENCOUNTER — Ambulatory Visit (INDEPENDENT_AMBULATORY_CARE_PROVIDER_SITE_OTHER): Payer: Self-pay | Admitting: Infectious Diseases

## 2024-05-17 ENCOUNTER — Other Ambulatory Visit: Payer: Self-pay

## 2024-05-17 ENCOUNTER — Encounter: Payer: Self-pay | Admitting: Infectious Diseases

## 2024-05-17 VITALS — BP 143/90 | HR 80 | Temp 96.9°F | Ht 63.0 in | Wt 193.0 lb

## 2024-05-17 DIAGNOSIS — Z0000 Encounter for general adult medical examination without abnormal findings: Secondary | ICD-10-CM

## 2024-05-17 DIAGNOSIS — Z79899 Other long term (current) drug therapy: Secondary | ICD-10-CM | POA: Diagnosis not present

## 2024-05-17 DIAGNOSIS — B20 Human immunodeficiency virus [HIV] disease: Secondary | ICD-10-CM

## 2024-05-17 DIAGNOSIS — K625 Hemorrhage of anus and rectum: Secondary | ICD-10-CM

## 2024-05-17 DIAGNOSIS — I10 Essential (primary) hypertension: Secondary | ICD-10-CM | POA: Diagnosis not present

## 2024-05-17 DIAGNOSIS — R42 Dizziness and giddiness: Secondary | ICD-10-CM

## 2024-05-17 DIAGNOSIS — F17200 Nicotine dependence, unspecified, uncomplicated: Secondary | ICD-10-CM

## 2024-05-17 NOTE — Patient Instructions (Addendum)
°  Record your blood pressure -   After your birthday vaccines to consider getting:  Prevnar 20 (last pneumonia shot) Shingles vaccines - 2 doses to prevent shingles outbreaks  Flu vaccine   Please call the [facility] to set up your mammogram.  [phone] [address], [address] [place], [place] [address]  Please call to schedule your colonoscopy  [facility] Located in: [facility] [address] Address: [address], [place], [place] [address] Phone: [phone]

## 2024-05-17 NOTE — Progress Notes (Signed)
 Name: Shannon [name]   DOB: [birth year] MRN: [id number] PCP: Norrine Sharper, MD    Brief Narrative:  Shannon [name]  is a 58 y.o. female with HIV, Stage 2, diagnosed on 2000. CD4 nadir unknown  VL unknown Transmission Risk: sexual History of OIs: Shingles 2018 History of STIs: Hep B sAg (-), sAb (-), cAb (-); Hep A (+), Hep C (-) Quantiferon (-) HLA B*5701 () G6PD: ()  Previous Regimens: Atripla 2012 [place]  2016  Genotypes: 2012 - Atripla    Subjective  No chief complaint on file.    Discussed the use of AI scribe software for clinical note transcription with the patient, who gave verbal consent to proceed.  History of Present Illness   Shannon [name]  is a 58 year old female with HIV who presents for routine follow-up care.  She continues to take Biktarvy  daily, maintaining an undetectable viral load with a CD4 count above 500. She is also enrolled in the Merck switch study research trial.  She experienced episodes of elevated blood pressure and rectal bleeding at work, both occurring once. She did not feel stressed or sick at the time, and these episodes were unexpected. She drove herself home after the episodes, feeling unnerved but not suspecting a heart attack or stroke. She does not have a way to check her blood pressure at home. No recent use of allergy or cold medications.  Regarding the rectal bleeding, it was confirmed to be from the rectum and not the vagina. She experienced prolonged diarrhea before the bleeding, which was not painful. She has a history of hemorrhoids but did not believe this episode was related to them. She typically has one bowel movement a day or every other day, but during this period, she was going two to three times a day. Her father had colon cancer.  She is currently taking Lexapro  for anxiety, which she finds favorable despite some sexual side effects. She had difficulty achieving orgasm before starting the medication,  and while it remains challenging, orgasms are now possible. No discomfort or dryness during intercourse.          05/17/2024    2:01 PM  Depression screen PHQ 2/9  Decreased Interest 0  Down, Depressed, Hopeless 0  PHQ - 2 Score 0  Altered sleeping 0  Tired, decreased energy 0  Change in appetite 0  Feeling bad or failure about yourself  0  Trouble concentrating 0  Moving slowly or fidgety/restless 0  Suicidal thoughts 0  PHQ-9 Score 0    Review of Systems  Constitutional:  Negative for chills, fever, malaise/fatigue and weight loss.  HENT:  Negative for sore throat.        No dental problems  Respiratory:  Negative for cough and sputum production.   Cardiovascular:  Negative for chest pain and leg swelling.  Gastrointestinal:  Negative for abdominal pain, diarrhea and vomiting.  Genitourinary:  Negative for dysuria and flank pain.  Musculoskeletal:  Negative for joint pain, myalgias and neck pain.  Skin:  Negative for rash.  Neurological:  Negative for dizziness, tingling and headaches.  Psychiatric/Behavioral:  Negative for depression and substance abuse. The patient is not nervous/anxious and does not have insomnia.     Past Medical History:  Diagnosis Date   Arnold-Chiari malformation (HCC)    DVT (deep venous thrombosis) (HCC)    HIV disease (HCC)    Hypertension    Multiple thyroid  nodules    Fully functioning thyroid  with no treatment indicated  Nephrolithiasis 06/24/2015    Outpatient Medications Prior to Visit  Medication Sig Dispense Refill   atorvastatin  (LIPITOR) 40 MG tablet Take 1 tablet (40 mg total) by mouth daily. 90 tablet 3   benazepril  (LOTENSIN ) 10 MG tablet TAKE 1 TABLET(10 MG) BY MOUTH DAILY 30 tablet 3   escitalopram  (LEXAPRO ) 10 MG tablet TAKE 1 TABLET(10 MG) BY MOUTH DAILY 30 tablet 2   Study - [id number] - bictegravir/emtrictabine/tenofovir  alafenamide (BIKTARVY ) 50-200-25 mg tablet (PI-[place]) Take 1 tablet by mouth daily. For  Investigational Use Only. Take with or without food. Take at the same time each day. Bring back all bottles and study medicine diary to each visit. 105 tablet 0   atorvastatin  (LIPITOR) 10 MG tablet Take 2 tablets (20 mg total) by mouth daily.     No facility-administered medications prior to visit.     Allergies  Allergen Reactions   Shellfish Allergy Anaphylaxis    Shrimp     Social History   Tobacco Use   Smoking status: Every Day    Current packs/day: 0.00    Average packs/day: 0.5 packs/day for 25.0 years (12.5 ttl pk-yrs)    Types: Cigarettes    Start date: 03/24/1992    Last attempt to quit: 03/24/2017    Years since quitting: 7.1   Smokeless tobacco: Never   Tobacco comments:    Patient trying to quit  Vaping Use   Vaping status: Some Days  Substance Use Topics   Alcohol use: Not Currently    Alcohol/week: 1.0 standard drink of alcohol    Types: 1 Standard drinks or equivalent per week    Comment: since dad passed 2-3 shots a day. but last week none   Drug use: No    Social History   Substance and Sexual Activity  Sexual Activity Not Currently   Birth control/protection: Surgical   Comment: declined condoms        Objective  Vitals:   05/17/24 1359 05/17/24 1402  BP: (!) 164/81 (!) 143/90  Pulse: 80   Temp: (!) 96.9 F (36.1 C)   TempSrc: Temporal   SpO2: 97%   Weight: 193 lb (87.5 kg)   Height: 5' 3 (1.6 m)    Body mass index is 34.19 kg/m.  Physical Exam Vitals reviewed.  Constitutional:      Appearance: Normal appearance. She is not ill-appearing.  HENT:     Mouth/Throat:     Mouth: Mucous membranes are moist.     Pharynx: Oropharynx is clear.  Eyes:     General: No scleral icterus. Cardiovascular:     Rate and Rhythm: Normal rate.  Pulmonary:     Effort: Pulmonary effort is normal.  Neurological:     Mental Status: She is oriented to person, place, and time.  Psychiatric:        Mood and Affect: Mood normal.        Thought  Content: Thought content normal.        Assessment and Plan    Human immunodeficiency virus (HIV) disease - HIV is well-controlled with undetectable viral load and CD4 count above 500. She is enrolled in the Merck switch study and continues on Biktarvy  daily.  - Continue Biktarvy  daily - Continue participation in Merck switch study - Mammogram info provided to schedule  - Does not need pap smears anymore with hysterectomy  - Vaccines discussed for her to get at her ability (Prevnar 20, Flu, Shingles)  - RTC in 35m  Rectal bleeding  x 1, ?secondary to internal hemorrhoids in setting of diarrhea - Recent episode of rectal bleeding likely due to internal hemorrhoids exacerbated by prolonged diarrhea. Family history of colon cancer noted. - Schedule colonoscopy due to family history of colon cancer - Monitor for recurrence of rectal bleeding  HTN / Dizziness / headache episode x 1 -  We talked about getting more information with home measurements. She will use her FSA funds to help with a blood pressure cuff. Recommended automated one on the arm not wrist.  Unclear the cause of her symptoms at that time.      No orders of the defined types were placed in this encounter.   No orders of the defined types were placed in this encounter.   Return in about 6 months (around 11/15/2024). Lab results reviewed from research encounters the last 2 months.   Corean Fireman, MSN, NP-C [facility]  [email or web address] Pager: [phone] Office: [phone] [facility] Main Line: [phone] *Secure Chat Communication Welcome

## 2024-05-17 NOTE — Progress Notes (Signed)
 Patient: Shannon [name]   DOB: [birth year] MRN: [id number] PCP: Norrine Sharper, MD  Referring Provider:   Reason for Visit: 65-month follow up  No chief complaint on file.     Subjective   Subjective:   Shannon [name]  is a 58 year old female presenting to clinic for 45-month follow up on HIV management. She is in the [id number] research study. Last viral load was <20 and CD4 2753. In office today BP was 143/90. She reports daily adherence, no missed doses and no barriers in obtaining medication. She reports being compliant with medication. She reports she had an incident last month where her BP was 200/90 and felt dizzy and drove home. She did no go to the ER at that time. She does not take BP at home and does not have BP machine. She also reports approximately two months ago, she had three episodes of BRBPR while having diarrhea. She states she took Pepto Bismol with no improvement. She did not get seen for this and states symptoms eventually went away. She recently switch to Lexapro  for anxiety for which she states has helped a lot. She states she used to bite the inside for her cheek but since starting Lexapro  she has not noticed doing it. She denies fever, chills, night sweats, chest pain, shortness of breath, headaches, vision changes and rashes/lesions.   Review of Systems  Constitutional: Negative.   Respiratory: Negative.    Cardiovascular: Negative.   Gastrointestinal: Negative.   Genitourinary: Negative.   Musculoskeletal: Negative.   Skin: Negative.   Neurological: Negative.   Psychiatric/Behavioral: Negative.      Past Medical History:  Diagnosis Date   Arnold-Chiari malformation (HCC)    DVT (deep venous thrombosis) (HCC)    HIV disease (HCC)    Hypertension    Multiple thyroid  nodules    Fully functioning thyroid  with no treatment indicated   Nephrolithiasis 06/24/2015    Outpatient Medications Prior to Visit  Medication Sig Dispense Refill    atorvastatin  (LIPITOR) 40 MG tablet Take 1 tablet (40 mg total) by mouth daily. 90 tablet 3   benazepril  (LOTENSIN ) 10 MG tablet TAKE 1 TABLET(10 MG) BY MOUTH DAILY 30 tablet 3   escitalopram  (LEXAPRO ) 10 MG tablet TAKE 1 TABLET(10 MG) BY MOUTH DAILY 30 tablet 2   Study - [id number] - bictegravir/emtrictabine/tenofovir  alafenamide (BIKTARVY ) 50-200-25 mg tablet ([place]) Take 1 tablet by mouth daily. For Investigational Use Only. Take with or without food. Take at the same time each day. Bring back all bottles and study medicine diary to each visit. 105 tablet 0   atorvastatin  (LIPITOR) 10 MG tablet Take 2 tablets (20 mg total) by mouth daily.     No facility-administered medications prior to visit.     Allergies  Allergen Reactions   Shellfish Allergy Anaphylaxis    Shrimp     Social History   Tobacco Use   Smoking status: Every Day    Current packs/day: 0.00    Average packs/day: 0.5 packs/day for 25.0 years (12.5 ttl pk-yrs)    Types: Cigarettes    Start date: 03/24/1992    Last attempt to quit: 03/24/2017    Years since quitting: 7.1   Smokeless tobacco: Never   Tobacco comments:    Patient trying to quit  Vaping Use   Vaping status: Some Days  Substance Use Topics   Alcohol use: Not Currently    Alcohol/week: 1.0 standard drink of alcohol    Types: 1 Standard drinks or  equivalent per week    Comment: since dad passed 2-3 shots a day. but last week none   Drug use: No    Family History  Problem Relation Age of Onset   Asthma Mother    Hypertension Mother    Diabetes Mother    Diabetes Father    Cancer Father        colon at 95 yo   Breast cancer Maternal Aunt        Objective   Objective:   Vitals:   05/17/24 1359 05/17/24 1402  BP: (!) 164/81 (!) 143/90  Pulse: 80   Temp: (!) 96.9 F (36.1 C)   TempSrc: Temporal   SpO2: 97%   Weight: 193 lb (87.5 kg)   Height: 5' 3 (1.6 m)    Body mass index is 34.19 kg/m.  Physical  Exam Constitutional:      Appearance: Normal appearance.  Cardiovascular:     Rate and Rhythm: Normal rate and regular rhythm.     Heart sounds: Normal heart sounds.  Pulmonary:     Effort: Pulmonary effort is normal.     Breath sounds: Normal breath sounds.  Musculoskeletal:        General: Normal range of motion.  Neurological:     Mental Status: She is alert and oriented to person, place, and time.  Psychiatric:        Mood and Affect: Mood normal.        Behavior: Behavior normal.        Thought Content: Thought content normal.        Judgment: Judgment normal.        Assessment & Plan:   Problem List Items Addressed This Visit       Cardiovascular and Mediastinum   Essential hypertension     Other   Human immunodeficiency virus (HIV) disease (HCC) - Primary   TOBACCO ABUSE   Other Visit Diagnoses       Healthcare maintenance         Rectal bleeding           Assessment and Plan  -Human immunodeficiency virus (HIV) disease She is in the [id number] research study. Last viral load <20 and CD4 2753. She reports daily adherence, no missed doses, and adverse effects. She reports no barriers in obtaining medication. Continue with current regimen.   -Tobacco Abuse She continues to smoke 1/2 pack of cigarettes a day. Smoking cessation encouraged.   -Essential Hypertension BP in office was 143/90. Current regimen includes daily benazepril  10 mg. She reports compliance with no adverse effects. She reports one incident where BP was 200/90 last month, felt dizzy and drive home. She does not check BP at home and does not own a BP machine home. Will provide information on obtaining BP machine.   -Rectal Bleeding She reports no further episodes of bleeding. She reports family history of colon cancer. Will provide information to schedule colonoscopy. Continue to monitor for reoccurrence.   -Healthcare maintenance  She is sexually active with one female partner. She  declines STI screening at this time. Preventive care reviewed and will provide information to schedule mammogram. She has had a hysterectomy, no longer need pap smears. Immunizations reviewed. Offered Prevnar 20, flu, and shingles. Will consider vaccines at next visit.        No orders of the defined types were placed in this encounter.   No orders of the defined types were placed in this encounter.   Return in  about 6 months (around 11/15/2024).

## 2024-07-12 ENCOUNTER — Encounter: Payer: Self-pay | Admitting: *Deleted

## 2024-07-17 ENCOUNTER — Encounter: Admitting: *Deleted
